# Patient Record
Sex: Female | Born: 1950
Health system: Southern US, Community
[De-identification: ages and names within clinical notes are randomized; demographics above are authoritative.]

## PROBLEM LIST (undated history)

## (undated) DIAGNOSIS — F419 Anxiety disorder, unspecified: Secondary | ICD-10-CM

## (undated) DIAGNOSIS — N649 Disorder of breast, unspecified: Secondary | ICD-10-CM

## (undated) DIAGNOSIS — C439 Malignant melanoma of skin, unspecified: Secondary | ICD-10-CM

## (undated) DIAGNOSIS — I447 Left bundle-branch block, unspecified: Secondary | ICD-10-CM

## (undated) DIAGNOSIS — S50311A Abrasion of right elbow, initial encounter: Secondary | ICD-10-CM

## (undated) DIAGNOSIS — Z9889 Other specified postprocedural states: Secondary | ICD-10-CM

## (undated) DIAGNOSIS — R112 Nausea with vomiting, unspecified: Secondary | ICD-10-CM

## (undated) DIAGNOSIS — J45909 Unspecified asthma, uncomplicated: Secondary | ICD-10-CM

## (undated) DIAGNOSIS — I421 Obstructive hypertrophic cardiomyopathy: Secondary | ICD-10-CM

## (undated) DIAGNOSIS — I10 Essential (primary) hypertension: Secondary | ICD-10-CM

## (undated) DIAGNOSIS — N6452 Nipple discharge: Secondary | ICD-10-CM

## (undated) DIAGNOSIS — E7800 Pure hypercholesterolemia, unspecified: Secondary | ICD-10-CM

## (undated) DIAGNOSIS — G545 Neuralgic amyotrophy: Principal | ICD-10-CM

## (undated) DIAGNOSIS — L989 Disorder of the skin and subcutaneous tissue, unspecified: Secondary | ICD-10-CM

## (undated) HISTORY — DX: Malignant melanoma of skin, unspecified: C43.9

## (undated) HISTORY — DX: Anxiety disorder, unspecified: F41.9

## (undated) HISTORY — PX: BREAST EXCISIONAL BIOPSY: SUR124

## (undated) HISTORY — DX: Obstructive hypertrophic cardiomyopathy: I42.1

## (undated) HISTORY — PX: APPENDECTOMY: SHX54

## (undated) HISTORY — DX: Neuralgic amyotrophy: G54.5

---

## 1998-06-16 ENCOUNTER — Other Ambulatory Visit: Admission: RE | Admit: 1998-06-16 | Discharge: 1998-06-16 | Payer: Self-pay | Admitting: Obstetrics & Gynecology

## 1999-05-10 ENCOUNTER — Encounter: Admission: RE | Admit: 1999-05-10 | Discharge: 1999-05-10 | Payer: Self-pay | Admitting: Obstetrics & Gynecology

## 1999-05-10 ENCOUNTER — Encounter: Payer: Self-pay | Admitting: Obstetrics & Gynecology

## 1999-06-21 ENCOUNTER — Other Ambulatory Visit: Admission: RE | Admit: 1999-06-21 | Discharge: 1999-06-21 | Payer: Self-pay | Admitting: Obstetrics & Gynecology

## 2000-06-14 ENCOUNTER — Encounter: Admission: RE | Admit: 2000-06-14 | Discharge: 2000-06-14 | Payer: Self-pay | Admitting: Internal Medicine

## 2000-06-14 ENCOUNTER — Encounter: Payer: Self-pay | Admitting: Internal Medicine

## 2000-08-15 ENCOUNTER — Other Ambulatory Visit: Admission: RE | Admit: 2000-08-15 | Discharge: 2000-08-15 | Payer: Self-pay | Admitting: Obstetrics & Gynecology

## 2001-06-18 ENCOUNTER — Encounter: Payer: Self-pay | Admitting: Obstetrics & Gynecology

## 2001-06-18 ENCOUNTER — Encounter: Admission: RE | Admit: 2001-06-18 | Discharge: 2001-06-18 | Payer: Self-pay | Admitting: Obstetrics & Gynecology

## 2001-12-18 ENCOUNTER — Other Ambulatory Visit: Admission: RE | Admit: 2001-12-18 | Discharge: 2001-12-18 | Payer: Self-pay | Admitting: Obstetrics & Gynecology

## 2002-12-14 ENCOUNTER — Encounter: Payer: Self-pay | Admitting: Obstetrics & Gynecology

## 2002-12-14 ENCOUNTER — Encounter: Admission: RE | Admit: 2002-12-14 | Discharge: 2002-12-14 | Payer: Self-pay | Admitting: Obstetrics & Gynecology

## 2003-02-25 ENCOUNTER — Other Ambulatory Visit: Admission: RE | Admit: 2003-02-25 | Discharge: 2003-02-25 | Payer: Self-pay | Admitting: Obstetrics & Gynecology

## 2003-07-26 ENCOUNTER — Ambulatory Visit (HOSPITAL_COMMUNITY): Admission: RE | Admit: 2003-07-26 | Discharge: 2003-07-26 | Payer: Self-pay | Admitting: Gastroenterology

## 2004-03-16 ENCOUNTER — Other Ambulatory Visit: Admission: RE | Admit: 2004-03-16 | Discharge: 2004-03-16 | Payer: Self-pay | Admitting: Obstetrics & Gynecology

## 2004-07-21 ENCOUNTER — Encounter (INDEPENDENT_AMBULATORY_CARE_PROVIDER_SITE_OTHER): Payer: Self-pay | Admitting: *Deleted

## 2004-07-21 ENCOUNTER — Ambulatory Visit (HOSPITAL_COMMUNITY): Admission: RE | Admit: 2004-07-21 | Discharge: 2004-07-21 | Payer: Self-pay | Admitting: Obstetrics & Gynecology

## 2004-07-21 HISTORY — PX: HYSTEROSCOPY WITH D & C: SHX1775

## 2005-05-16 ENCOUNTER — Other Ambulatory Visit: Admission: RE | Admit: 2005-05-16 | Discharge: 2005-05-16 | Payer: Self-pay | Admitting: Obstetrics & Gynecology

## 2006-12-31 ENCOUNTER — Encounter: Admission: RE | Admit: 2006-12-31 | Discharge: 2006-12-31 | Payer: Self-pay | Admitting: Obstetrics & Gynecology

## 2007-06-30 ENCOUNTER — Encounter: Admission: RE | Admit: 2007-06-30 | Discharge: 2007-06-30 | Payer: Self-pay | Admitting: Obstetrics & Gynecology

## 2007-07-11 ENCOUNTER — Encounter: Admission: RE | Admit: 2007-07-11 | Discharge: 2007-07-11 | Payer: Self-pay | Admitting: Obstetrics & Gynecology

## 2008-06-08 ENCOUNTER — Encounter: Admission: RE | Admit: 2008-06-08 | Discharge: 2008-06-08 | Payer: Self-pay | Admitting: Obstetrics & Gynecology

## 2008-07-12 ENCOUNTER — Encounter: Admission: RE | Admit: 2008-07-12 | Discharge: 2008-07-12 | Payer: Self-pay | Admitting: Obstetrics & Gynecology

## 2009-07-15 ENCOUNTER — Encounter: Admission: RE | Admit: 2009-07-15 | Discharge: 2009-07-15 | Payer: Self-pay | Admitting: Obstetrics & Gynecology

## 2009-07-21 ENCOUNTER — Encounter: Admission: RE | Admit: 2009-07-21 | Discharge: 2009-07-21 | Payer: Self-pay | Admitting: Obstetrics & Gynecology

## 2010-07-17 ENCOUNTER — Encounter
Admission: RE | Admit: 2010-07-17 | Discharge: 2010-07-17 | Payer: Self-pay | Source: Home / Self Care | Attending: Obstetrics & Gynecology | Admitting: Obstetrics & Gynecology

## 2010-11-24 NOTE — Op Note (Signed)
[name], [name]                         ACCOUNT NO.:  [id number]   MEDICAL RECORD NO.:  [id number]                   PATIENT TYPE:  AMB   LOCATION:  ENDO                                 FACILITY:  [facility]   PHYSICIAN:  Danise Edge, M.D.                DATE OF BIRTH:  07-Jul-1951   DATE OF PROCEDURE:  07/26/2003  DATE OF DISCHARGE:                                 OPERATIVE REPORT   PROCEDURE:  Screening colonoscopy.   PROCEDURE INDICATION:  Janet [name] is a 60 year old female born [birth year].  Janet [name] is scheduled to undergo her first screening colonoscopy  with polypectomy to prevent colon cancer.   ENDOSCOPIST:  Danise Edge, M.D.   PREMEDICATION:  Versed 7 mg, Demerol 50 mg.   DESCRIPTION OF PROCEDURE:  After obtaining informed consent, Janet [name] was  placed in the left lateral decubitus position.  I administered intravenous  Demerol and intravenous Versed to achieve conscious sedation for the  procedure.  The patient's blood pressure, oxygen saturation, and cardiac  rhythm were monitored throughout the procedure and documented in the medical  record.   Anal inspection was normal.  Digital rectal exam was normal.  The Olympus  adjustable pediatric colonoscope was introduced into the rectum and advanced  to the cecum.  A normal-appearing ileocecal valve was intubated and the  distal ileum inspected.  Colonic preparation for the exam today was  excellent.   Rectum normal.   Sigmoid colon and descending colon normal.   Splenic flexure normal.   Transverse colon normal.   Hepatic flexure normal.   Ascending colon normal.   Cecum and ileocecal valve normal.   Distal ileum normal.   ASSESSMENT:  Normal screening proctocolonoscopy to the cecum.  No endoscopic  evidence for the presence of colorectal neoplasia.                                               Danise Edge, M.D.    MJ/MEDQ  D:  07/26/2003  T:  07/26/2003  Job:  [id number]   cc:    Dellis Anes. Idell Pickles, M.D.  [address]  [place]  [place] [address]  Fax: [phone]   W. Varney Baas, M.D.  [address] [place]  [place] [address]  Fax: [phone]   Candyce Churn, M.D.  [address] [place]  [place] [address]  Fax: [phone]

## 2010-11-24 NOTE — Op Note (Signed)
NAMEALLSION, NOGALES               ACCOUNT NO.:  [id number]   MEDICAL RECORD NO.:  [id number]          PATIENT TYPE:  AMB   LOCATION:  SDC                           FACILITY:  [facility]   PHYSICIAN:  Freddy Finner, M.D.   DATE OF BIRTH:  03/12/1951   DATE OF PROCEDURE:  07/21/2004  DATE OF DISCHARGE:                                 OPERATIVE REPORT   PREOPERATIVE DIAGNOSES:  Submucous myoma, endometrial polyp.   POSTOPERATIVE DIAGNOSES:  Submucous myoma, endometrial polyp.   OPERATION:  Hysteroscopy D&C, submucous myomectomy using the single loop  resectoscope.   SURGEON:  Freddy Finner, M.D.   ANESTHESIA:  General.   ESTIMATED BLOOD LOSS:  Less than or equal to 100 mL.   COMPLICATIONS:  None.   SORBITOL DEFICIT:  150 mL.   The patient is the 60 year old wife of Dr. Foye Deer who had dysfunctional  uterine bleeding on hormone replacement therapy and post coital bleeding.  She had a sonohysterogram in the office showing an endometrial polyp and  submucous myoma.  Myoma measured 2 cm x 1.5 cm and polyp measured 9 mm. She  was admitted now for hysteroscopy D&C.   She was admitted on the morning of surgery, she was given a gram of Mefoxin  preoperatively, she was brought to the operating room and there placed under  adequate general anesthesia, placed in the dorsal lithotomy position using  the [place] stirrup system. A Betadine prep of mons, perineum and vagina was  carried out in the standard fashion.  Sterile drapes were applied. A  posterior weighted vaginal retractor was placed, Deaver retractor was used  anteriorly, cervix was visualized, grasped with a single tooth tenaculum.  The cervix was initially dilated to 23 with [facility] dilators. The uterus  sounded to 8.3 cm.  The [facility] 12.5 degree hysteroscope was introduced with a  diagnostic scope only using 3% sorbitol as a distending medium.  The polyp  and fibroid were immediately identified, photographs were made and retained  in the office records as well as a copy sent with the patient's husband.  A  third curettage and exploration with polyp forceps was carried out  initially. This did not allow retrieval of either the polyp or the myoma.  The resectoscope was assembled using a single loop.  With progressive passes  of the loop, the fibroid and polyp were excised from the endometrium,  samples collected and submitted together as one sample.  Photographs  postoperatively of the cavity were carried out. The patient tolerated the  operative procedure well, statistics are noted above.  She was awakened and  taken to the recovery room in good condition and will be discharged in the  immediate postop period for followup in the office in approximately 7-10  days. She was given  routine outpatient surgical instructions. She has hydrocodone at home which  she can use as needed for postoperative pain. She was given Toradol 30 mg IV  and 30 IM immediately on completion of the procedure.  She was taken to the  recovery room in good condition.  W. R   WRN/MEDQ  D:  07/21/2004  T:  07/21/2004  Job:  [id number]

## 2011-06-08 ENCOUNTER — Ambulatory Visit
Admission: RE | Admit: 2011-06-08 | Discharge: 2011-06-08 | Disposition: A | Discharge status: Home / Self Care | Payer: BC Managed Care – PPO | Source: Ambulatory Visit | Attending: Family Medicine | Admitting: Family Medicine

## 2011-06-08 ENCOUNTER — Other Ambulatory Visit: Payer: Self-pay | Admitting: Family Medicine

## 2011-06-08 DIAGNOSIS — M79672 Pain in left foot: Secondary | ICD-10-CM

## 2011-08-03 ENCOUNTER — Other Ambulatory Visit: Payer: Self-pay | Admitting: Obstetrics & Gynecology

## 2011-08-03 DIAGNOSIS — Z1231 Encounter for screening mammogram for malignant neoplasm of breast: Secondary | ICD-10-CM

## 2011-08-06 ENCOUNTER — Ambulatory Visit
Admission: RE | Admit: 2011-08-06 | Discharge: 2011-08-06 | Disposition: A | Discharge status: Home / Self Care | Payer: BC Managed Care – PPO | Source: Ambulatory Visit | Attending: Obstetrics & Gynecology | Admitting: Obstetrics & Gynecology

## 2011-08-06 DIAGNOSIS — Z1231 Encounter for screening mammogram for malignant neoplasm of breast: Secondary | ICD-10-CM

## 2012-10-08 ENCOUNTER — Encounter (HOSPITAL_BASED_OUTPATIENT_CLINIC_OR_DEPARTMENT_OTHER): Payer: Self-pay | Admitting: *Deleted

## 2012-10-13 ENCOUNTER — Encounter (HOSPITAL_BASED_OUTPATIENT_CLINIC_OR_DEPARTMENT_OTHER)
Admission: RE | Admit: 2012-10-13 | Discharge: 2012-10-13 | Disposition: A | Discharge status: Home / Self Care | Payer: BC Managed Care – PPO | Source: Ambulatory Visit | Attending: Orthopedic Surgery | Admitting: Orthopedic Surgery

## 2012-10-13 LAB — BASIC METABOLIC PANEL
BUN: 14 mg/dL (ref 6–23)
CO2: 29 mEq/L (ref 19–32)
Chloride: 101 mEq/L (ref 96–112)
Creatinine, Ser: 0.6 mg/dL (ref 0.50–1.10)
Glucose, Bld: 112 mg/dL — ABNORMAL HIGH (ref 70–99)

## 2012-10-14 ENCOUNTER — Ambulatory Visit (HOSPITAL_BASED_OUTPATIENT_CLINIC_OR_DEPARTMENT_OTHER): Payer: BC Managed Care – PPO | Admitting: Certified Registered Nurse Anesthetist

## 2012-10-14 ENCOUNTER — Encounter (HOSPITAL_BASED_OUTPATIENT_CLINIC_OR_DEPARTMENT_OTHER): Payer: Self-pay | Admitting: Certified Registered Nurse Anesthetist

## 2012-10-14 ENCOUNTER — Encounter (HOSPITAL_BASED_OUTPATIENT_CLINIC_OR_DEPARTMENT_OTHER)
Admission: RE | Disposition: A | Discharge status: Home / Self Care | Payer: Self-pay | Source: Ambulatory Visit | Attending: Orthopedic Surgery

## 2012-10-14 ENCOUNTER — Ambulatory Visit (HOSPITAL_BASED_OUTPATIENT_CLINIC_OR_DEPARTMENT_OTHER)
Admission: RE | Admit: 2012-10-14 | Discharge: 2012-10-14 | Disposition: A | Discharge status: Home / Self Care | Payer: BC Managed Care – PPO | Source: Ambulatory Visit | Attending: Orthopedic Surgery | Admitting: Orthopedic Surgery

## 2012-10-14 DIAGNOSIS — Z887 Allergy status to serum and vaccine status: Secondary | ICD-10-CM | POA: Insufficient documentation

## 2012-10-14 DIAGNOSIS — J45909 Unspecified asthma, uncomplicated: Secondary | ICD-10-CM | POA: Insufficient documentation

## 2012-10-14 DIAGNOSIS — I10 Essential (primary) hypertension: Secondary | ICD-10-CM | POA: Insufficient documentation

## 2012-10-14 DIAGNOSIS — Z6834 Body mass index (BMI) 34.0-34.9, adult: Secondary | ICD-10-CM | POA: Insufficient documentation

## 2012-10-14 DIAGNOSIS — I059 Rheumatic mitral valve disease, unspecified: Secondary | ICD-10-CM | POA: Insufficient documentation

## 2012-10-14 DIAGNOSIS — E7800 Pure hypercholesterolemia, unspecified: Secondary | ICD-10-CM | POA: Insufficient documentation

## 2012-10-14 DIAGNOSIS — Z7982 Long term (current) use of aspirin: Secondary | ICD-10-CM | POA: Insufficient documentation

## 2012-10-14 DIAGNOSIS — D492 Neoplasm of unspecified behavior of bone, soft tissue, and skin: Secondary | ICD-10-CM | POA: Insufficient documentation

## 2012-10-14 DIAGNOSIS — Z79899 Other long term (current) drug therapy: Secondary | ICD-10-CM | POA: Insufficient documentation

## 2012-10-14 DIAGNOSIS — Z85820 Personal history of malignant melanoma of skin: Secondary | ICD-10-CM | POA: Insufficient documentation

## 2012-10-14 HISTORY — PX: NAILBED REPAIR: SHX5028

## 2012-10-14 HISTORY — DX: Unspecified asthma, uncomplicated: J45.909

## 2012-10-14 HISTORY — DX: Essential (primary) hypertension: I10

## 2012-10-14 HISTORY — DX: Pure hypercholesterolemia, unspecified: E78.00

## 2012-10-14 SURGERY — REPAIR, NAIL BED
Anesthesia: General | Site: Thumb | Laterality: Right | Wound class: Clean

## 2012-10-14 MED ORDER — EPHEDRINE SULFATE 50 MG/ML IJ SOLN
INTRAMUSCULAR | Status: DC | PRN
  Administered 2012-10-14: 10 mg via INTRAVENOUS

## 2012-10-14 MED ORDER — HYDROMORPHONE HCL PF 1 MG/ML IJ SOLN
0.2500 mg | INTRAMUSCULAR | Status: DC | PRN
  Administered 2012-10-14 (×2): 0.5 mg via INTRAVENOUS

## 2012-10-14 MED ORDER — OXYCODONE HCL 5 MG/5ML PO SOLN: 5.0000 mg | Freq: Once | ORAL | Status: DC | PRN

## 2012-10-14 MED ORDER — METOCLOPRAMIDE HCL 5 MG/ML IJ SOLN: 10.0000 mg | Freq: Once | INTRAMUSCULAR | Status: DC | PRN

## 2012-10-14 MED ORDER — OXYCODONE HCL 5 MG PO TABS: 5.0000 mg | ORAL_TABLET | Freq: Once | ORAL | Status: DC | PRN

## 2012-10-14 MED ORDER — MIDAZOLAM HCL 5 MG/5ML IJ SOLN
INTRAMUSCULAR | Status: DC | PRN
  Administered 2012-10-14: 1 mg via INTRAVENOUS

## 2012-10-14 MED ORDER — BUPIVACAINE HCL (PF) 0.25 % IJ SOLN
INTRAMUSCULAR | Status: DC | PRN
  Administered 2012-10-14: 7 mL

## 2012-10-14 MED ORDER — CHLORHEXIDINE GLUCONATE 4 % EX LIQD: 60.0000 mL | Freq: Once | CUTANEOUS | Status: DC

## 2012-10-14 MED ORDER — FENTANYL CITRATE 0.05 MG/ML IJ SOLN
INTRAMUSCULAR | Status: DC | PRN
  Administered 2012-10-14: 50 ug via INTRAVENOUS

## 2012-10-14 MED ORDER — PROPOFOL 10 MG/ML IV BOLUS
INTRAVENOUS | Status: DC | PRN
  Administered 2012-10-14: 150 mg via INTRAVENOUS

## 2012-10-14 MED ORDER — DEXAMETHASONE SODIUM PHOSPHATE 10 MG/ML IJ SOLN
INTRAMUSCULAR | Status: DC | PRN
  Administered 2012-10-14: 10 mg via INTRAVENOUS

## 2012-10-14 MED ORDER — LIDOCAINE HCL (CARDIAC) 20 MG/ML IV SOLN
INTRAVENOUS | Status: DC | PRN
  Administered 2012-10-14: 60 mg via INTRAVENOUS

## 2012-10-14 MED ORDER — ONDANSETRON HCL 4 MG/2ML IJ SOLN
INTRAMUSCULAR | Status: DC | PRN
  Administered 2012-10-14: 4 mg via INTRAVENOUS

## 2012-10-14 MED ORDER — CEFAZOLIN SODIUM-DEXTROSE 2-3 GM-% IV SOLR
2.0000 g | INTRAVENOUS | Status: AC
  Administered 2012-10-14: 2 g via INTRAVENOUS

## 2012-10-14 MED ORDER — FENTANYL CITRATE 0.05 MG/ML IJ SOLN: 50.0000 ug | INTRAMUSCULAR | Status: DC | PRN

## 2012-10-14 MED ORDER — LACTATED RINGERS IV SOLN
INTRAVENOUS | Status: DC
  Administered 2012-10-14 (×2): via INTRAVENOUS

## 2012-10-14 MED ORDER — MIDAZOLAM HCL 2 MG/2ML IJ SOLN: 1.0000 mg | INTRAMUSCULAR | Status: DC | PRN

## 2012-10-14 MED ORDER — HYDROCODONE-ACETAMINOPHEN 5-325 MG PO TABS: 1.0000 | ORAL_TABLET | Freq: Four times a day (QID) | ORAL | Status: DC | PRN

## 2012-10-14 SURGICAL SUPPLY — 39 items
BLADE MINI RND TIP GREEN BEAV (BLADE) ×2 IMPLANT
BLADE SURG 15 STRL LF DISP TIS (BLADE) ×1 IMPLANT
BLADE SURG 15 STRL SS (BLADE) ×1
BNDG COHESIVE 1X5 TAN STRL LF (GAUZE/BANDAGES/DRESSINGS) IMPLANT
BNDG ESMARK 4X9 LF (GAUZE/BANDAGES/DRESSINGS) ×2 IMPLANT
CHLORAPREP W/TINT 26ML (MISCELLANEOUS) ×2 IMPLANT
CLOTH BEACON ORANGE TIMEOUT ST (SAFETY) ×2 IMPLANT
CORDS BIPOLAR (ELECTRODE) IMPLANT
COVER MAYO STAND STRL (DRAPES) ×2 IMPLANT
COVER TABLE BACK 60X90 (DRAPES) ×2 IMPLANT
CUFF TOURNIQUET SINGLE 18IN (TOURNIQUET CUFF) ×2 IMPLANT
DRAPE EXTREMITY T 121X128X90 (DRAPE) ×2 IMPLANT
DRAPE SURG 17X23 STRL (DRAPES) ×2 IMPLANT
GAUZE XEROFORM 1X8 LF (GAUZE/BANDAGES/DRESSINGS) ×2 IMPLANT
GLOVE BIO SURGEON STRL SZ 6.5 (GLOVE) ×4 IMPLANT
GLOVE BIOGEL PI IND STRL 8.5 (GLOVE) ×1 IMPLANT
GLOVE BIOGEL PI INDICATOR 8.5 (GLOVE) ×1
GLOVE INDICATOR 6.5 STRL GRN (GLOVE) ×2 IMPLANT
GLOVE INDICATOR 7.0 STRL GRN (GLOVE) ×2 IMPLANT
GLOVE SURG ORTHO 8.0 STRL STRW (GLOVE) ×2 IMPLANT
GOWN BRE IMP PREV XXLGXLNG (GOWN DISPOSABLE) ×2 IMPLANT
GOWN PREVENTION PLUS XLARGE (GOWN DISPOSABLE) ×4 IMPLANT
NEEDLE 27GAX1X1/2 (NEEDLE) ×2 IMPLANT
NS IRRIG 1000ML POUR BTL (IV SOLUTION) ×2 IMPLANT
PACK BASIN DAY SURGERY FS (CUSTOM PROCEDURE TRAY) ×2 IMPLANT
PADDING CAST ABS 4INX4YD NS (CAST SUPPLIES) ×1
PADDING CAST ABS COTTON 4X4 ST (CAST SUPPLIES) ×1 IMPLANT
SPLINT FINGER 5/8X3.25 (SOFTGOODS) ×1 IMPLANT
SPLINT FINGER FOAM 3 9119 05 (SOFTGOODS) ×2
SPONGE GAUZE 4X4 12PLY (GAUZE/BANDAGES/DRESSINGS) ×2 IMPLANT
STOCKINETTE 4X48 STRL (DRAPES) ×2 IMPLANT
SUT CHROMIC 6 0 G 1 (SUTURE) ×2 IMPLANT
SUT VIC AB 4-0 P2 18 (SUTURE) IMPLANT
SUT VICRYL RAPID 5 0 P 3 (SUTURE) IMPLANT
SUT VICRYL RAPIDE 4/0 PS 2 (SUTURE) IMPLANT
SYR BULB 3OZ (MISCELLANEOUS) ×2 IMPLANT
SYR CONTROL 10ML LL (SYRINGE) ×2 IMPLANT
TOWEL OR 17X24 6PK STRL BLUE (TOWEL DISPOSABLE) ×2 IMPLANT
UNDERPAD 30X30 INCONTINENT (UNDERPADS AND DIAPERS) ×2 IMPLANT

## 2012-10-14 NOTE — Anesthesia Procedure Notes (Signed)
Procedure Name: LMA Insertion Date/Time: 10/14/2012 8:38 AM Performed by: Emilian Stawicki D Pre-anesthesia Checklist: Patient identified, Emergency Drugs available, Suction available and Patient being monitored Patient Re-evaluated:Patient Re-evaluated prior to inductionOxygen Delivery Method: Circle System Utilized Preoxygenation: Pre-oxygenation with 100% oxygen Intubation Type: IV induction Ventilation: Mask ventilation without difficulty LMA: LMA inserted LMA Size: 4.0 Number of attempts: 1 Airway Equipment and Method: bite block Placement Confirmation: positive ETCO2 Tube secured with: Tape Dental Injury: Teeth and Oropharynx as per pre-operative assessment

## 2012-10-14 NOTE — H&P (Signed)
Janet [name] is a 62 year old right hand dominant female with a melanotic streaks in her right thumb. There is on radial and one ulnar. She is Janet [name]'s wife. She thinks these have been present since Christmas. She recalls no history of injury. She has a history of melanoma of her upper arm. No history of diabetes, thyroid problems, arthritis or gout.   PAST MEDICAL HISTORY: She has no known drug allergies. She is on Benicar, Lipitor, Lopressor, vitamin D, baby aspirin. She relates no other surgery.  FAMILY H ISTORY: Positive for heart disease, high BP and arthritis.  SOCIAL HISTORY: She does not smoke. She is married and retired.  REVIEW OF SYSTEMS: Negative for 14 points.  Janet [name] is an 62 y.o. female.   Chief Complaint: pigmented lesion rt thumb HPI: see above  Past Medical History  Diagnosis Date  . Hypertension   . Asthma   . MVP (mitral valve prolapse)   . Dysrhythmia     palpitations from MVP  . High cholesterol     takes lipitor    Past Surgical History  Procedure Laterality Date  . Appendectomy      History reviewed. No pertinent family history. Social History:  has no tobacco, alcohol, and drug history on file.  Allergies:  Allergies  Allergen Reactions  . Pneumococcal Vaccines Swelling    Medications Prior to Admission  Medication Sig Dispense Refill  . albuterol (PROVENTIL HFA;VENTOLIN HFA) 108 (90 BASE) MCG/ACT inhaler Inhale 2 puffs into the lungs every 6 (six) hours as needed for wheezing.      Marland Kitchen aspirin EC 81 MG tablet Take 81 mg by mouth daily.      Marland Kitchen atorvastatin (LIPITOR) 40 MG tablet Take 40 mg by mouth daily.      . budesonide (PULMICORT) 180 MCG/ACT inhaler Inhale 1 puff into the lungs 2 (two) times daily.      . calcium-vitamin D (OSCAL WITH D) 500-200 MG-UNIT per tablet Take 1 tablet by mouth daily.      . fish oil-omega-3 fatty acids 1000 MG capsule Take 2 g by mouth daily.      . Flaxseed, Linseed, (FLAX SEED OIL) 1000 MG CAPS Take by  mouth.      . metoprolol succinate (TOPROL-XL) 25 MG 24 hr tablet Take 25 mg by mouth daily.      Marland Kitchen olmesartan-hydrochlorothiazide (BENICAR HCT) 40-12.5 MG per tablet Take 1 tablet by mouth daily.        Results for orders placed during the hospital encounter of 10/14/12 (from the past 48 hour(s))  BASIC METABOLIC PANEL     Status: Abnormal   Collection Time    10/13/12 10:30 AM      Result Value Range   Sodium 139  135 - 145 mEq/L   Potassium 4.1  3.5 - 5.1 mEq/L   Chloride 101  96 - 112 mEq/L   CO2 29  19 - 32 mEq/L   Glucose, Bld 112 (*) 70 - 99 mg/dL   BUN 14  6 - 23 mg/dL   Creatinine, Ser 1.61  0.50 - 1.10 mg/dL   Calcium 9.2  8.4 - 09.6 mg/dL   GFR calc non Af Amer >90  >90 mL/min   GFR calc Af Amer >90  >90 mL/min   Comment:            The eGFR has been calculated     using the CKD EPI equation.     This calculation has not  been     validated in all clinical     situations.     eGFR's persistently     <90 mL/min signify     possible Chronic Kidney Disease.    No results found.   Pertinent items are noted in HPI.  Blood pressure 125/81, pulse 69, temperature 98.4 F (36.9 C), temperature source Oral, resp. rate 20, height 5\' 1"  (1.549 m), weight 82.101 kg (181 lb), SpO2 97.00%.  General appearance: alert, cooperative and appears stated age Head: Normocephalic, without obvious abnormality Neck: no JVD Resp: clear to auscultation bilaterally Cardio: regular rate and rhythm, S1, S2 normal, no murmur, click, rub or gallop GI: soft, non-tender; bowel sounds normal; no masses,  no organomegaly Extremities: extremities normal, atraumatic, no cyanosis or edema Pulses: 2+ and symmetric Skin: Skin color, texture, turgor normal. No rashes or lesions Neurologic: Grossly normal Incision/Wound: na  Assessment/Plan Diagnosis: Melanotic streaks.  They are advised these are generally going to be benign but especially with her history of melanoma these deserve to be  biopsied. The pre, peri and post op course are discussed along with risks and complications.  They would like to proceed. She is seen with her husband Janet Dowse.She is scheduled  for biopsy melanotic strips on her right thumb.   Baldemar Dady R 10/14/2012, 7:40 AM

## 2012-10-14 NOTE — Brief Op Note (Signed)
10/14/2012  9:08 AM  PATIENT:  [name]  62 y.o. female  PRE-OPERATIVE DIAGNOSIS:  PIGMENTED STRIPS RIGHT THUMB  POST-OPERATIVE DIAGNOSIS:  PIGMENTED STRIPS RIGHT THUMB  PROCEDURE:  Procedure(s): BIOPSY NAIL MATRIX RIGHT THUMB (Right)  SURGEON:  Surgeon(s) and Role:    * Nicki Reaper, MD - Primary  PHYSICIAN ASSISTANT:   ASSISTANTS: none   ANESTHESIA:   local and general  EBL:  Total I/O In: 1200 [I.V.:1200] Out: -   BLOOD ADMINISTERED:none  DRAINS: none   LOCAL MEDICATIONS USED:  MARCAINE     SPECIMEN:  Excision  DISPOSITION OF SPECIMEN:  PATHOLOGY  COUNTS:  YES  TOURNIQUET:  * Missing tourniquet times found for documented tourniquets in log:  [id number] *  DICTATION: .Other Dictation: Dictation Number [id number]  PLAN OF CARE: Discharge to home after PACU  PATIENT DISPOSITION:  PACU - hemodynamically stable.

## 2012-10-14 NOTE — Transfer of Care (Signed)
Immediate Anesthesia Transfer of Care Note  Patient: Janet [name]  Procedure(s) Performed: Procedure(s): BIOPSY NAIL MATRIX RIGHT THUMB (Right)  Patient Location: PACU  Anesthesia Type:General  Level of Consciousness: awake, alert , oriented and patient cooperative  Airway & Oxygen Therapy: Patient Spontanous Breathing and Patient connected to face mask oxygen  Post-op Assessment: Report given to PACU RN and Post -op Vital signs reviewed and stable  Post vital signs: Reviewed and stable  Complications: No apparent anesthesia complications

## 2012-10-14 NOTE — Op Note (Signed)
Dictation Number [id number]

## 2012-10-14 NOTE — Anesthesia Preprocedure Evaluation (Addendum)
Anesthesia Evaluation  Patient identified by MRN, date of birth, ID band Patient awake    Reviewed: Allergy & Precautions, H&P , NPO status , Patient's Chart, lab work & pertinent test results, reviewed documented beta blocker date and time   Airway Mallampati: II TM Distance: >3 FB Neck ROM: full    Dental   Pulmonary asthma ,  breath sounds clear to auscultation        Cardiovascular hypertension, Pt. on home beta blockers and On Medications + dysrhythmias Rhythm:regular     Neuro/Psych negative neurological ROS  negative psych ROS   GI/Hepatic negative GI ROS, Neg liver ROS,   Endo/Other  Morbid obesity  Renal/GU negative Renal ROS  negative genitourinary   Musculoskeletal   Abdominal   Peds  Hematology negative hematology ROS (+)   Anesthesia Other Findings See surgeon's H&P   Reproductive/Obstetrics negative OB ROS                           Anesthesia Physical Anesthesia Plan  ASA: III  Anesthesia Plan: General   Post-op Pain Management:    Induction: Intravenous  Airway Management Planned: LMA  Additional Equipment:   Intra-op Plan:   Post-operative Plan:   Informed Consent: I have reviewed the patients History and Physical, chart, labs and discussed the procedure including the risks, benefits and alternatives for the proposed anesthesia with the patient or authorized representative who has indicated his/her understanding and acceptance.   Dental Advisory Given  Plan Discussed with: CRNA and Surgeon  Anesthesia Plan Comments:         Anesthesia Quick Evaluation

## 2012-10-14 NOTE — Anesthesia Postprocedure Evaluation (Signed)
Anesthesia Post Note  Patient: Janet [name]  Procedure(s) Performed: Procedure(s) (LRB): BIOPSY NAIL MATRIX RIGHT THUMB (Right)  Anesthesia type: General  Patient location: PACU  Post pain: Pain level controlled  Post assessment: Patient's Cardiovascular Status Stable  Last Vitals:  Filed Vitals:   10/14/12 1000  BP: 117/60  Pulse: 77  Temp:   Resp: 10    Post vital signs: Reviewed and stable  Level of consciousness: alert  Complications: No apparent anesthesia complications

## 2012-10-15 NOTE — Op Note (Signed)
[name], Janet               ACCOUNT NO.:  [id number]  MEDICAL RECORD NO.:  [id number]  LOCATION:                               FACILITY:  [facility]  PHYSICIAN:  Cindee Salt, M.D.       DATE OF BIRTH:  1950-07-24  DATE OF PROCEDURE:  10/14/2012 DATE OF DISCHARGE:  10/14/2012                              OPERATIVE REPORT   PREOPERATIVE DIAGNOSIS:  Pigmented lesion, right thumb nail.  POSTOPERATIVE DIAGNOSIS:  Pigmented lesion, right thumb nail.  OPERATION:  Excisional biopsy of pigmented lesion, thumb nail matrix, right thumb.  SURGEON:  Cindee Salt, M.D.  ANESTHESIA:  General with metacarpal block.  ANESTHESIOLOGIST:  Janetta Hora. Gelene Mink, M.D.  HISTORY:  The patient is a 62 year old female with a history of melanoma.  She has developed a melanotic stripe going down her right thumb nail matrix, it has been present for at least 6 months.  She is desirous of having this biopsy.  Pre, peri, and postoperative course have been discussed along with risks and complications.  She is aware of the possibility of deformity to the nail; infection; recurrence of injury to arteries, nerves, tendons; and possibility that this could be a melanoma.  In the preoperative area, the patient is seen, the extremity marked by both patient and surgeon, and antibiotic given.  PROCEDURE:  The patient was brought to the operating room where a general anesthetic was carried out without difficulty.  She was prepped using ChloraPrep, supine position, right arm free.  A 3-minute dry time was allowed.  Time-out taken, confirming the patient and procedure.  The stripe in the nail plate was marked proximally for localization, the nail plate was removed.  A small pigmented area was present, this was excised with elliptical incision, removing the entire specimen full thickness of the nail matrix.  The wound was irrigated.  The specimen was sent to Pathology and the wound closed with interrupted 6-0  chromic sutures.  A metacarpal block was then given with 0.25% Marcaine without epinephrine, 8 mL was used.  A sterile compressive dressing was applied after application of a nonadherent gauze in the nail fold proximally. The patient tolerated the procedure well on deflation of the tourniquet, which was inflated to 250 mmHg, placed on the upper arm with the limb exsanguinated with an Esmarch bandage from the wrist proximal.  All fingers were pinked and she was taken to the recovery room for observation in satisfactory condition.  She will be discharged to home to return to the [facility] [place] in 1 week, on [place].          ______________________________ Cindee Salt, M.D.     GK/MEDQ  D:  10/14/2012  T:  10/14/2012  Job:  [id number]

## 2013-07-09 HISTORY — PX: INFERIOR OBLIQUE MYECTOMY: SHX1814

## 2013-10-30 ENCOUNTER — Ambulatory Visit (HOSPITAL_COMMUNITY): Payer: BC Managed Care – PPO | Attending: Cardiology | Admitting: Cardiology

## 2013-10-30 ENCOUNTER — Other Ambulatory Visit (HOSPITAL_COMMUNITY): Payer: BC Managed Care – PPO

## 2013-10-30 DIAGNOSIS — I059 Rheumatic mitral valve disease, unspecified: Secondary | ICD-10-CM | POA: Insufficient documentation

## 2013-10-30 DIAGNOSIS — R0609 Other forms of dyspnea: Secondary | ICD-10-CM

## 2013-10-30 DIAGNOSIS — R0602 Shortness of breath: Secondary | ICD-10-CM | POA: Insufficient documentation

## 2013-10-30 DIAGNOSIS — R0989 Other specified symptoms and signs involving the circulatory and respiratory systems: Secondary | ICD-10-CM

## 2013-10-30 NOTE — Progress Notes (Signed)
Echo completed

## 2013-11-04 ENCOUNTER — Encounter: Payer: Self-pay | Admitting: Cardiology

## 2013-11-05 ENCOUNTER — Other Ambulatory Visit: Payer: Self-pay | Admitting: Obstetrics & Gynecology

## 2013-11-05 DIAGNOSIS — R928 Other abnormal and inconclusive findings on diagnostic imaging of breast: Secondary | ICD-10-CM

## 2013-11-06 ENCOUNTER — Encounter: Payer: Self-pay | Admitting: Cardiology

## 2013-11-06 ENCOUNTER — Ambulatory Visit (INDEPENDENT_AMBULATORY_CARE_PROVIDER_SITE_OTHER): Payer: BC Managed Care – PPO | Admitting: Cardiology

## 2013-11-06 ENCOUNTER — Other Ambulatory Visit: Payer: Self-pay | Admitting: *Deleted

## 2013-11-06 VITALS — BP 126/60 | HR 88 | Ht 61.0 in | Wt 181.2 lb

## 2013-11-06 DIAGNOSIS — I4949 Other premature depolarization: Secondary | ICD-10-CM

## 2013-11-06 DIAGNOSIS — I499 Cardiac arrhythmia, unspecified: Secondary | ICD-10-CM | POA: Insufficient documentation

## 2013-11-06 DIAGNOSIS — I493 Ventricular premature depolarization: Secondary | ICD-10-CM

## 2013-11-06 DIAGNOSIS — I340 Nonrheumatic mitral (valve) insufficiency: Secondary | ICD-10-CM | POA: Insufficient documentation

## 2013-11-06 DIAGNOSIS — I059 Rheumatic mitral valve disease, unspecified: Secondary | ICD-10-CM

## 2013-11-06 DIAGNOSIS — I10 Essential (primary) hypertension: Secondary | ICD-10-CM

## 2013-11-06 DIAGNOSIS — I341 Nonrheumatic mitral (valve) prolapse: Secondary | ICD-10-CM | POA: Insufficient documentation

## 2013-11-06 NOTE — Patient Instructions (Signed)
Stop metoprolol  We will schedule you for a transesophageal echocardiogram

## 2013-11-07 ENCOUNTER — Other Ambulatory Visit: Payer: Self-pay | Admitting: Cardiology

## 2013-11-07 ENCOUNTER — Encounter: Payer: Self-pay | Admitting: Cardiology

## 2013-11-07 DIAGNOSIS — I493 Ventricular premature depolarization: Secondary | ICD-10-CM | POA: Insufficient documentation

## 2013-11-07 DIAGNOSIS — I341 Nonrheumatic mitral (valve) prolapse: Secondary | ICD-10-CM

## 2013-11-07 NOTE — Progress Notes (Signed)
 Janet [name] Date of Birth: 09/03/1950 Medical Record #[id number]  History of Present Illness: Janet [name] is seen at the request of Dr. Gates for evaluation of MV prolapse. She is a pleasant 63 yo WF. Her husband Joel is a retired family practice physician here. She has a long history of MV prolapse for the past 20 years diagnosed with a murmur. She has done well until about a year ago when she noticed a significant increase in palpitations with predominant ectopic beats. This was bothersome for her and caused her to feel quite anxious. She was started on metoprolol with improvement in her symptoms. Since then, however, she has noted progressive symptoms of dyspnea on exertion. This occurs when she walks on the track or up steps. She denies any chest pain. Palpitations have also returned and now she is not sure the metoprolol is helping. She does have a history of asthma but denies any recent wheezing or cough. Her dyspnea resolves readily with rest. Echo in 2013 showed normal LV function with basal septal hypertrophy. There was moderate posterior valve prolapse with mild to moderate MR. More recent Echo noted below.    Current Outpatient Prescriptions on File Prior to Visit  Medication Sig Dispense Refill  . albuterol (PROVENTIL HFA;VENTOLIN HFA) 108 (90 BASE) MCG/ACT inhaler Inhale 2 puffs into the lungs every 6 (six) hours as needed for wheezing.      . aspirin EC 81 MG tablet Take 81 mg by mouth daily.      . atorvastatin (LIPITOR) 40 MG tablet Take 40 mg by mouth daily.      . budesonide (PULMICORT) 180 MCG/ACT inhaler Inhale 1 puff into the lungs 2 (two) times daily.      . fish oil-omega-3 fatty acids 1000 MG capsule Take 2 g by mouth daily.      . Flaxseed, Linseed, (FLAX SEED OIL) 1000 MG CAPS Take by mouth.      . metoprolol succinate (TOPROL-XL) 25 MG 24 hr tablet Take 25 mg by mouth daily.       No current facility-administered medications on file prior to visit.    Allergies    Allergen Reactions  . Pneumococcal Vaccines Swelling    Past Medical History  Diagnosis Date  . Hypertension   . Asthma   . MVP (mitral valve prolapse)   . Dysrhythmia     palpitations from MVP  . High cholesterol     takes lipitor  . Mitral insufficiency   . PVC's (premature ventricular contractions)     Past Surgical History  Procedure Laterality Date  . Appendectomy    . Nailbed repair Right 10/14/2012    Procedure: BIOPSY NAIL MATRIX RIGHT THUMB;  Surgeon: Gary R Kuzma, MD;  Location: [facility];  Service: Orthopedics;  Laterality: Right;    History  Smoking status  . Former Smoker  Smokeless tobacco  . Not on file    History  Alcohol Use: Not on file    Family History  Problem Relation Age of Onset  . Arrhythmia Father   . Arrhythmia Brother   . Heart disease Brother     SBE, MV repair    Review of Systems: As noted in HPI.  All other systems were reviewed and are negative.  Physical Exam: BP 126/60  Pulse 88  Ht 5' 1" (1.549 m)  Wt 181 lb 3.2 oz (82.192 kg)  BMI 34.26 kg/m2 She is a pleasant overweight WF in NAD.  HEENT: [place]/AT, PERRL,   EOMI, sclera are clear. Oropharynx is clear. Neck: No JVD, adenopathy, thyromegaly, or bruits.  Lungs: clear CV: RRR, normal S1-S2. There is a harsh 3/6 holosystolic murmur with late peaking noted in the apex and radiating to the left axilla and across the precordium Abd: soft, NT. BS +. No masses or HSM Ext: no cyanosis or edema. Pulses 2+ Skin: warm and dry. No rashes. Neuro: alert and oriented x 3. CN II-XII intact. Nonfocal.  LABORATORY DATA: Ecg: NSR with first degree AV block and PVCs, PACs. Rate 88 bpm. Nonspecific ST abnormality.   Echo: 10/30/13:Transthoracic Echocardiography  Patient: Janet [name], Janet [name] MR #: [id number] Study Date: 10/30/2013 Gender: F Age: 39 Height: 154.9cm Weight: 81.6kg BSA: 1.4m^2 Pt. Status: Room:  ATTENDING Fransico Him, MD Livia Snellen Gates, [place], Angela Cox REFERRING Gates SONOGRAPHER Kathleene Hazel, RDCS PERFORMING [facility], Outpatient cc:  ------------------------------------------------------------ LV EF: 60% - 65%  ------------------------------------------------------------ History: PMH: MVP with MR, DOE for the past 3-4 months Dyspnea. Mitral valve disease. Mitral valve prolapse.  ------------------------------------------------------------ Study Conclusions  - Left ventricle: The cavity size was normal. There was moderate focal basal and mild concentric hypertrophy. Systolic function was normal. The estimated ejection fraction was in the range of 60% to 65%. Wall motion was normal; there were no regional wall motion abnormalities. The outflow tract showed a velocity flow profile with a late systolic peak, typical of dynamic obstruction. - Mitral valve: Moderate, holosystolicprolapse, involving the posterior leaflet. Moderate regurgitation directed eccentrically. - Left atrium: The atrium was mildly dilated. - Atrial septum: There was increased thickness of the septum, consistent with lipomatous hypertrophy. No defect or patent foramen ovale was identified. Transthoracic echocardiography. M-mode, complete 2D, spectral Doppler, and color Doppler. Height: Height: 154.9cm. Height: 61in. Weight: Weight: 81.6kg. Weight: 179.6lb. Body mass index: BMI: 34kg/m^2. Body surface area: BSA: 1.79m^2. Blood pressure: 145/84. Patient status: Outpatient.  ------------------------------------------------------------  ------------------------------------------------------------ Left ventricle: There is turbulence in the LVOT with probable chordal SAM The cavity size was normal. There was moderate focal basal and mild concentric hypertrophy. Systolic function was normal. The estimated ejection fraction was in the range of 60% to 65%. Wall motion was normal; there were no regional wall motion  abnormalities. The outflow tract showed a velocity flow profile with a late systolic peak, typical of dynamic obstruction.  ------------------------------------------------------------ Aortic valve: Structurally normal valve. Cusp separation was normal. Doppler: Transvalvular velocity was within the normal range. There was no stenosis. No regurgitation.  ------------------------------------------------------------ Aorta: The aorta was normal, not dilated, and non-diseased.  ------------------------------------------------------------ Mitral valve: Moderate, holosystolicprolapse, involving the posterior leaflet. Doppler: Moderate regurgitation directed eccentrically. Peak gradient: 81mm Hg (D).  ------------------------------------------------------------ Left atrium: The atrium was mildly dilated.  ------------------------------------------------------------ Atrial septum: There was increased thickness of the septum, consistent with lipomatous hypertrophy. No defect or patent foramen ovale was identified.  ------------------------------------------------------------ Right ventricle: The cavity size was normal. Wall thickness was normal. Systolic function was normal.  ------------------------------------------------------------ Pulmonic valve: Structurally normal valve. Cusp separation was normal. Doppler: Transvalvular velocity was within the normal range. Mild regurgitation.  ------------------------------------------------------------ Tricuspid valve: Structurally normal valve. Leaflet separation was normal. Doppler: Transvalvular velocity was within the normal range. Mild regurgitation.  ------------------------------------------------------------ Right atrium: The atrium was normal in size.  ------------------------------------------------------------ Pericardium: The pericardium was normal in  appearance.  ------------------------------------------------------------ Systemic veins: Inferior vena cava: The vessel was normal in size; the respirophasic diameter changes were in the normal range (= 50%); findings are consistent with normal central venous pressure.  ------------------------------------------------------------ Post procedure conclusions  Ascending Aorta:  - The aorta was normal, not dilated, and non-diseased.  ------------------------------------------------------------  2D measurements Normal Doppler measurements Normal Left ventricle Mitral valve LVID ED, 45.6 mm 43-52 Peak E vel 107 cm/s ------ chord, Peak A vel 72. cm/s ------ PLAX 1 LVID ES, 29.4 mm 23-38 Deceleration 250 ms 150-23 chord, time 0 PLAX Peak 5 mm ------ FS, chord, 36 % >29 gradient, D Hg PLAX Peak E/A 1.5 ------ LVPW, ED 14.6 mm ------ ratio IVS/LVPW 0.95 <1.3 Tricuspid valve ratio, ED Regurg peak 215 cm/s ------ Ventricular septum vel IVS, ED 13.8 mm ------ Peak RV-RA 18 mm ------ LVOT gradient, S Hg Diam, S 19 mm ------ Pulmonic valve Area 2.84 cm^2 ------ Regurg vel, 119 cm/s ------ Aorta ED Root diam, 24 mm ------ ED Left atrium AP dim 44 mm ------ AP dim 2.43 cm/m^2 <2.2 index Right ventricle RVID ED, 17.9 mm 19-38 PLAX  ------------------------------------------------------------ Prepared and Electronically Authenticated by  Traci Turner, MD 2015-04-24T17:[id number]   Assessment / Plan: 1. MV prolapse with mitral insufficiency. I reviewed Echo images. She has predominant posterior prolapse with eccentric jet of MR anteriorly. There is moderate MR. I am concerned that with her symptoms we may be underestimating the degree of MR. Her murmur certainly sounds worse but may be confounded by SAM with dynamic outflow obstruction. I have recommended a TEE to better evaluate her valve morphology and degree of insufficiency. If her MR is more severe we may need to consider repair  given her symptoms of dyspnea.  2. Dyspnea on exertion. Multiple potential etiologies. I am most concerned about the severity of the MR. Asthma may be playing a role and may be exacerbated by metoprolol. She would like to stop metoprolol at this point to see if her symptoms improve. If so a more selective beta blocker like bisoprolol may be better. Also need to consider the possibility of ischemia with her risk factors. If her TEE shows that the MR is not so severe then would consider a stress Echo to rule out ischemic heart disease and to assess MR and right heart pressures with exertion.  3. Palpitations with PVCs and PACs noted on monitor. Will hold metoprolol for now as noted above. Consider more selective beta blocker. If unable to manage symptoms may need to consider antiarrhythmic drug therapy but I would try and avoid with risk of proarrhythmia.   4. HTN continue Avapro.  

## 2013-11-10 ENCOUNTER — Ambulatory Visit (HOSPITAL_COMMUNITY)
Admission: RE | Admit: 2013-11-10 | Discharge: 2013-11-10 | Disposition: A | Discharge status: Home / Self Care | Payer: BC Managed Care – PPO | Source: Ambulatory Visit | Attending: Cardiology | Admitting: Cardiology

## 2013-11-10 ENCOUNTER — Encounter (HOSPITAL_COMMUNITY)
Admission: RE | Disposition: A | Discharge status: Home / Self Care | Payer: Self-pay | Source: Ambulatory Visit | Attending: Cardiology

## 2013-11-10 ENCOUNTER — Encounter (HOSPITAL_COMMUNITY): Payer: Self-pay

## 2013-11-10 DIAGNOSIS — I10 Essential (primary) hypertension: Secondary | ICD-10-CM | POA: Insufficient documentation

## 2013-11-10 DIAGNOSIS — I2789 Other specified pulmonary heart diseases: Secondary | ICD-10-CM | POA: Insufficient documentation

## 2013-11-10 DIAGNOSIS — I059 Rheumatic mitral valve disease, unspecified: Secondary | ICD-10-CM

## 2013-11-10 DIAGNOSIS — Z7982 Long term (current) use of aspirin: Secondary | ICD-10-CM | POA: Insufficient documentation

## 2013-11-10 DIAGNOSIS — I341 Nonrheumatic mitral (valve) prolapse: Secondary | ICD-10-CM

## 2013-11-10 DIAGNOSIS — I079 Rheumatic tricuspid valve disease, unspecified: Secondary | ICD-10-CM | POA: Insufficient documentation

## 2013-11-10 DIAGNOSIS — J45909 Unspecified asthma, uncomplicated: Secondary | ICD-10-CM | POA: Insufficient documentation

## 2013-11-10 DIAGNOSIS — I4949 Other premature depolarization: Secondary | ICD-10-CM | POA: Insufficient documentation

## 2013-11-10 DIAGNOSIS — I517 Cardiomegaly: Secondary | ICD-10-CM | POA: Insufficient documentation

## 2013-11-10 DIAGNOSIS — E7800 Pure hypercholesterolemia, unspecified: Secondary | ICD-10-CM | POA: Insufficient documentation

## 2013-11-10 HISTORY — PX: TEE WITHOUT CARDIOVERSION: SHX5443

## 2013-11-10 SURGERY — ECHOCARDIOGRAM, TRANSESOPHAGEAL
Anesthesia: Moderate Sedation

## 2013-11-10 MED ORDER — FENTANYL CITRATE 0.05 MG/ML IJ SOLN
INTRAMUSCULAR | Status: AC
  Filled 2013-11-10: qty 2

## 2013-11-10 MED ORDER — MIDAZOLAM HCL 5 MG/ML IJ SOLN
INTRAMUSCULAR | Status: AC
  Filled 2013-11-10: qty 1

## 2013-11-10 MED ORDER — MIDAZOLAM HCL 10 MG/2ML IJ SOLN
INTRAMUSCULAR | Status: DC | PRN
  Administered 2013-11-10 (×2): 2 mg via INTRAVENOUS
  Administered 2013-11-10: 1 mg via INTRAVENOUS

## 2013-11-10 MED ORDER — SODIUM CHLORIDE 0.9 % IV SOLN
INTRAVENOUS | Status: DC
  Administered 2013-11-10: 500 mL via INTRAVENOUS

## 2013-11-10 MED ORDER — FENTANYL CITRATE 0.05 MG/ML IJ SOLN
INTRAMUSCULAR | Status: DC | PRN
  Administered 2013-11-10 (×3): 25 ug via INTRAVENOUS

## 2013-11-10 MED ORDER — BUTAMBEN-TETRACAINE-BENZOCAINE 2-2-14 % EX AERO
INHALATION_SPRAY | CUTANEOUS | Status: DC | PRN
  Administered 2013-11-10 (×2): 1 via TOPICAL

## 2013-11-10 NOTE — Interval H&P Note (Signed)
History and Physical Interval Note:  11/10/2013 7:34 AM  Janet [name]  has presented today for surgery, with the diagnosis of MITRAL VALVE PROLAPSE  The various methods of treatment have been discussed with the patient and family. After consideration of risks, benefits and other options for treatment, the patient has consented to  Procedure(s): TRANSESOPHAGEAL ECHOCARDIOGRAM (TEE) (N/A) as a surgical intervention .  The patient's history has been reviewed, patient examined, no change in status, stable for surgery.  I have reviewed the patient's chart and labs.  Questions were answered to the patient's satisfaction.     Dorothy Spark

## 2013-11-10 NOTE — CV Procedure (Deleted)
Entered in error

## 2013-11-10 NOTE — CV Procedure (Signed)
Procedure: TEE  Indication: Mitral regurgitation  Sedation: Versed 5 mg IV, Fentanyl 75 mcg IV  Findings: Please see echo section for full report.  The left ventricle was normal in size with moderate focal basal septal hypertrophy.  EF 60-65%.  The LV outflow tract was narrow.  There was turbulent flow in the LV outflow tract.  There was systolic anterior motion of the mitral valve.  Peak LVOT gradient was 88 mmHg.  There was bileaflet mitral valve prolapse in addition to anterior leaflet SAM.  There did appear to be severe mitral regurgitation (though there was not systolic flow reversal in the pulmonary vein doppler pattern).  The aortic valve was trileaflet without significant stenosis or regurgitation.  The RV was normal in size and systolic function.   Impression: Severe MR in the setting of both mitral valve prolapse and systolic anterior motion of the mitral valve. There was moderate focal basal septal hypertrophy with narrow LVOT and peak LVOT gradient 88 mmHg.   This is likely causing her dyspnea.  Would give trial of medical therapy (CCB, BB) to decreased LVOT gradient and SAM initially.   Larey Dresser 11/10/2013 9:49 AM

## 2013-11-10 NOTE — H&P (View-Only) (Signed)
[name] Date of Birth: 15-Feb-1951 Medical Record #[id number]  History of Present Illness: Janet [name] is seen at the request of Dr. Inda Merlin for evaluation of MV prolapse. She is a pleasant 63 yo WF. Her husband Fara Olden is a retired family Engineer, petroleum here. She has a long history of MV prolapse for the past 20 years diagnosed with a murmur. She has done well until about a year ago when she noticed a significant increase in palpitations with predominant ectopic beats. This was bothersome for her and caused her to feel quite anxious. She was started on metoprolol with improvement in her symptoms. Since then, however, she has noted progressive symptoms of dyspnea on exertion. This occurs when she walks on the track or up steps. She denies any chest pain. Palpitations have also returned and now she is not sure the metoprolol is helping. She does have a history of asthma but denies any recent wheezing or cough. Her dyspnea resolves readily with rest. Echo in 2013 showed normal LV function with basal septal hypertrophy. There was moderate posterior valve prolapse with mild to moderate MR. More recent Echo noted below.    Current Outpatient Prescriptions on File Prior to Visit  Medication Sig Dispense Refill  . albuterol (PROVENTIL HFA;VENTOLIN HFA) 108 (90 BASE) MCG/ACT inhaler Inhale 2 puffs into the lungs every 6 (six) hours as needed for wheezing.      Marland Kitchen aspirin EC 81 MG tablet Take 81 mg by mouth daily.      Marland Kitchen atorvastatin (LIPITOR) 40 MG tablet Take 40 mg by mouth daily.      . budesonide (PULMICORT) 180 MCG/ACT inhaler Inhale 1 puff into the lungs 2 (two) times daily.      . fish oil-omega-3 fatty acids 1000 MG capsule Take 2 g by mouth daily.      . Flaxseed, Linseed, (FLAX SEED OIL) 1000 MG CAPS Take by mouth.      . metoprolol succinate (TOPROL-XL) 25 MG 24 hr tablet Take 25 mg by mouth daily.       No current facility-administered medications on file prior to visit.    Allergies    Allergen Reactions  . Pneumococcal Vaccines Swelling    Past Medical History  Diagnosis Date  . Hypertension   . Asthma   . MVP (mitral valve prolapse)   . Dysrhythmia     palpitations from MVP  . High cholesterol     takes lipitor  . Mitral insufficiency   . PVC's (premature ventricular contractions)     Past Surgical History  Procedure Laterality Date  . Appendectomy    . Nailbed repair Right 10/14/2012    Procedure: BIOPSY NAIL MATRIX RIGHT THUMB;  Surgeon: Wynonia Sours, MD;  Location: [facility];  Service: Orthopedics;  Laterality: Right;    History  Smoking status  . Former Smoker  Smokeless tobacco  . Not on file    History  Alcohol Use: Not on file    Family History  Problem Relation Age of Onset  . Arrhythmia Father   . Arrhythmia Brother   . Heart disease Brother     SBE, MV repair    Review of Systems: As noted in HPI.  All other systems were reviewed and are negative.  Physical Exam: BP 126/60  Pulse 88  Ht 5\' 1"  (1.549 m)  Wt 181 lb 3.2 oz (82.192 kg)  BMI 34.26 kg/m2 She is a pleasant overweight WF in NAD.  HEENT: [place]/AT, PERRL,  EOMI, sclera are clear. Oropharynx is clear. Neck: No JVD, adenopathy, thyromegaly, or bruits.  Lungs: clear CV: RRR, normal S1-S2. There is a harsh 3/6 holosystolic murmur with late peaking noted in the apex and radiating to the left axilla and across the precordium Abd: soft, NT. BS +. No masses or HSM Ext: no cyanosis or edema. Pulses 2+ Skin: warm and dry. No rashes. Neuro: alert and oriented x 3. CN II-XII intact. Nonfocal.  LABORATORY DATA: Ecg: NSR with first degree AV block and PVCs, PACs. Rate 88 bpm. Nonspecific ST abnormality.   Echo: 10/30/13:Transthoracic Echocardiography  Patient: Kelsa, Jaworowski MR #: [id number] Study Date: 10/30/2013 Gender: F Age: 39 Height: 154.9cm Weight: 81.6kg BSA: 1.4m^2 Pt. Status: Room:  ATTENDING Fransico Him, MD Livia Snellen Gates, [place], Angela Cox REFERRING Gates SONOGRAPHER Kathleene Hazel, RDCS PERFORMING [facility], Outpatient cc:  ------------------------------------------------------------ LV EF: 60% - 65%  ------------------------------------------------------------ History: PMH: MVP with MR, DOE for the past 3-4 months Dyspnea. Mitral valve disease. Mitral valve prolapse.  ------------------------------------------------------------ Study Conclusions  - Left ventricle: The cavity size was normal. There was moderate focal basal and mild concentric hypertrophy. Systolic function was normal. The estimated ejection fraction was in the range of 60% to 65%. Wall motion was normal; there were no regional wall motion abnormalities. The outflow tract showed a velocity flow profile with a late systolic peak, typical of dynamic obstruction. - Mitral valve: Moderate, holosystolicprolapse, involving the posterior leaflet. Moderate regurgitation directed eccentrically. - Left atrium: The atrium was mildly dilated. - Atrial septum: There was increased thickness of the septum, consistent with lipomatous hypertrophy. No defect or patent foramen ovale was identified. Transthoracic echocardiography. M-mode, complete 2D, spectral Doppler, and color Doppler. Height: Height: 154.9cm. Height: 61in. Weight: Weight: 81.6kg. Weight: 179.6lb. Body mass index: BMI: 34kg/m^2. Body surface area: BSA: 1.79m^2. Blood pressure: 145/84. Patient status: Outpatient.  ------------------------------------------------------------  ------------------------------------------------------------ Left ventricle: There is turbulence in the LVOT with probable chordal SAM The cavity size was normal. There was moderate focal basal and mild concentric hypertrophy. Systolic function was normal. The estimated ejection fraction was in the range of 60% to 65%. Wall motion was normal; there were no regional wall motion  abnormalities. The outflow tract showed a velocity flow profile with a late systolic peak, typical of dynamic obstruction.  ------------------------------------------------------------ Aortic valve: Structurally normal valve. Cusp separation was normal. Doppler: Transvalvular velocity was within the normal range. There was no stenosis. No regurgitation.  ------------------------------------------------------------ Aorta: The aorta was normal, not dilated, and non-diseased.  ------------------------------------------------------------ Mitral valve: Moderate, holosystolicprolapse, involving the posterior leaflet. Doppler: Moderate regurgitation directed eccentrically. Peak gradient: 81mm Hg (D).  ------------------------------------------------------------ Left atrium: The atrium was mildly dilated.  ------------------------------------------------------------ Atrial septum: There was increased thickness of the septum, consistent with lipomatous hypertrophy. No defect or patent foramen ovale was identified.  ------------------------------------------------------------ Right ventricle: The cavity size was normal. Wall thickness was normal. Systolic function was normal.  ------------------------------------------------------------ Pulmonic valve: Structurally normal valve. Cusp separation was normal. Doppler: Transvalvular velocity was within the normal range. Mild regurgitation.  ------------------------------------------------------------ Tricuspid valve: Structurally normal valve. Leaflet separation was normal. Doppler: Transvalvular velocity was within the normal range. Mild regurgitation.  ------------------------------------------------------------ Right atrium: The atrium was normal in size.  ------------------------------------------------------------ Pericardium: The pericardium was normal in  appearance.  ------------------------------------------------------------ Systemic veins: Inferior vena cava: The vessel was normal in size; the respirophasic diameter changes were in the normal range (= 50%); findings are consistent with normal central venous pressure.  ------------------------------------------------------------ Post procedure conclusions  Ascending Aorta:  - The aorta was normal, not dilated, and non-diseased.  ------------------------------------------------------------  2D measurements Normal Doppler measurements Normal Left ventricle Mitral valve LVID ED, 45.6 mm 43-52 Peak E vel 107 cm/s ------ chord, Peak A vel 72. cm/s ------ PLAX 1 LVID ES, 29.4 mm 23-38 Deceleration 250 ms 150-23 chord, time 0 PLAX Peak 5 mm ------ FS, chord, 36 % >29 gradient, D Hg PLAX Peak E/A 1.5 ------ LVPW, ED 14.6 mm ------ ratio IVS/LVPW 0.95 <1.3 Tricuspid valve ratio, ED Regurg peak 215 cm/s ------ Ventricular septum vel IVS, ED 13.8 mm ------ Peak RV-RA 18 mm ------ LVOT gradient, S Hg Diam, S 19 mm ------ Pulmonic valve Area 2.84 cm^2 ------ Regurg vel, 119 cm/s ------ Aorta ED Root diam, 24 mm ------ ED Left atrium AP dim 44 mm ------ AP dim 2.43 cm/m^2 <2.2 index Right ventricle RVID ED, 17.9 mm 19-38 PLAX  ------------------------------------------------------------ Prepared and Electronically Authenticated by  Fransico Him, MD 2015-04-24T17:24:[id number]   Assessment / Plan: 1. MV prolapse with mitral insufficiency. I reviewed Echo images. She has predominant posterior prolapse with eccentric jet of MR anteriorly. There is moderate MR. I am concerned that with her symptoms we may be underestimating the degree of MR. Her murmur certainly sounds worse but may be confounded by [facility] with dynamic outflow obstruction. I have recommended a TEE to better evaluate her valve morphology and degree of insufficiency. If her MR is more severe we may need to consider repair  given her symptoms of dyspnea.  2. Dyspnea on exertion. Multiple potential etiologies. I am most concerned about the severity of the MR. Asthma may be playing a role and may be exacerbated by metoprolol. She would like to stop metoprolol at this point to see if her symptoms improve. If so a more selective beta blocker like bisoprolol may be better. Also need to consider the possibility of ischemia with her risk factors. If her TEE shows that the MR is not so severe then would consider a stress Echo to rule out ischemic heart disease and to assess MR and right heart pressures with exertion.  3. Palpitations with PVCs and PACs noted on monitor. Will hold metoprolol for now as noted above. Consider more selective beta blocker. If unable to manage symptoms may need to consider antiarrhythmic drug therapy but I would try and avoid with risk of proarrhythmia.   4. HTN continue Avapro.

## 2013-11-10 NOTE — Interval H&P Note (Signed)
History and Physical Interval Note:  11/10/2013 7:35 AM  Janet [name]  has presented today for surgery, with the diagnosis of MITRAL VALVE PROLAPSE  The various methods of treatment have been discussed with the patient and family. After consideration of risks, benefits and other options for treatment, the patient has consented to  Procedure(s): TRANSESOPHAGEAL ECHOCARDIOGRAM (TEE) (N/A) as a surgical intervention .  The patient's history has been reviewed, patient examined, no change in status, stable for surgery.  I have reviewed the patient's chart and labs.  Questions were answered to the patient's satisfaction.     Dorothy Spark

## 2013-11-10 NOTE — Progress Notes (Signed)
  Echocardiogram Echocardiogram Transesophageal has been performed.  Carney Corners 11/10/2013, 10:03 AM

## 2013-11-10 NOTE — H&P (View-Only) (Signed)
Echo completed

## 2013-11-11 ENCOUNTER — Telehealth: Payer: Self-pay | Admitting: Cardiology

## 2013-11-11 ENCOUNTER — Encounter (HOSPITAL_COMMUNITY): Payer: Self-pay | Admitting: Cardiology

## 2013-11-11 NOTE — Telephone Encounter (Signed)
Returned call to patient she stated she would like to speak to [place]calfe about TEE.Stated she did not get good news.Stated Dr.McLean wanted to make some changes in her medication and she did not want to until she discussed with Dr.Jordan.Dr.Jordan out of office this week.Appointment moved up with Dr.Jordan to 11/19/13 at 12:15 pm.Message sent to [place].

## 2013-11-11 NOTE — Telephone Encounter (Signed)
New Prob    Requesting to nurse speak to nurse regarding TEE results. Please call.

## 2013-11-13 ENCOUNTER — Ambulatory Visit: Payer: BC Managed Care – PPO | Admitting: Cardiology

## 2013-11-17 ENCOUNTER — Ambulatory Visit
Admission: RE | Admit: 2013-11-17 | Discharge: 2013-11-17 | Disposition: A | Discharge status: Home / Self Care | Payer: BC Managed Care – PPO | Source: Ambulatory Visit | Attending: Obstetrics & Gynecology | Admitting: Obstetrics & Gynecology

## 2013-11-17 DIAGNOSIS — R928 Other abnormal and inconclusive findings on diagnostic imaging of breast: Secondary | ICD-10-CM

## 2013-11-19 ENCOUNTER — Encounter: Payer: Self-pay | Admitting: Cardiology

## 2013-11-19 ENCOUNTER — Ambulatory Visit (INDEPENDENT_AMBULATORY_CARE_PROVIDER_SITE_OTHER): Payer: BC Managed Care – PPO | Admitting: Cardiology

## 2013-11-19 VITALS — BP 140/79 | HR 97 | Ht 61.0 in | Wt 179.8 lb

## 2013-11-19 DIAGNOSIS — I493 Ventricular premature depolarization: Secondary | ICD-10-CM

## 2013-11-19 DIAGNOSIS — I5189 Other ill-defined heart diseases: Secondary | ICD-10-CM

## 2013-11-19 DIAGNOSIS — I4949 Other premature depolarization: Secondary | ICD-10-CM

## 2013-11-19 DIAGNOSIS — I059 Rheumatic mitral valve disease, unspecified: Secondary | ICD-10-CM

## 2013-11-19 DIAGNOSIS — I340 Nonrheumatic mitral (valve) insufficiency: Secondary | ICD-10-CM

## 2013-11-19 DIAGNOSIS — I341 Nonrheumatic mitral (valve) prolapse: Secondary | ICD-10-CM

## 2013-11-19 DIAGNOSIS — I10 Essential (primary) hypertension: Secondary | ICD-10-CM

## 2013-11-19 MED ORDER — IRBESARTAN 150 MG PO TABS: 150.0000 mg | ORAL_TABLET | Freq: Every day | ORAL | Status: DC

## 2013-11-19 MED ORDER — VERAPAMIL HCL ER 120 MG PO TBCR: 120.0000 mg | EXTENDED_RELEASE_TABLET | Freq: Every day | ORAL | Status: DC

## 2013-11-19 NOTE — Patient Instructions (Signed)
Start Verapamil SR 120 mg daily. Increase dose every 2 weeks by one tablet as tolerated.  Change Availide to Avapro 150 mg daily.  I will see you in 6 weeks.

## 2013-11-19 NOTE — Progress Notes (Signed)
Janet [name] Date of Birth: 1951-02-24 Medical Record #[id number]  History of Present Illness: Janet [name] is seen for follow up of her mitral valve prolapse. She has undergone a TEE since her initial evaluation. She stopped taking her metoprolol. Within 2 days she reports her dyspnea was significantly improved and she is now able to go up stairs without dyspnea. TEE results noted below.Palpitations are about the same.   Current Outpatient Prescriptions on File Prior to Visit  Medication Sig Dispense Refill  . albuterol (PROVENTIL HFA;VENTOLIN HFA) 108 (90 BASE) MCG/ACT inhaler Inhale 2 puffs into the lungs every 6 (six) hours as needed for wheezing.      Marland Kitchen atorvastatin (LIPITOR) 40 MG tablet Take 40 mg by mouth daily.      . budesonide (PULMICORT) 180 MCG/ACT inhaler Inhale 1 puff into the lungs 2 (two) times daily.      . fish oil-omega-3 fatty acids 1000 MG capsule Take 2 g by mouth daily.       No current facility-administered medications on file prior to visit.    Allergies  Allergen Reactions  . Pneumococcal Vaccines Swelling    Past Medical History  Diagnosis Date  . Hypertension   . Asthma   . MVP (mitral valve prolapse)   . Dysrhythmia     palpitations from MVP  . High cholesterol     takes lipitor  . Mitral insufficiency   . PVC's (premature ventricular contractions)   . Dynamic left ventricular outflow obstruction 11/19/2013    Past Surgical History  Procedure Laterality Date  . Appendectomy    . Nailbed repair Right 10/14/2012    Procedure: BIOPSY NAIL MATRIX RIGHT THUMB;  Surgeon: Wynonia Sours, MD;  Location: [place];  Service: Orthopedics;  Laterality: Right;  . Tee without cardioversion N/A 11/10/2013    Procedure: TRANSESOPHAGEAL ECHOCARDIOGRAM (TEE);  Surgeon: Dorothy Spark, MD;  Location: [facility] ENDOSCOPY;  Service: Cardiovascular;  Laterality: N/A;    History  Smoking status  . Former Smoker  Smokeless tobacco  . Not on file     History  Alcohol Use: Not on file    Family History  Problem Relation Age of Onset  . Arrhythmia Father   . Arrhythmia Brother   . Heart disease Brother     SBE, MV repair    Review of Systems: As noted in HPI.  All other systems were reviewed and are negative.  Physical Exam: BP 140/79  Pulse 97  Ht 5\' 1"  (1.549 m)  Wt 179 lb 12.8 oz (81.557 kg)  BMI 33.99 kg/m2 She is a pleasant overweight WF in NAD.  HEENT: [place]/AT, PERRL, EOMI, sclera are clear. Oropharynx is clear. Neck: No JVD, adenopathy, thyromegaly, or bruits.  Lungs: clear CV: RRR, normal S1-S2. There is a grade 2- 3/6 holosystolic murmur with late peaking noted in the apex and radiating to the left axilla and across the precordium Abd: soft, NT. BS +. No masses or HSM Ext: no cyanosis or edema. Pulses 2+ Skin: warm and dry. No rashes. Neuro: alert and oriented x 3. CN II-XII intact. Nonfocal.  LABORATORY DATA: Ecg: NSR with first degree AV block and PVCs, PACs. Rate 88 bpm. Nonspecific ST abnormality.   Echo: 10/30/13:Transthoracic Echocardiography  Patient: Janet [name] MR #: [id number] Study Date: 10/30/2013 Gender: F Age: 63 Height: 154.9cm Weight: 81.6kg BSA: 1.75m^2 Pt. Status: Room:  ATTENDING Fransico Him, MD Langston Masker, [place], [place] REFERRING [place] SONOGRAPHER Janet [name],  RDCS PERFORMING [facility], Outpatient cc:  ------------------------------------------------------------ LV EF: 60% - 65%  ------------------------------------------------------------ History: PMH: MVP with MR, DOE for the past 3-4 months Dyspnea. Mitral valve disease. Mitral valve prolapse.  ------------------------------------------------------------ Study Conclusions  - Left ventricle: The cavity size was normal. There was moderate focal basal and mild concentric hypertrophy. Systolic function was normal. The estimated ejection fraction was in the range of 60% to 65%.  Wall motion was normal; there were no regional wall motion abnormalities. The outflow tract showed a velocity flow profile with a late systolic peak, typical of dynamic obstruction. - Mitral valve: Moderate, holosystolicprolapse, involving the posterior leaflet. Moderate regurgitation directed eccentrically. - Left atrium: The atrium was mildly dilated. - Atrial septum: There was increased thickness of the septum, consistent with lipomatous hypertrophy. No defect or patent foramen ovale was identified. Transthoracic echocardiography. M-mode, complete 2D, spectral Doppler, and color Doppler. Height: Height: 154.9cm. Height: 61in. Weight: Weight: 81.6kg. Weight: 179.6lb. Body mass index: BMI: 34kg/m^2. Body surface area: BSA: 1.49m^2. Blood pressure: 145/84. Patient status: Outpatient.  ------------------------------------------------------------  ------------------------------------------------------------ Left ventricle: There is turbulence in the LVOT with probable chordal SAM The cavity size was normal. There was moderate focal basal and mild concentric hypertrophy. Systolic function was normal. The estimated ejection fraction was in the range of 60% to 65%. Wall motion was normal; there were no regional wall motion abnormalities. The outflow tract showed a velocity flow profile with a late systolic peak, typical of dynamic obstruction.  ------------------------------------------------------------ Aortic valve: Structurally normal valve. Cusp separation was normal. Doppler: Transvalvular velocity was within the normal range. There was no stenosis. No regurgitation.  ------------------------------------------------------------ Aorta: The aorta was normal, not dilated, and non-diseased.  ------------------------------------------------------------ Mitral valve: Moderate, holosystolicprolapse, involving the posterior leaflet. Doppler: Moderate regurgitation directed  eccentrically. Peak gradient: 81mm Hg (D).  ------------------------------------------------------------ Left atrium: The atrium was mildly dilated.  ------------------------------------------------------------ Atrial septum: There was increased thickness of the septum, consistent with lipomatous hypertrophy. No defect or patent foramen ovale was identified.  ------------------------------------------------------------ Right ventricle: The cavity size was normal. Wall thickness was normal. Systolic function was normal.  ------------------------------------------------------------ Pulmonic valve: Structurally normal valve. Cusp separation was normal. Doppler: Transvalvular velocity was within the normal range. Mild regurgitation.  ------------------------------------------------------------ Tricuspid valve: Structurally normal valve. Leaflet separation was normal. Doppler: Transvalvular velocity was within the normal range. Mild regurgitation.  ------------------------------------------------------------ Right atrium: The atrium was normal in size.  ------------------------------------------------------------ Pericardium: The pericardium was normal in appearance.  ------------------------------------------------------------ Systemic veins: Inferior vena cava: The vessel was normal in size; the respirophasic diameter changes were in the normal range (= 50%); findings are consistent with normal central venous pressure.  ------------------------------------------------------------ Post procedure conclusions Ascending Aorta:  - The aorta was normal, not dilated, and non-diseased.  ------------------------------------------------------------  2D measurements Normal Doppler measurements Normal Left ventricle Mitral valve LVID ED, 45.6 mm 43-52 Peak E vel 107 cm/s ------ chord, Peak A vel 72. cm/s ------ PLAX 1 LVID ES, 29.4 mm 23-38 Deceleration 250 ms 150-23 chord, time  0 PLAX Peak 5 mm ------ FS, chord, 36 % >29 gradient, D Hg PLAX Peak E/A 1.5 ------ LVPW, ED 14.6 mm ------ ratio IVS/LVPW 0.95 <1.3 Tricuspid valve ratio, ED Regurg peak 215 cm/s ------ Ventricular septum vel IVS, ED 13.8 mm ------ Peak RV-RA 18 mm ------ LVOT gradient, S Hg Diam, S 19 mm ------ Pulmonic valve Area 2.84 cm^2 ------ Regurg vel, 119 cm/s ------ Aorta ED Root diam, 24 mm ------ ED Left atrium AP dim 44 mm ------ AP dim  2.43 cm/m^2 <2.2 index Right ventricle RVID ED, 17.9 mm 19-38 PLAX  ------------------------------------------------------------ Prepared and Electronically Authenticated by  Fransico Him, MD 2015-04-24T17:24:[id number]  Procedure: TEE  Indication: Mitral regurgitation  Sedation: Versed 5 mg IV, Fentanyl 75 mcg IV  Findings: Please see echo section for full report. The left ventricle was normal in size with moderate focal basal septal hypertrophy. EF 60-65%. The LV outflow tract was narrow. There was turbulent flow in the LV outflow tract. There was systolic anterior motion of the mitral valve. Peak LVOT gradient was 88 mmHg. There was bileaflet mitral valve prolapse in addition to anterior leaflet SAM. There did appear to be severe mitral regurgitation (though there was not systolic flow reversal in the pulmonary vein doppler pattern). The aortic valve was trileaflet without significant stenosis or regurgitation. The RV was normal in size and systolic function.  Impression: Severe MR in the setting of both mitral valve prolapse and systolic anterior motion of the mitral valve. There was moderate focal basal septal hypertrophy with narrow LVOT and peak LVOT gradient 88 mmHg.  This is likely causing her dyspnea. Would give trial of medical therapy (CCB, BB) to decreased LVOT gradient and SAM initially.  Larey Dresser  11/10/2013  9:49 AM  Assessment / Plan: 1. MV prolapse with mitral insufficiency.TEE demonstrates bileaflet prolapse without ruptured  chord. There is severe MR although no flow reversal in the pulmonary veins. She also has basal septal hypertrophy with SAM and resting dysnamic gradient of 88 mm Hg.   2. LVOT outflow tract obstruction with SAM and basal septal hypertrophy.  3. Palpitations with PVCs and PACs noted on monitor.   4. Asthma  5. HTN   Her symptoms of dyspnea have improved significantly since we stopped her metoprolol suggesting that there is a bronchospastic component to her symptoms. One would expect that a beta blocker would reduce her outflow tract obstruction and improve her symptoms but that does not appear to be the case. We discussed the findings of her TEE with the patient and her husband. We discussed treatment options including medical therapy and surgical options. I think the initial option should be medical and have recommended starting with Verapamil CR 120 mg daily. Will titrate up by 120 mg every 2 weeks until we get to 360 mg daily or she develops side effect. Her husband is a physician and will monitor her BP and HR response. We may also consider a more selective beta blocker such as bisoprolol but given her response to metoprolol we will try a calcium channel blocker first. We will reduce her Avapro to 150 mg daily and stop her HCTZ since this will reduce her preload and may worsen her outflow gradient. I will see her in 6 weeks for follow up.   We also discussed potential surgery if she remains symptomatic or is unable to tolerate medications. Ordinarily, I would recommend fairly early repair of MV prolapse but the presence of LVOT obstruction makes the surgical option more complex. There is the possibility that reducing her outflow gradient with medication may lead to improvement in her MR as well. Surgical therapy may include MV repair or replacement with septal myectomy. Her brother had MV repair at the [facility] clinic and her husband would like to get the opinion of her surgeon there. We will burn a disc  of her Echo studies for him to send there. Unfortunately her transthoracic study was not as revealing as her TEE and we may want to consider repeating  either TTE or TEE once medial therapy is optimized.

## 2013-11-20 ENCOUNTER — Telehealth: Payer: Self-pay | Admitting: Cardiology

## 2013-11-20 NOTE — Telephone Encounter (Signed)
Fax Cover Sheet Sent to Cindy/Echo Dept @ [place] [phone] requesting TEE on 5.5.15 To be put on Cd and Sent interoffice to my attention Once received call pt For Pick up she Will need to Sign ROI  And she is also Requesting last 2 OV Notes along With Echo Solmon Ice is Making is making For Me.

## 2013-11-24 ENCOUNTER — Telehealth: Payer: Self-pay | Admitting: Cardiology

## 2013-11-24 NOTE — Telephone Encounter (Signed)
Left Message With Husband made him aware His Wife's Records are Ready  For pick up  And she Will need to Sign To ROI Upon picking These up 5.19.15/kdm

## 2013-11-26 ENCOUNTER — Ambulatory Visit: Payer: BC Managed Care – PPO | Admitting: Cardiology

## 2013-12-03 ENCOUNTER — Telehealth: Payer: Self-pay | Admitting: *Deleted

## 2013-12-03 ENCOUNTER — Ambulatory Visit: Payer: BC Managed Care – PPO | Admitting: Interventional Cardiology

## 2013-12-03 MED ORDER — VERAPAMIL HCL ER 120 MG PO TBCR: EXTENDED_RELEASE_TABLET | ORAL | Status: DC

## 2013-12-03 NOTE — Telephone Encounter (Signed)
Returned call to patient she stated she has been taking verapamil er 120 mg twice a day and may increase next week to three times a day.Refill sent to pharmacy.

## 2013-12-03 NOTE — Telephone Encounter (Signed)
Patient called stating that she needs another verapamil refill, but the pharmacy wont refill it because it is too soon. It isn't too soon, it is simply that she has been titrating her dose as directed by Dr Martinique. Can you clarify how long she is to be titrating so she can get the correct quantity? Thanks, MI

## 2013-12-21 ENCOUNTER — Telehealth: Payer: Self-pay | Admitting: Cardiology

## 2013-12-21 NOTE — Telephone Encounter (Signed)
New message     Dr Lynford Humphrey to get an update message to Dr Martinique.  Since 6-3 wife has been on verapamil.  They have been in contact with the [facility] clinic and want to talk to Dr Martinique about this.  Please call sometimes this week.

## 2013-12-21 NOTE — Telephone Encounter (Signed)
Discussed with Fara Olden. Tolerating verapamil well. Plan is to have surgery at [facility]. Will reschedule follow up after she has surgery. We can cancel appointment with me on 01/01/14.  Peter Martinique MD, [facility]

## 2014-01-01 ENCOUNTER — Ambulatory Visit: Payer: BC Managed Care – PPO | Admitting: Cardiology

## 2014-01-14 ENCOUNTER — Encounter: Payer: Self-pay | Admitting: Interventional Cardiology

## 2014-02-23 ENCOUNTER — Telehealth: Payer: Self-pay | Admitting: Cardiology

## 2014-02-23 NOTE — Telephone Encounter (Signed)
Returned a call to patient. He states that he would like to have Dr. Martinique to call him regarding his wife's appointment to the [facility] clinid. Call will be routed to Dr. Martinique.

## 2014-02-23 NOTE — Telephone Encounter (Signed)
New message           Pt husband is calling concerning pt visit to [facility] clinic

## 2014-03-09 HISTORY — PX: MITRAL VALVE REPAIR: SHX2039

## 2014-03-09 HISTORY — PX: CARDIAC SURGERY: SHX584

## 2014-03-16 ENCOUNTER — Telehealth: Payer: Self-pay | Admitting: Cardiology

## 2014-03-16 NOTE — Telephone Encounter (Signed)
New message           Pt husband would like for dr Martinique to give him a call / pt husband is asking for a 2 week f/u appt with Martinique but there is no open space / offered pt husband PA or NP and he declined for 9/11

## 2014-03-16 NOTE — Telephone Encounter (Signed)
Returned call to patient's husband he stated wife is being discharged from [facility] today.Stated she recently had heart surgery.Mitral valve repair.Stated she needed to be seen in about 2 weeks.Only wants to see Dr.Jordan.Appointment scheduled with Dr.Jordan 04/06/14 at 12:15 pm.Records to be faxed.Husband will be bringing records too.

## 2014-04-06 ENCOUNTER — Ambulatory Visit (INDEPENDENT_AMBULATORY_CARE_PROVIDER_SITE_OTHER): Payer: BC Managed Care – PPO | Admitting: Cardiology

## 2014-04-06 ENCOUNTER — Ambulatory Visit: Payer: BC Managed Care – PPO | Admitting: Cardiology

## 2014-04-06 ENCOUNTER — Encounter: Payer: Self-pay | Admitting: Cardiology

## 2014-04-06 VITALS — BP 146/80 | HR 79 | Ht 61.0 in | Wt 167.0 lb

## 2014-04-06 DIAGNOSIS — I059 Rheumatic mitral valve disease, unspecified: Secondary | ICD-10-CM

## 2014-04-06 DIAGNOSIS — I341 Nonrheumatic mitral (valve) prolapse: Secondary | ICD-10-CM

## 2014-04-06 DIAGNOSIS — I340 Nonrheumatic mitral (valve) insufficiency: Secondary | ICD-10-CM

## 2014-04-06 DIAGNOSIS — I447 Left bundle-branch block, unspecified: Secondary | ICD-10-CM

## 2014-04-06 DIAGNOSIS — Z9889 Other specified postprocedural states: Secondary | ICD-10-CM

## 2014-04-06 DIAGNOSIS — I421 Obstructive hypertrophic cardiomyopathy: Secondary | ICD-10-CM

## 2014-04-06 NOTE — Patient Instructions (Signed)
Increase Avapro to Avapro HCT 300/12.5 mg one half tablet daily and follow BP  Come off coumadin on October 14.  SBE prophylaxis.  I will see you in 2 months.

## 2014-04-06 NOTE — Progress Notes (Signed)
Janet [name] Date of Birth: 10/04/1950 Medical Record #[id number]  History of Present Illness: Janet [name] is seen for follow up s/p septal myectomy and MV repair at [facility] clinic on March 09, 2014. This was performed for HOCM with severe LV outflow gradient as well as MV prolapse with severe posterior MV prolapse and severe MR. She had extended septal myectomy with triangular resection of the posterior MV leaflet with a C shaped annuloplasty ring. She did very well in the post op period. No Afib. She did develop a post op LBBB. She was anticoagulated with coumadin. Follow up Echo showed no LVOT gradient and only trivial MR. She still has some left parasternal chest pain. She is taking oxycodone once a day. She has rare skipped beats and this is better than pre op. No dyspnea. She is walking 1.5 mi/day taking breaks. She is scheduled to start Cardiac Rehab this week. She was DC on no antihypertensive therapy. Her BP has been increasing and she was started on Avapro now 150 mg.   Current Outpatient Prescriptions on File Prior to Visit  Medication Sig Dispense Refill  . albuterol (PROVENTIL HFA;VENTOLIN HFA) 108 (90 BASE) MCG/ACT inhaler Inhale 2 puffs into the lungs every 6 (six) hours as needed for wheezing.      Marland Kitchen atorvastatin (LIPITOR) 40 MG tablet Take 20 mg by mouth daily.       . budesonide (PULMICORT) 180 MCG/ACT inhaler Inhale 1 puff into the lungs 2 (two) times daily.      . fish oil-omega-3 fatty acids 1000 MG capsule Take 2 g by mouth daily.      . IBUPROFEN PO Take by mouth as directed.      . irbesartan (AVAPRO) 150 MG tablet Take 1 tablet (150 mg total) by mouth daily.  30 tablet  11   No current facility-administered medications on file prior to visit.    Allergies  Allergen Reactions  . Pneumococcal Vaccines Swelling    Past Medical History  Diagnosis Date  . Hypertension   . Asthma   . MVP (mitral valve prolapse)   . Dysrhythmia     palpitations from MVP  . High  cholesterol     takes lipitor  . Mitral insufficiency     severe  . PVC's (premature ventricular contractions)   . HOCM (hypertrophic obstructive cardiomyopathy)   . LBBB (left bundle branch block)   . Obesity   . Melanoma in situ of left upper extremity   . S/P MVR (mitral valve repair) 04/06/2014    Past Surgical History  Procedure Laterality Date  . Appendectomy    . Nailbed repair Right 10/14/2012    Procedure: BIOPSY NAIL MATRIX RIGHT THUMB;  Surgeon: Wynonia Sours, MD;  Location: [place];  Service: Orthopedics;  Laterality: Right;  . Tee without cardioversion N/A 11/10/2013    Procedure: TRANSESOPHAGEAL ECHOCARDIOGRAM (TEE);  Surgeon: Dorothy Spark, MD;  Location: [facility] ENDOSCOPY;  Service: Cardiovascular;  Laterality: N/A;  . Septal myectomy, mv repair  03/09/14    [facility]    History  Smoking status  . Former Smoker  Smokeless tobacco  . Not on file    History  Alcohol Use: Not on file    Family History  Problem Relation Age of Onset  . Arrhythmia Father   . Arrhythmia Brother   . Heart disease Brother     SBE, MV repair    Review of Systems: As noted in HPI.  All other  systems were reviewed and are negative.  Physical Exam: BP 146/80  Pulse 79  Ht 5\' 1"  (1.549 m)  Wt 167 lb (75.751 kg)  BMI 31.57 kg/m2 She is a pleasant overweight WF in NAD.  HEENT: [place]/AT, PERRL, EOMI, sclera are clear. Oropharynx is clear. Neck: No JVD, adenopathy, thyromegaly, or bruits.  Lungs: clear CV: RRR, normal S1-S2. There is no significant murmur. The sternal incision is healing well. Abd: soft, NT. BS +. No masses or HSM Ext: no cyanosis or edema. Pulses 2+ Skin: warm and dry. No rashes. Neuro: alert and oriented x 3. CN II-XII intact. Nonfocal.  LABORATORY DATA: Ecg: NSR with first degree AV block and LBBB   Assessment / Plan: 1. MV prolapse with severe mitral insufficiency.Now s/p posterior MV leaflet repair. Trivial residual insufficiency. Murmur  resolved.   2. HOCM s/p septal myectomy. No residual gradient.  3. PVCs- symptoms improved post op.  4. Asthma  5. HTN - will increase Avapro to Avapro HCT 150/6.25 mg daily. If BP remains high will increase further.   Will proceed with Cardiac Rehab. Follow up in 2 months. Lifelong SBE prophylaxis recommended. Discussed screening of immediate family members for HOCM. Will stop coumadin October 14. Continue ASA 81 mg daily. Records from [facility] clinic to be scanned into record.

## 2014-04-08 ENCOUNTER — Encounter (HOSPITAL_COMMUNITY)
Admission: RE | Admit: 2014-04-08 | Discharge: 2014-04-08 | Disposition: A | Discharge status: Home / Self Care | Payer: BC Managed Care – PPO | Source: Ambulatory Visit | Attending: Cardiology | Admitting: Cardiology

## 2014-04-08 DIAGNOSIS — Z79899 Other long term (current) drug therapy: Secondary | ICD-10-CM | POA: Insufficient documentation

## 2014-04-08 DIAGNOSIS — I341 Nonrheumatic mitral (valve) prolapse: Secondary | ICD-10-CM | POA: Insufficient documentation

## 2014-04-08 DIAGNOSIS — Z887 Allergy status to serum and vaccine status: Secondary | ICD-10-CM | POA: Insufficient documentation

## 2014-04-08 DIAGNOSIS — Z87891 Personal history of nicotine dependence: Secondary | ICD-10-CM | POA: Insufficient documentation

## 2014-04-08 DIAGNOSIS — Z8249 Family history of ischemic heart disease and other diseases of the circulatory system: Secondary | ICD-10-CM | POA: Insufficient documentation

## 2014-04-08 DIAGNOSIS — I493 Ventricular premature depolarization: Secondary | ICD-10-CM | POA: Insufficient documentation

## 2014-04-08 DIAGNOSIS — I447 Left bundle-branch block, unspecified: Secondary | ICD-10-CM | POA: Insufficient documentation

## 2014-04-08 DIAGNOSIS — Z9889 Other specified postprocedural states: Secondary | ICD-10-CM | POA: Insufficient documentation

## 2014-04-08 DIAGNOSIS — J45909 Unspecified asthma, uncomplicated: Secondary | ICD-10-CM | POA: Insufficient documentation

## 2014-04-08 DIAGNOSIS — I340 Nonrheumatic mitral (valve) insufficiency: Secondary | ICD-10-CM | POA: Insufficient documentation

## 2014-04-08 DIAGNOSIS — Z791 Long term (current) use of non-steroidal anti-inflammatories (NSAID): Secondary | ICD-10-CM | POA: Insufficient documentation

## 2014-04-08 DIAGNOSIS — Z5189 Encounter for other specified aftercare: Secondary | ICD-10-CM | POA: Insufficient documentation

## 2014-04-08 DIAGNOSIS — E669 Obesity, unspecified: Secondary | ICD-10-CM | POA: Insufficient documentation

## 2014-04-08 DIAGNOSIS — I10 Essential (primary) hypertension: Secondary | ICD-10-CM | POA: Insufficient documentation

## 2014-04-08 DIAGNOSIS — I421 Obstructive hypertrophic cardiomyopathy: Secondary | ICD-10-CM | POA: Insufficient documentation

## 2014-04-08 NOTE — Progress Notes (Signed)
Cardiac Rehab Medication Review by a Pharmacist  Does the patient  feel that his/her medications are working for him/her?  yes  Has the patient been experiencing any side effects to the medications prescribed?  no  Does the patient measure his/her own blood pressure?  yes  Usually runs in 130s/70s  Does the patient have any problems obtaining medications due to transportation or finances?   No  Understanding of regimen: good Understanding of indications: good Potential of compliance: good   Pharmacist comments: Pt reports being compliant with all of his medications and taking them as prescribed. Pt has a good understanding of his medications. No questions or complaints from the pt.   Janet [name] 04/08/2014 8:24 AM

## 2014-04-09 ENCOUNTER — Ambulatory Visit (HOSPITAL_COMMUNITY): Payer: BC Managed Care – PPO

## 2014-04-12 ENCOUNTER — Encounter (HOSPITAL_COMMUNITY)
Admission: RE | Admit: 2014-04-12 | Discharge: 2014-04-12 | Disposition: A | Discharge status: Home / Self Care | Payer: BC Managed Care – PPO | Source: Ambulatory Visit | Attending: Cardiology | Admitting: Cardiology

## 2014-04-12 ENCOUNTER — Encounter (HOSPITAL_COMMUNITY): Payer: BC Managed Care – PPO

## 2014-04-12 DIAGNOSIS — Z5189 Encounter for other specified aftercare: Secondary | ICD-10-CM | POA: Diagnosis present

## 2014-04-12 DIAGNOSIS — Z9889 Other specified postprocedural states: Secondary | ICD-10-CM | POA: Diagnosis not present

## 2014-04-12 DIAGNOSIS — Z887 Allergy status to serum and vaccine status: Secondary | ICD-10-CM | POA: Diagnosis not present

## 2014-04-12 DIAGNOSIS — Z87891 Personal history of nicotine dependence: Secondary | ICD-10-CM | POA: Diagnosis not present

## 2014-04-12 DIAGNOSIS — I421 Obstructive hypertrophic cardiomyopathy: Secondary | ICD-10-CM | POA: Diagnosis not present

## 2014-04-12 DIAGNOSIS — Z79899 Other long term (current) drug therapy: Secondary | ICD-10-CM | POA: Diagnosis not present

## 2014-04-12 DIAGNOSIS — J45909 Unspecified asthma, uncomplicated: Secondary | ICD-10-CM | POA: Diagnosis not present

## 2014-04-12 DIAGNOSIS — E669 Obesity, unspecified: Secondary | ICD-10-CM | POA: Diagnosis not present

## 2014-04-12 DIAGNOSIS — Z791 Long term (current) use of non-steroidal anti-inflammatories (NSAID): Secondary | ICD-10-CM | POA: Diagnosis not present

## 2014-04-12 DIAGNOSIS — I493 Ventricular premature depolarization: Secondary | ICD-10-CM | POA: Diagnosis not present

## 2014-04-12 DIAGNOSIS — I341 Nonrheumatic mitral (valve) prolapse: Secondary | ICD-10-CM | POA: Diagnosis not present

## 2014-04-12 DIAGNOSIS — I10 Essential (primary) hypertension: Secondary | ICD-10-CM | POA: Diagnosis not present

## 2014-04-12 DIAGNOSIS — I447 Left bundle-branch block, unspecified: Secondary | ICD-10-CM | POA: Diagnosis not present

## 2014-04-12 DIAGNOSIS — I340 Nonrheumatic mitral (valve) insufficiency: Secondary | ICD-10-CM | POA: Diagnosis not present

## 2014-04-12 DIAGNOSIS — Z8249 Family history of ischemic heart disease and other diseases of the circulatory system: Secondary | ICD-10-CM | POA: Diagnosis not present

## 2014-04-12 NOTE — Progress Notes (Signed)
Pt started cardiac rehab today.  Pt tolerated light exercise without difficulty. Telemetry rhythm Sinus with a first degree heart block and a bundle branch block this has been previously documented. Resting systolic blood pressure in the 120's. Max blood exercise blood pressure noted at 160/70 on the airdyne.  Subsequent blood pressures improved. Janet [name]'s PHQ score was a zero. Janet [name]'s short and long term goals are to increase her strength and loose weight. Will continue to monitor the patient throughout  the program and monitor blood pressure.

## 2014-04-14 ENCOUNTER — Encounter (HOSPITAL_COMMUNITY)
Admission: RE | Admit: 2014-04-14 | Discharge: 2014-04-14 | Disposition: A | Discharge status: Home / Self Care | Payer: BC Managed Care – PPO | Source: Ambulatory Visit | Attending: Cardiology | Admitting: Cardiology

## 2014-04-14 ENCOUNTER — Encounter (HOSPITAL_COMMUNITY): Payer: BC Managed Care – PPO

## 2014-04-14 DIAGNOSIS — Z5189 Encounter for other specified aftercare: Secondary | ICD-10-CM | POA: Diagnosis not present

## 2014-04-16 ENCOUNTER — Encounter (HOSPITAL_COMMUNITY): Payer: BC Managed Care – PPO

## 2014-04-16 ENCOUNTER — Encounter (HOSPITAL_COMMUNITY)
Admission: RE | Admit: 2014-04-16 | Discharge: 2014-04-16 | Disposition: A | Discharge status: Home / Self Care | Payer: BC Managed Care – PPO | Source: Ambulatory Visit | Attending: Cardiology | Admitting: Cardiology

## 2014-04-16 DIAGNOSIS — Z5189 Encounter for other specified aftercare: Secondary | ICD-10-CM | POA: Diagnosis not present

## 2014-04-19 ENCOUNTER — Encounter (HOSPITAL_COMMUNITY)
Admission: RE | Admit: 2014-04-19 | Discharge: 2014-04-19 | Disposition: A | Discharge status: Home / Self Care | Payer: BC Managed Care – PPO | Source: Ambulatory Visit | Attending: Cardiology | Admitting: Cardiology

## 2014-04-19 ENCOUNTER — Encounter (HOSPITAL_COMMUNITY): Payer: BC Managed Care – PPO

## 2014-04-19 DIAGNOSIS — Z5189 Encounter for other specified aftercare: Secondary | ICD-10-CM | POA: Diagnosis not present

## 2014-04-21 ENCOUNTER — Encounter (HOSPITAL_COMMUNITY)
Admission: RE | Admit: 2014-04-21 | Discharge: 2014-04-21 | Disposition: A | Discharge status: Home / Self Care | Payer: BC Managed Care – PPO | Source: Ambulatory Visit | Attending: Cardiology | Admitting: Cardiology

## 2014-04-21 ENCOUNTER — Other Ambulatory Visit: Payer: Self-pay | Admitting: Obstetrics & Gynecology

## 2014-04-21 ENCOUNTER — Encounter (HOSPITAL_COMMUNITY): Payer: BC Managed Care – PPO

## 2014-04-21 DIAGNOSIS — R921 Mammographic calcification found on diagnostic imaging of breast: Secondary | ICD-10-CM

## 2014-04-21 DIAGNOSIS — Z5189 Encounter for other specified aftercare: Secondary | ICD-10-CM | POA: Diagnosis not present

## 2014-04-23 ENCOUNTER — Encounter (HOSPITAL_COMMUNITY)
Admission: RE | Admit: 2014-04-23 | Discharge: 2014-04-23 | Disposition: A | Discharge status: Home / Self Care | Payer: BC Managed Care – PPO | Source: Ambulatory Visit | Attending: Cardiology | Admitting: Cardiology

## 2014-04-23 ENCOUNTER — Encounter (HOSPITAL_COMMUNITY): Payer: BC Managed Care – PPO

## 2014-04-23 DIAGNOSIS — Z5189 Encounter for other specified aftercare: Secondary | ICD-10-CM | POA: Diagnosis not present

## 2014-04-26 ENCOUNTER — Encounter (HOSPITAL_COMMUNITY): Payer: BC Managed Care – PPO

## 2014-04-26 ENCOUNTER — Encounter (HOSPITAL_COMMUNITY)
Admission: RE | Admit: 2014-04-26 | Discharge: 2014-04-26 | Disposition: A | Discharge status: Home / Self Care | Payer: BC Managed Care – PPO | Source: Ambulatory Visit | Attending: Cardiology | Admitting: Cardiology

## 2014-04-26 DIAGNOSIS — Z5189 Encounter for other specified aftercare: Secondary | ICD-10-CM | POA: Diagnosis not present

## 2014-04-28 ENCOUNTER — Encounter (HOSPITAL_COMMUNITY)
Admission: RE | Admit: 2014-04-28 | Discharge: 2014-04-28 | Disposition: A | Discharge status: Home / Self Care | Payer: BC Managed Care – PPO | Source: Ambulatory Visit | Attending: Cardiology | Admitting: Cardiology

## 2014-04-28 ENCOUNTER — Encounter (HOSPITAL_COMMUNITY): Payer: BC Managed Care – PPO

## 2014-04-28 DIAGNOSIS — Z5189 Encounter for other specified aftercare: Secondary | ICD-10-CM | POA: Diagnosis not present

## 2014-04-30 ENCOUNTER — Encounter (HOSPITAL_COMMUNITY): Admission: RE | Admit: 2014-04-30 | Payer: BC Managed Care – PPO | Source: Ambulatory Visit

## 2014-04-30 ENCOUNTER — Encounter (HOSPITAL_COMMUNITY)
Admission: RE | Admit: 2014-04-30 | Discharge: 2014-04-30 | Disposition: A | Discharge status: Home / Self Care | Payer: BC Managed Care – PPO | Source: Ambulatory Visit | Attending: Cardiology | Admitting: Cardiology

## 2014-04-30 DIAGNOSIS — Z5189 Encounter for other specified aftercare: Secondary | ICD-10-CM | POA: Diagnosis not present

## 2014-05-03 ENCOUNTER — Encounter (HOSPITAL_COMMUNITY): Payer: BC Managed Care – PPO

## 2014-05-03 ENCOUNTER — Encounter (HOSPITAL_COMMUNITY)
Admission: RE | Admit: 2014-05-03 | Discharge: 2014-05-03 | Disposition: A | Discharge status: Home / Self Care | Payer: BC Managed Care – PPO | Source: Ambulatory Visit | Attending: Cardiology | Admitting: Cardiology

## 2014-05-03 DIAGNOSIS — Z5189 Encounter for other specified aftercare: Secondary | ICD-10-CM | POA: Diagnosis not present

## 2014-05-05 ENCOUNTER — Encounter (HOSPITAL_COMMUNITY)
Admission: RE | Admit: 2014-05-05 | Discharge: 2014-05-05 | Disposition: A | Discharge status: Home / Self Care | Payer: BC Managed Care – PPO | Source: Ambulatory Visit | Attending: Cardiology | Admitting: Cardiology

## 2014-05-05 ENCOUNTER — Encounter (HOSPITAL_COMMUNITY): Payer: BC Managed Care – PPO

## 2014-05-05 DIAGNOSIS — Z5189 Encounter for other specified aftercare: Secondary | ICD-10-CM | POA: Diagnosis not present

## 2014-05-05 NOTE — Progress Notes (Signed)
Janet [name] 63 y.o. female Nutrition Note Spoke with pt.  Nutrition Survey reviewed with pt. Pt is following Step 1 of the Therapeutic Lifestyle Changes diet. Pt wants to lose wt. Pt has been trying to lose wt by exercising more and eating healthier. Pt states she lost 10 lbs before her surgery and has lost 2 lb since her surgery 8 weeks ago. Wt loss tips reviewed. Pt expressed understanding of the information reviewed. Pt aware of nutrition education classes offered.  Nutrition Diagnosis   Food-and nutrition-related knowledge deficit related to lack of exposure to information as related to diagnosis of: ? CVD    Obesity related to excessive energy intake as evidenced by a BMI of 31.4  Nutrition Intervention   Benefits of adopting Therapeutic Lifestyle Changes discussed when Medficts reviewed.   Pt to attend the Portion Distortion class   Pt to attend the  ? Nutrition I class - met; 04/13/14                    ? Nutrition II class   Pt given handouts for: ? Nutrition II class   Continue client-centered nutrition education by RD, as part of interdisciplinary care.  Goal(s)   Pt to identify and limit food sources of saturated fat, trans fat, and cholesterol   Pt to identify food quantities necessary to achieve: ? wt loss to a goal wt of 146-164 lb (66.5-74.7 kg) at graduation from cardiac rehab.   Monitor and Evaluate progress toward nutrition goal with team.   Derek Mound, M.Ed, RD, LDN, CDE 05/05/2014 2:20 PM

## 2014-05-07 ENCOUNTER — Encounter (HOSPITAL_COMMUNITY)
Admission: RE | Admit: 2014-05-07 | Discharge: 2014-05-07 | Disposition: A | Discharge status: Home / Self Care | Payer: BC Managed Care – PPO | Source: Ambulatory Visit | Attending: Cardiology | Admitting: Cardiology

## 2014-05-07 ENCOUNTER — Encounter (HOSPITAL_COMMUNITY): Payer: BC Managed Care – PPO

## 2014-05-07 DIAGNOSIS — Z5189 Encounter for other specified aftercare: Secondary | ICD-10-CM | POA: Diagnosis not present

## 2014-05-10 ENCOUNTER — Encounter (HOSPITAL_COMMUNITY): Payer: BC Managed Care – PPO

## 2014-05-12 ENCOUNTER — Encounter (HOSPITAL_COMMUNITY): Payer: BC Managed Care – PPO

## 2014-05-14 ENCOUNTER — Encounter (HOSPITAL_COMMUNITY): Payer: BC Managed Care – PPO

## 2014-05-17 ENCOUNTER — Encounter (HOSPITAL_COMMUNITY): Payer: BC Managed Care – PPO

## 2014-05-19 ENCOUNTER — Encounter (HOSPITAL_COMMUNITY): Payer: BC Managed Care – PPO

## 2014-05-21 ENCOUNTER — Encounter (HOSPITAL_COMMUNITY): Payer: BC Managed Care – PPO

## 2014-05-24 ENCOUNTER — Encounter (HOSPITAL_COMMUNITY): Payer: BC Managed Care – PPO

## 2014-05-26 ENCOUNTER — Encounter (HOSPITAL_COMMUNITY): Payer: BC Managed Care – PPO

## 2014-05-28 ENCOUNTER — Encounter (HOSPITAL_COMMUNITY): Payer: BC Managed Care – PPO

## 2014-05-31 ENCOUNTER — Encounter (HOSPITAL_COMMUNITY)
Admission: RE | Admit: 2014-05-31 | Discharge: 2014-05-31 | Disposition: A | Discharge status: Home / Self Care | Payer: BC Managed Care – PPO | Source: Ambulatory Visit | Attending: Cardiology | Admitting: Cardiology

## 2014-05-31 DIAGNOSIS — Z9889 Other specified postprocedural states: Secondary | ICD-10-CM | POA: Insufficient documentation

## 2014-05-31 DIAGNOSIS — I493 Ventricular premature depolarization: Secondary | ICD-10-CM | POA: Diagnosis not present

## 2014-05-31 DIAGNOSIS — Z8249 Family history of ischemic heart disease and other diseases of the circulatory system: Secondary | ICD-10-CM | POA: Diagnosis not present

## 2014-05-31 DIAGNOSIS — Z79899 Other long term (current) drug therapy: Secondary | ICD-10-CM | POA: Diagnosis not present

## 2014-05-31 DIAGNOSIS — I447 Left bundle-branch block, unspecified: Secondary | ICD-10-CM | POA: Insufficient documentation

## 2014-05-31 DIAGNOSIS — Z87891 Personal history of nicotine dependence: Secondary | ICD-10-CM | POA: Diagnosis not present

## 2014-05-31 DIAGNOSIS — I341 Nonrheumatic mitral (valve) prolapse: Secondary | ICD-10-CM | POA: Diagnosis not present

## 2014-05-31 DIAGNOSIS — Z887 Allergy status to serum and vaccine status: Secondary | ICD-10-CM | POA: Insufficient documentation

## 2014-05-31 DIAGNOSIS — J45909 Unspecified asthma, uncomplicated: Secondary | ICD-10-CM | POA: Insufficient documentation

## 2014-05-31 DIAGNOSIS — I10 Essential (primary) hypertension: Secondary | ICD-10-CM | POA: Diagnosis not present

## 2014-05-31 DIAGNOSIS — E669 Obesity, unspecified: Secondary | ICD-10-CM | POA: Diagnosis not present

## 2014-05-31 DIAGNOSIS — Z791 Long term (current) use of non-steroidal anti-inflammatories (NSAID): Secondary | ICD-10-CM | POA: Insufficient documentation

## 2014-05-31 DIAGNOSIS — I421 Obstructive hypertrophic cardiomyopathy: Secondary | ICD-10-CM | POA: Diagnosis not present

## 2014-05-31 DIAGNOSIS — Z5189 Encounter for other specified aftercare: Secondary | ICD-10-CM | POA: Insufficient documentation

## 2014-05-31 DIAGNOSIS — I340 Nonrheumatic mitral (valve) insufficiency: Secondary | ICD-10-CM | POA: Insufficient documentation

## 2014-06-01 ENCOUNTER — Ambulatory Visit (INDEPENDENT_AMBULATORY_CARE_PROVIDER_SITE_OTHER): Payer: BC Managed Care – PPO | Admitting: Cardiology

## 2014-06-01 ENCOUNTER — Ambulatory Visit: Payer: BC Managed Care – PPO | Admitting: Cardiology

## 2014-06-01 ENCOUNTER — Encounter: Payer: Self-pay | Admitting: Cardiology

## 2014-06-01 VITALS — BP 130/74 | HR 92 | Ht 61.0 in | Wt 170.1 lb

## 2014-06-01 DIAGNOSIS — Z9889 Other specified postprocedural states: Secondary | ICD-10-CM

## 2014-06-01 DIAGNOSIS — I341 Nonrheumatic mitral (valve) prolapse: Secondary | ICD-10-CM

## 2014-06-01 DIAGNOSIS — I10 Essential (primary) hypertension: Secondary | ICD-10-CM

## 2014-06-01 DIAGNOSIS — I447 Left bundle-branch block, unspecified: Secondary | ICD-10-CM

## 2014-06-01 DIAGNOSIS — I421 Obstructive hypertrophic cardiomyopathy: Secondary | ICD-10-CM

## 2014-06-01 NOTE — Patient Instructions (Signed)
We will schedule you for an Echocardiogram  We will increase your heart rate limit. Your may do unrestricted activity.  I will see you in 6 months.

## 2014-06-01 NOTE — Progress Notes (Signed)
Janet [name] Date of Birth: 1951-03-03 Medical Record #[id number]  History of Present Illness: Janet [name] is seen for follow up s/p septal myectomy and MV repair at [facility] clinic on March 09, 2014. This was performed for HOCM with severe LV outflow gradient as well as MV prolapse with severe posterior MV prolapse and severe MR. She had extended septal myectomy with triangular resection of the posterior MV leaflet with a C shaped annuloplasty ring. She did very well in the post op period. No Afib. She did develop a post op LBBB. She was anticoagulated with coumadin. Follow up Echo post op in hospital showed no LVOT gradient and only trivial MR. She is doing very well and participating in Cardiac Rehab. She wants to do more. No palpitations. No dyspnea or chest pain. She is back on Avalide for BP control. BP at cardiac Rehab has been normal.   Current Outpatient Prescriptions on File Prior to Visit  Medication Sig Dispense Refill  . albuterol (PROVENTIL HFA;VENTOLIN HFA) 108 (90 BASE) MCG/ACT inhaler Inhale 2 puffs into the lungs every 6 (six) hours as needed for wheezing.    Marland Kitchen aspirin 81 MG tablet Take 81 mg by mouth daily.    Marland Kitchen atorvastatin (LIPITOR) 40 MG tablet Take 20 mg by mouth daily.     . budesonide (PULMICORT) 180 MCG/ACT inhaler Inhale 2 puffs into the lungs 2 (two) times daily.     . Cholecalciferol (VITAMIN D3) 2000 UNITS TABS Take 1 tablet by mouth daily.    . citalopram (CELEXA) 40 MG tablet Take 40 mg by mouth daily.    Marland Kitchen docusate sodium (COLACE) 100 MG capsule Take 200 mg by mouth 2 (two) times daily.    . fish oil-omega-3 fatty acids 1000 MG capsule Take 2 g by mouth daily.    . irbesartan-hydrochlorothiazide (AVALIDE) 300-12.5 MG per tablet Take 0.5 tablets by mouth daily.    Marland Kitchen JANTOVEN 2.5 MG tablet 5 mg MWF and 2.5 other days... WARFARIN ends OCT 14th     No current facility-administered medications on file prior to visit.    Allergies  Allergen Reactions  . Oxycodone  Hives  . Pneumococcal Vaccines Swelling    Past Medical History  Diagnosis Date  . Hypertension   . Asthma   . MVP (mitral valve prolapse)   . Dysrhythmia     palpitations from MVP  . High cholesterol     takes lipitor  . Mitral insufficiency     severe  . PVC's (premature ventricular contractions)   . HOCM (hypertrophic obstructive cardiomyopathy)   . LBBB (left bundle branch block)   . Obesity   . Melanoma in situ of left upper extremity   . S/P MVR (mitral valve repair) 04/06/2014    Past Surgical History  Procedure Laterality Date  . Appendectomy    . Nailbed repair Right 10/14/2012    Procedure: BIOPSY NAIL MATRIX RIGHT THUMB;  Surgeon: Wynonia Sours, MD;  Location: [place];  Service: Orthopedics;  Laterality: Right;  . Tee without cardioversion N/A 11/10/2013    Procedure: TRANSESOPHAGEAL ECHOCARDIOGRAM (TEE);  Surgeon: Dorothy Spark, MD;  Location[facility]COPY;  Service: Cardiovascular;  Laterality: N/A;  . Septal myectomy, mv repair  [facility]/09/14    [facility]    History  Smoking status  . Former Smoker  Smokeless tobacco  . Not on file    History  Alcohol Use: Not on file    Family History  Problem Relation Age  of Onset  . Arrhythmia Father   . Arrhythmia Brother   . Heart disease Brother     SBE, MV repair    Review of Systems: As noted in HPI.  All other systems were reviewed and are negative.  Physical Exam: BP 130/74 mmHg  Pulse 92  Ht 5\' 1"  (1.549 m)  Wt 170 lb 1.6 oz (77.157 kg)  BMI 32.16 kg/m2 She is a pleasant overweight WF in NAD.  HEENT: [place]/AT, PERRL, EOMI, sclera are clear. Oropharynx is clear. Neck: No JVD, adenopathy, thyromegaly, or bruits.  Lungs: clear CV: RRR, normal S1-S2. There is no significant murmur. The sternal incision is healing well. Abd: soft, NT. BS +. No masses or HSM Ext: no cyanosis or edema. Pulses 2+ Skin: warm and dry. No rashes. Neuro: alert and oriented x 3. CN II-XII intact.  Nonfocal.  LABORATORY DATA:    Assessment / Plan: 1. MV prolapse with severe mitral insufficiency.Now s/p posterior MV leaflet repair. Trivial residual insufficiency. Murmur resolved. Exam is good.  2. HOCM s/p septal myectomy. No residual gradient.  3. PVCs- symptoms improved post op.  4. Asthma  5. HTN - controlled on Avalide.  She is doing very well. Recommend Echo to establish a new baseline. Follow up in 6 months. She can do more with exercise now but should progress gradually.

## 2014-06-02 ENCOUNTER — Encounter (HOSPITAL_COMMUNITY)
Admission: RE | Admit: 2014-06-02 | Discharge: 2014-06-02 | Disposition: A | Discharge status: Home / Self Care | Payer: BC Managed Care – PPO | Source: Ambulatory Visit | Attending: Cardiology | Admitting: Cardiology

## 2014-06-02 DIAGNOSIS — Z5189 Encounter for other specified aftercare: Secondary | ICD-10-CM | POA: Diagnosis not present

## 2014-06-07 ENCOUNTER — Encounter (HOSPITAL_COMMUNITY)
Admission: RE | Admit: 2014-06-07 | Discharge: 2014-06-07 | Disposition: A | Discharge status: Home / Self Care | Payer: BC Managed Care – PPO | Source: Ambulatory Visit | Attending: Cardiology | Admitting: Cardiology

## 2014-06-07 DIAGNOSIS — Z5189 Encounter for other specified aftercare: Secondary | ICD-10-CM | POA: Diagnosis not present

## 2014-06-08 ENCOUNTER — Telehealth (HOSPITAL_COMMUNITY): Payer: Self-pay | Admitting: *Deleted

## 2014-06-09 ENCOUNTER — Encounter (HOSPITAL_COMMUNITY)
Admission: RE | Admit: 2014-06-09 | Discharge: 2014-06-09 | Disposition: A | Discharge status: Home / Self Care | Payer: BC Managed Care – PPO | Source: Ambulatory Visit | Attending: Cardiology | Admitting: Cardiology

## 2014-06-09 DIAGNOSIS — E669 Obesity, unspecified: Secondary | ICD-10-CM | POA: Insufficient documentation

## 2014-06-09 DIAGNOSIS — J45909 Unspecified asthma, uncomplicated: Secondary | ICD-10-CM | POA: Diagnosis not present

## 2014-06-09 DIAGNOSIS — Z8249 Family history of ischemic heart disease and other diseases of the circulatory system: Secondary | ICD-10-CM | POA: Insufficient documentation

## 2014-06-09 DIAGNOSIS — Z887 Allergy status to serum and vaccine status: Secondary | ICD-10-CM | POA: Insufficient documentation

## 2014-06-09 DIAGNOSIS — Z87891 Personal history of nicotine dependence: Secondary | ICD-10-CM | POA: Diagnosis not present

## 2014-06-09 DIAGNOSIS — I447 Left bundle-branch block, unspecified: Secondary | ICD-10-CM | POA: Diagnosis not present

## 2014-06-09 DIAGNOSIS — I341 Nonrheumatic mitral (valve) prolapse: Secondary | ICD-10-CM | POA: Insufficient documentation

## 2014-06-09 DIAGNOSIS — I421 Obstructive hypertrophic cardiomyopathy: Secondary | ICD-10-CM | POA: Diagnosis not present

## 2014-06-09 DIAGNOSIS — I340 Nonrheumatic mitral (valve) insufficiency: Secondary | ICD-10-CM | POA: Insufficient documentation

## 2014-06-09 DIAGNOSIS — I10 Essential (primary) hypertension: Secondary | ICD-10-CM | POA: Diagnosis not present

## 2014-06-09 DIAGNOSIS — I493 Ventricular premature depolarization: Secondary | ICD-10-CM | POA: Insufficient documentation

## 2014-06-09 DIAGNOSIS — Z79899 Other long term (current) drug therapy: Secondary | ICD-10-CM | POA: Diagnosis not present

## 2014-06-09 DIAGNOSIS — Z5189 Encounter for other specified aftercare: Secondary | ICD-10-CM | POA: Diagnosis present

## 2014-06-09 DIAGNOSIS — Z791 Long term (current) use of non-steroidal anti-inflammatories (NSAID): Secondary | ICD-10-CM | POA: Insufficient documentation

## 2014-06-09 DIAGNOSIS — Z9889 Other specified postprocedural states: Secondary | ICD-10-CM | POA: Insufficient documentation

## 2014-06-10 ENCOUNTER — Ambulatory Visit (HOSPITAL_COMMUNITY)
Admission: RE | Admit: 2014-06-10 | Discharge: 2014-06-10 | Disposition: A | Discharge status: Home / Self Care | Payer: BC Managed Care – PPO | Source: Ambulatory Visit | Attending: Cardiovascular Disease | Admitting: Cardiovascular Disease

## 2014-06-10 DIAGNOSIS — Z87891 Personal history of nicotine dependence: Secondary | ICD-10-CM | POA: Insufficient documentation

## 2014-06-10 DIAGNOSIS — I428 Other cardiomyopathies: Secondary | ICD-10-CM | POA: Diagnosis present

## 2014-06-10 DIAGNOSIS — I10 Essential (primary) hypertension: Secondary | ICD-10-CM | POA: Diagnosis not present

## 2014-06-10 DIAGNOSIS — I341 Nonrheumatic mitral (valve) prolapse: Secondary | ICD-10-CM

## 2014-06-10 DIAGNOSIS — E785 Hyperlipidemia, unspecified: Secondary | ICD-10-CM | POA: Insufficient documentation

## 2014-06-10 DIAGNOSIS — Z9889 Other specified postprocedural states: Secondary | ICD-10-CM | POA: Insufficient documentation

## 2014-06-10 DIAGNOSIS — I447 Left bundle-branch block, unspecified: Secondary | ICD-10-CM

## 2014-06-10 DIAGNOSIS — I517 Cardiomegaly: Secondary | ICD-10-CM

## 2014-06-10 DIAGNOSIS — I421 Obstructive hypertrophic cardiomyopathy: Secondary | ICD-10-CM

## 2014-06-10 NOTE — Progress Notes (Signed)
2D Echocardiogram Complete.  06/10/2014   Desirey Keahey, RDCS  

## 2014-06-11 ENCOUNTER — Encounter (HOSPITAL_COMMUNITY)
Admission: RE | Admit: 2014-06-11 | Discharge: 2014-06-11 | Disposition: A | Discharge status: Home / Self Care | Payer: BC Managed Care – PPO | Source: Ambulatory Visit | Attending: Cardiology | Admitting: Cardiology

## 2014-06-11 DIAGNOSIS — Z5189 Encounter for other specified aftercare: Secondary | ICD-10-CM | POA: Diagnosis not present

## 2014-06-14 ENCOUNTER — Encounter (HOSPITAL_COMMUNITY)
Admission: RE | Admit: 2014-06-14 | Discharge: 2014-06-14 | Disposition: A | Discharge status: Home / Self Care | Payer: BC Managed Care – PPO | Source: Ambulatory Visit | Attending: Cardiology | Admitting: Cardiology

## 2014-06-14 DIAGNOSIS — Z5189 Encounter for other specified aftercare: Secondary | ICD-10-CM | POA: Diagnosis not present

## 2014-06-15 ENCOUNTER — Ambulatory Visit
Admission: RE | Admit: 2014-06-15 | Discharge: 2014-06-15 | Disposition: A | Discharge status: Home / Self Care | Payer: BC Managed Care – PPO | Source: Ambulatory Visit | Attending: Obstetrics & Gynecology | Admitting: Obstetrics & Gynecology

## 2014-06-15 DIAGNOSIS — R921 Mammographic calcification found on diagnostic imaging of breast: Secondary | ICD-10-CM

## 2014-06-16 ENCOUNTER — Encounter (HOSPITAL_COMMUNITY)
Admission: RE | Admit: 2014-06-16 | Discharge: 2014-06-16 | Disposition: A | Discharge status: Home / Self Care | Payer: BC Managed Care – PPO | Source: Ambulatory Visit | Attending: Cardiology | Admitting: Cardiology

## 2014-06-16 DIAGNOSIS — Z5189 Encounter for other specified aftercare: Secondary | ICD-10-CM | POA: Diagnosis not present

## 2014-06-18 ENCOUNTER — Encounter (HOSPITAL_COMMUNITY)
Admission: RE | Admit: 2014-06-18 | Discharge: 2014-06-18 | Disposition: A | Discharge status: Home / Self Care | Payer: BC Managed Care – PPO | Source: Ambulatory Visit | Attending: Cardiology | Admitting: Cardiology

## 2014-06-18 DIAGNOSIS — Z5189 Encounter for other specified aftercare: Secondary | ICD-10-CM | POA: Diagnosis not present

## 2014-06-21 ENCOUNTER — Encounter (HOSPITAL_COMMUNITY)
Admission: RE | Admit: 2014-06-21 | Discharge: 2014-06-21 | Disposition: A | Discharge status: Home / Self Care | Payer: BC Managed Care – PPO | Source: Ambulatory Visit | Attending: Cardiology | Admitting: Cardiology

## 2014-06-21 DIAGNOSIS — Z5189 Encounter for other specified aftercare: Secondary | ICD-10-CM | POA: Diagnosis not present

## 2014-06-23 ENCOUNTER — Encounter (HOSPITAL_COMMUNITY)
Admission: RE | Admit: 2014-06-23 | Discharge: 2014-06-23 | Disposition: A | Discharge status: Home / Self Care | Payer: BC Managed Care – PPO | Source: Ambulatory Visit | Attending: Cardiology | Admitting: Cardiology

## 2014-06-23 DIAGNOSIS — Z5189 Encounter for other specified aftercare: Secondary | ICD-10-CM | POA: Diagnosis not present

## 2014-06-25 ENCOUNTER — Encounter (HOSPITAL_COMMUNITY)
Admission: RE | Admit: 2014-06-25 | Discharge: 2014-06-25 | Disposition: A | Discharge status: Home / Self Care | Payer: BC Managed Care – PPO | Source: Ambulatory Visit | Attending: Cardiology | Admitting: Cardiology

## 2014-06-25 DIAGNOSIS — Z5189 Encounter for other specified aftercare: Secondary | ICD-10-CM | POA: Diagnosis not present

## 2014-06-28 ENCOUNTER — Encounter (HOSPITAL_COMMUNITY)
Admission: RE | Admit: 2014-06-28 | Discharge: 2014-06-28 | Disposition: A | Discharge status: Home / Self Care | Payer: BC Managed Care – PPO | Source: Ambulatory Visit | Attending: Cardiology | Admitting: Cardiology

## 2014-06-28 DIAGNOSIS — Z5189 Encounter for other specified aftercare: Secondary | ICD-10-CM | POA: Diagnosis not present

## 2014-06-30 ENCOUNTER — Encounter (HOSPITAL_COMMUNITY)
Admission: RE | Admit: 2014-06-30 | Discharge: 2014-06-30 | Disposition: A | Discharge status: Home / Self Care | Payer: BC Managed Care – PPO | Source: Ambulatory Visit | Attending: Cardiology | Admitting: Cardiology

## 2014-06-30 ENCOUNTER — Encounter (HOSPITAL_COMMUNITY): Payer: Self-pay

## 2014-06-30 DIAGNOSIS — Z5189 Encounter for other specified aftercare: Secondary | ICD-10-CM | POA: Diagnosis not present

## 2014-06-30 NOTE — Progress Notes (Signed)
Pt graduated from cardiac rehab program  with completion of  27 exercise sessions in Phase II. Pt maintained good attendance and progressed nicely during her participation in rehab as evidenced by increased MET level.   Medication list reconciled. Repeat  PHQ score-0  .  Pt has made significant lifestyle changes and should be commended for her success. Pt feels she has not achieved her goals during cardiac rehab, she would like to be able to do more.  Pt was accustomed to very active lifestyle prior to her surgery which included mountain hiking.  Pt encouraged to continue exercise to achieve her goals.   Pt plans to continue exercise in cardiac maintenance program for 1 month and then transition to exercising on her own.

## 2014-07-05 ENCOUNTER — Encounter (HOSPITAL_COMMUNITY)
Admission: RE | Admit: 2014-07-05 | Discharge: 2014-07-05 | Disposition: A | Discharge status: Home / Self Care | Payer: BC Managed Care – PPO | Source: Ambulatory Visit | Attending: Cardiology | Admitting: Cardiology

## 2014-07-05 DIAGNOSIS — Z5189 Encounter for other specified aftercare: Secondary | ICD-10-CM | POA: Diagnosis not present

## 2014-07-07 ENCOUNTER — Encounter (HOSPITAL_COMMUNITY)
Admission: RE | Admit: 2014-07-07 | Discharge: 2014-07-07 | Disposition: A | Discharge status: Home / Self Care | Payer: BC Managed Care – PPO | Source: Ambulatory Visit | Attending: Cardiology | Admitting: Cardiology

## 2014-07-07 DIAGNOSIS — Z5189 Encounter for other specified aftercare: Secondary | ICD-10-CM | POA: Diagnosis not present

## 2014-07-12 ENCOUNTER — Encounter (HOSPITAL_COMMUNITY): Payer: BC Managed Care – PPO

## 2014-07-12 ENCOUNTER — Encounter (HOSPITAL_COMMUNITY)
Admission: RE | Admit: 2014-07-12 | Discharge: 2014-07-12 | Disposition: A | Discharge status: Home / Self Care | Payer: Self-pay | Source: Ambulatory Visit | Attending: Cardiology | Admitting: Cardiology

## 2014-07-12 DIAGNOSIS — Z791 Long term (current) use of non-steroidal anti-inflammatories (NSAID): Secondary | ICD-10-CM | POA: Insufficient documentation

## 2014-07-12 DIAGNOSIS — J45909 Unspecified asthma, uncomplicated: Secondary | ICD-10-CM | POA: Insufficient documentation

## 2014-07-12 DIAGNOSIS — I10 Essential (primary) hypertension: Secondary | ICD-10-CM | POA: Insufficient documentation

## 2014-07-12 DIAGNOSIS — E669 Obesity, unspecified: Secondary | ICD-10-CM | POA: Insufficient documentation

## 2014-07-12 DIAGNOSIS — I493 Ventricular premature depolarization: Secondary | ICD-10-CM | POA: Insufficient documentation

## 2014-07-12 DIAGNOSIS — I340 Nonrheumatic mitral (valve) insufficiency: Secondary | ICD-10-CM | POA: Insufficient documentation

## 2014-07-12 DIAGNOSIS — I447 Left bundle-branch block, unspecified: Secondary | ICD-10-CM | POA: Insufficient documentation

## 2014-07-12 DIAGNOSIS — Z87891 Personal history of nicotine dependence: Secondary | ICD-10-CM | POA: Insufficient documentation

## 2014-07-12 DIAGNOSIS — I341 Nonrheumatic mitral (valve) prolapse: Secondary | ICD-10-CM | POA: Insufficient documentation

## 2014-07-12 DIAGNOSIS — Z887 Allergy status to serum and vaccine status: Secondary | ICD-10-CM | POA: Insufficient documentation

## 2014-07-12 DIAGNOSIS — Z5189 Encounter for other specified aftercare: Secondary | ICD-10-CM | POA: Insufficient documentation

## 2014-07-12 DIAGNOSIS — I421 Obstructive hypertrophic cardiomyopathy: Secondary | ICD-10-CM | POA: Insufficient documentation

## 2014-07-12 DIAGNOSIS — Z9889 Other specified postprocedural states: Secondary | ICD-10-CM | POA: Insufficient documentation

## 2014-07-12 DIAGNOSIS — Z8249 Family history of ischemic heart disease and other diseases of the circulatory system: Secondary | ICD-10-CM | POA: Insufficient documentation

## 2014-07-12 DIAGNOSIS — Z79899 Other long term (current) drug therapy: Secondary | ICD-10-CM | POA: Insufficient documentation

## 2014-07-14 ENCOUNTER — Encounter (HOSPITAL_COMMUNITY): Payer: BC Managed Care – PPO

## 2014-07-14 ENCOUNTER — Encounter (HOSPITAL_COMMUNITY)
Admission: RE | Admit: 2014-07-14 | Discharge: 2014-07-14 | Disposition: A | Discharge status: Home / Self Care | Payer: Self-pay | Source: Ambulatory Visit | Attending: Cardiology | Admitting: Cardiology

## 2014-07-16 ENCOUNTER — Encounter (HOSPITAL_COMMUNITY)
Admission: RE | Admit: 2014-07-16 | Discharge: 2014-07-16 | Disposition: A | Discharge status: Home / Self Care | Payer: Self-pay | Source: Ambulatory Visit | Attending: Cardiology | Admitting: Cardiology

## 2014-07-16 ENCOUNTER — Encounter (HOSPITAL_COMMUNITY): Payer: BC Managed Care – PPO

## 2014-07-19 ENCOUNTER — Encounter (HOSPITAL_COMMUNITY): Payer: BC Managed Care – PPO

## 2014-07-19 ENCOUNTER — Encounter (HOSPITAL_COMMUNITY)
Admission: RE | Admit: 2014-07-19 | Discharge: 2014-07-19 | Disposition: A | Discharge status: Home / Self Care | Payer: Self-pay | Source: Ambulatory Visit | Attending: Cardiology | Admitting: Cardiology

## 2014-07-21 ENCOUNTER — Encounter (HOSPITAL_COMMUNITY): Payer: BC Managed Care – PPO

## 2014-07-21 ENCOUNTER — Encounter (HOSPITAL_COMMUNITY)
Admission: RE | Admit: 2014-07-21 | Discharge: 2014-07-21 | Disposition: A | Discharge status: Home / Self Care | Payer: Self-pay | Source: Ambulatory Visit | Attending: Cardiology | Admitting: Cardiology

## 2014-07-23 ENCOUNTER — Encounter (HOSPITAL_COMMUNITY)
Admission: RE | Admit: 2014-07-23 | Discharge: 2014-07-23 | Disposition: A | Discharge status: Home / Self Care | Payer: Self-pay | Source: Ambulatory Visit | Attending: Cardiology | Admitting: Cardiology

## 2014-07-23 ENCOUNTER — Encounter (HOSPITAL_COMMUNITY): Payer: BC Managed Care – PPO

## 2014-07-26 ENCOUNTER — Encounter (HOSPITAL_COMMUNITY): Payer: BC Managed Care – PPO

## 2014-07-26 ENCOUNTER — Encounter (HOSPITAL_COMMUNITY): Payer: Self-pay

## 2014-07-28 ENCOUNTER — Encounter (HOSPITAL_COMMUNITY)
Admission: RE | Admit: 2014-07-28 | Discharge: 2014-07-28 | Disposition: A | Discharge status: Home / Self Care | Payer: Self-pay | Source: Ambulatory Visit | Attending: Cardiology | Admitting: Cardiology

## 2014-07-28 ENCOUNTER — Encounter (HOSPITAL_COMMUNITY): Payer: BC Managed Care – PPO

## 2014-07-30 ENCOUNTER — Encounter (HOSPITAL_COMMUNITY): Payer: BC Managed Care – PPO

## 2014-07-30 ENCOUNTER — Encounter (HOSPITAL_COMMUNITY): Payer: Self-pay

## 2014-08-02 ENCOUNTER — Encounter (HOSPITAL_COMMUNITY)
Admission: RE | Admit: 2014-08-02 | Discharge: 2014-08-02 | Disposition: A | Discharge status: Home / Self Care | Payer: Self-pay | Source: Ambulatory Visit | Attending: Cardiology | Admitting: Cardiology

## 2014-08-02 ENCOUNTER — Encounter (HOSPITAL_COMMUNITY): Payer: BC Managed Care – PPO

## 2014-08-04 ENCOUNTER — Encounter (HOSPITAL_COMMUNITY)
Admission: RE | Admit: 2014-08-04 | Discharge: 2014-08-04 | Disposition: A | Discharge status: Home / Self Care | Payer: Self-pay | Source: Ambulatory Visit | Attending: Cardiology | Admitting: Cardiology

## 2014-08-04 ENCOUNTER — Encounter (HOSPITAL_COMMUNITY): Payer: BC Managed Care – PPO

## 2014-08-06 ENCOUNTER — Encounter (HOSPITAL_COMMUNITY)
Admission: RE | Admit: 2014-08-06 | Discharge: 2014-08-06 | Disposition: A | Discharge status: Home / Self Care | Payer: Self-pay | Source: Ambulatory Visit | Attending: Cardiology | Admitting: Cardiology

## 2014-08-06 ENCOUNTER — Encounter (HOSPITAL_COMMUNITY): Payer: BC Managed Care – PPO

## 2014-08-09 ENCOUNTER — Encounter (HOSPITAL_COMMUNITY): Payer: BC Managed Care – PPO

## 2014-08-09 ENCOUNTER — Encounter (HOSPITAL_COMMUNITY)
Admission: RE | Admit: 2014-08-09 | Discharge: 2014-08-09 | Disposition: A | Discharge status: Home / Self Care | Payer: Self-pay | Source: Ambulatory Visit | Attending: Cardiology | Admitting: Cardiology

## 2014-08-09 DIAGNOSIS — Z887 Allergy status to serum and vaccine status: Secondary | ICD-10-CM | POA: Insufficient documentation

## 2014-08-09 DIAGNOSIS — Z79899 Other long term (current) drug therapy: Secondary | ICD-10-CM | POA: Insufficient documentation

## 2014-08-09 DIAGNOSIS — Z5189 Encounter for other specified aftercare: Secondary | ICD-10-CM | POA: Insufficient documentation

## 2014-08-09 DIAGNOSIS — E669 Obesity, unspecified: Secondary | ICD-10-CM | POA: Insufficient documentation

## 2014-08-09 DIAGNOSIS — I421 Obstructive hypertrophic cardiomyopathy: Secondary | ICD-10-CM | POA: Insufficient documentation

## 2014-08-09 DIAGNOSIS — I447 Left bundle-branch block, unspecified: Secondary | ICD-10-CM | POA: Insufficient documentation

## 2014-08-09 DIAGNOSIS — Z87891 Personal history of nicotine dependence: Secondary | ICD-10-CM | POA: Insufficient documentation

## 2014-08-09 DIAGNOSIS — I10 Essential (primary) hypertension: Secondary | ICD-10-CM | POA: Insufficient documentation

## 2014-08-09 DIAGNOSIS — Z791 Long term (current) use of non-steroidal anti-inflammatories (NSAID): Secondary | ICD-10-CM | POA: Insufficient documentation

## 2014-08-09 DIAGNOSIS — I493 Ventricular premature depolarization: Secondary | ICD-10-CM | POA: Insufficient documentation

## 2014-08-09 DIAGNOSIS — J45909 Unspecified asthma, uncomplicated: Secondary | ICD-10-CM | POA: Insufficient documentation

## 2014-08-09 DIAGNOSIS — Z9889 Other specified postprocedural states: Secondary | ICD-10-CM | POA: Insufficient documentation

## 2014-08-09 DIAGNOSIS — I341 Nonrheumatic mitral (valve) prolapse: Secondary | ICD-10-CM | POA: Insufficient documentation

## 2014-08-09 DIAGNOSIS — I340 Nonrheumatic mitral (valve) insufficiency: Secondary | ICD-10-CM | POA: Insufficient documentation

## 2014-08-09 DIAGNOSIS — Z8249 Family history of ischemic heart disease and other diseases of the circulatory system: Secondary | ICD-10-CM | POA: Insufficient documentation

## 2014-08-11 ENCOUNTER — Encounter (HOSPITAL_COMMUNITY)
Admission: RE | Admit: 2014-08-11 | Discharge: 2014-08-11 | Disposition: A | Discharge status: Home / Self Care | Payer: Self-pay | Source: Ambulatory Visit | Attending: Cardiology | Admitting: Cardiology

## 2014-08-11 ENCOUNTER — Encounter (HOSPITAL_COMMUNITY): Payer: BC Managed Care – PPO

## 2014-08-13 ENCOUNTER — Encounter (HOSPITAL_COMMUNITY)
Admission: RE | Admit: 2014-08-13 | Discharge: 2014-08-13 | Disposition: A | Discharge status: Home / Self Care | Payer: Self-pay | Source: Ambulatory Visit | Attending: Cardiology | Admitting: Cardiology

## 2014-08-13 ENCOUNTER — Encounter (HOSPITAL_COMMUNITY): Payer: BC Managed Care – PPO

## 2014-08-16 ENCOUNTER — Encounter (HOSPITAL_COMMUNITY)
Admission: RE | Admit: 2014-08-16 | Discharge: 2014-08-16 | Disposition: A | Discharge status: Home / Self Care | Payer: Self-pay | Source: Ambulatory Visit | Attending: Cardiology | Admitting: Cardiology

## 2014-08-18 ENCOUNTER — Encounter (HOSPITAL_COMMUNITY)
Admission: RE | Admit: 2014-08-18 | Discharge: 2014-08-18 | Disposition: A | Discharge status: Home / Self Care | Payer: Self-pay | Source: Ambulatory Visit | Attending: Cardiology | Admitting: Cardiology

## 2014-08-20 ENCOUNTER — Encounter (HOSPITAL_COMMUNITY)
Admission: RE | Admit: 2014-08-20 | Discharge: 2014-08-20 | Disposition: A | Discharge status: Home / Self Care | Payer: Self-pay | Source: Ambulatory Visit | Attending: Cardiology | Admitting: Cardiology

## 2014-08-23 ENCOUNTER — Encounter (HOSPITAL_COMMUNITY): Payer: Self-pay

## 2014-08-25 ENCOUNTER — Encounter (HOSPITAL_COMMUNITY)
Admission: RE | Admit: 2014-08-25 | Discharge: 2014-08-25 | Disposition: A | Discharge status: Home / Self Care | Payer: Self-pay | Source: Ambulatory Visit | Attending: Cardiology | Admitting: Cardiology

## 2014-08-27 ENCOUNTER — Encounter (HOSPITAL_COMMUNITY)
Admission: RE | Admit: 2014-08-27 | Discharge: 2014-08-27 | Disposition: A | Discharge status: Home / Self Care | Payer: Self-pay | Source: Ambulatory Visit | Attending: Cardiology | Admitting: Cardiology

## 2014-08-30 ENCOUNTER — Encounter (HOSPITAL_COMMUNITY)
Admission: RE | Admit: 2014-08-30 | Discharge: 2014-08-30 | Disposition: A | Discharge status: Home / Self Care | Payer: Self-pay | Source: Ambulatory Visit | Attending: Cardiology | Admitting: Cardiology

## 2014-09-01 ENCOUNTER — Encounter (HOSPITAL_COMMUNITY): Payer: Self-pay

## 2014-09-03 ENCOUNTER — Encounter (HOSPITAL_COMMUNITY): Payer: Self-pay

## 2014-09-06 ENCOUNTER — Encounter (HOSPITAL_COMMUNITY): Payer: Self-pay

## 2014-09-08 ENCOUNTER — Encounter (HOSPITAL_COMMUNITY): Payer: Self-pay

## 2014-09-10 ENCOUNTER — Encounter (HOSPITAL_COMMUNITY): Payer: Self-pay

## 2014-09-13 ENCOUNTER — Encounter (HOSPITAL_COMMUNITY): Payer: Self-pay

## 2014-09-15 ENCOUNTER — Encounter (HOSPITAL_COMMUNITY): Payer: Self-pay

## 2014-09-17 ENCOUNTER — Encounter (HOSPITAL_COMMUNITY): Payer: Self-pay

## 2014-09-17 ENCOUNTER — Other Ambulatory Visit: Payer: Self-pay | Admitting: Obstetrics & Gynecology

## 2014-09-17 DIAGNOSIS — R921 Mammographic calcification found on diagnostic imaging of breast: Secondary | ICD-10-CM

## 2014-09-20 ENCOUNTER — Encounter (HOSPITAL_COMMUNITY): Payer: Self-pay

## 2014-09-22 ENCOUNTER — Encounter (HOSPITAL_COMMUNITY): Payer: Self-pay

## 2014-09-24 ENCOUNTER — Encounter (HOSPITAL_COMMUNITY): Payer: Self-pay

## 2014-09-27 ENCOUNTER — Encounter (HOSPITAL_COMMUNITY): Payer: Self-pay

## 2014-09-29 ENCOUNTER — Encounter (HOSPITAL_COMMUNITY): Payer: Self-pay

## 2014-10-01 ENCOUNTER — Encounter (HOSPITAL_COMMUNITY): Payer: Self-pay

## 2014-10-04 ENCOUNTER — Encounter (HOSPITAL_COMMUNITY): Payer: Self-pay

## 2014-10-06 ENCOUNTER — Encounter (HOSPITAL_COMMUNITY): Payer: Self-pay

## 2014-11-04 ENCOUNTER — Ambulatory Visit
Admission: RE | Admit: 2014-11-04 | Discharge: 2014-11-04 | Disposition: A | Discharge status: Home / Self Care | Payer: Medicare Other | Source: Ambulatory Visit | Attending: Obstetrics & Gynecology | Admitting: Obstetrics & Gynecology

## 2014-11-04 ENCOUNTER — Encounter: Payer: Medicare Other | Attending: Internal Medicine | Admitting: Dietician

## 2014-11-04 ENCOUNTER — Encounter: Payer: Self-pay | Admitting: Dietician

## 2014-11-04 VITALS — Ht 61.0 in | Wt 176.8 lb

## 2014-11-04 DIAGNOSIS — Z713 Dietary counseling and surveillance: Secondary | ICD-10-CM | POA: Insufficient documentation

## 2014-11-04 DIAGNOSIS — E669 Obesity, unspecified: Secondary | ICD-10-CM

## 2014-11-04 DIAGNOSIS — R921 Mammographic calcification found on diagnostic imaging of breast: Secondary | ICD-10-CM

## 2014-11-04 DIAGNOSIS — Z6833 Body mass index (BMI) 33.0-33.9, adult: Secondary | ICD-10-CM | POA: Insufficient documentation

## 2014-11-04 NOTE — Patient Instructions (Signed)
-  Consider adding an egg sandwich or PB&J or a wrap to lunch -Snack if your hungry (always have a carbohydrate and a protein) -Consider adding 10 minute walks when able -Consider taking water aerobics -Avoid working out hungry

## 2014-11-04 NOTE — Progress Notes (Signed)
  Medical Nutrition Therapy:  Appt start time: 1500 end time:  1600.   Assessment:  Primary concerns today: Patient reports she wants to lose some weight and be healthier. Janet [name] is here with her husband today.  She lives at home with her husband, they don't go out to eat very much but they like to travel a lot through out the year.  Janet [name] had open heart surgery in September and has been trying to make changes to her diet and make healthier choices.  Janet [name] has tried weight watchers in the past and reports feeling burnt out with counting points.    Preferred Learning Style:   No preference indicated   Learning Readiness:   Ready  MEDICATIONS: see list   DIETARY INTAKE:  Usual eating pattern includes 3 meals and 0-1 snacks per day.  Avoided foods include fried foods, aspartime products, candy and sweets.  2-3 meals a week outside of home when meeting friends.      24-hr recall:  B ( AM): cereal with milk  Snk ( AM): none  L ( PM): cottage cheese with fruit Snk ( PM): none D ( PM): meat, veggies and starch, salad Snk ( PM): popcorn Beverages: water, unsweet tea  Usual physical activity: Gym 3-4 x week, stationary bike 30 min and some weight lifting   Estimated energy needs: 1800-2000 calories 200-225 g carbohydrates 135-150 g protein 50-56 g fat  Progress Towards Goal(s):  In progress.   Nutritional Diagnosis:  [place]-3.3 Overweight/obesity As related to inadequate energy intake through out the day result in possibly over eating at night.  As evidenced by report of small meals and avoiding snacks.    Intervention:  Nutrition counseling provided.  Discussed adding more food into her lunch, snacking when she is hungry and before workouts and ways to increase physical activity through out the week.  Discussed watching portions sizes, especially at dinner.  Discussed effects of not eating enough food for your body.    Goals: -Consider adding an egg sandwich or PB&J or a wrap to  lunch -Snack if your hungry (always have a carbohydrate and a protein) -Consider adding 10 minute walks when able -Consider taking water aerobics -Avoid working out hungry   Teaching Method Utilized:  Ship broker  Handouts given during visit include:  The diabetes plate method  15 g carbohydrate snacks  Meal planning card  Barriers to learning/adherence to lifestyle change: achilles tendon injury  Demonstrated degree of understanding via:  Teach Back   Monitoring/Evaluation:  Dietary intake, exercise, and body weight in 2 month(s).

## 2014-11-09 ENCOUNTER — Other Ambulatory Visit: Payer: Self-pay | Admitting: Obstetrics & Gynecology

## 2014-11-10 LAB — CYTOLOGY - PAP

## 2014-11-29 ENCOUNTER — Other Ambulatory Visit: Payer: Self-pay

## 2014-11-29 ENCOUNTER — Encounter: Payer: Self-pay | Admitting: Cardiology

## 2014-11-29 ENCOUNTER — Ambulatory Visit (INDEPENDENT_AMBULATORY_CARE_PROVIDER_SITE_OTHER): Payer: Medicare Other | Admitting: Cardiology

## 2014-11-29 VITALS — BP 132/76 | HR 88 | Ht 61.0 in | Wt 178.2 lb

## 2014-11-29 DIAGNOSIS — R0602 Shortness of breath: Secondary | ICD-10-CM | POA: Insufficient documentation

## 2014-11-29 DIAGNOSIS — Z9889 Other specified postprocedural states: Secondary | ICD-10-CM | POA: Diagnosis not present

## 2014-11-29 DIAGNOSIS — I447 Left bundle-branch block, unspecified: Secondary | ICD-10-CM | POA: Diagnosis not present

## 2014-11-29 DIAGNOSIS — R0600 Dyspnea, unspecified: Secondary | ICD-10-CM

## 2014-11-29 DIAGNOSIS — I421 Obstructive hypertrophic cardiomyopathy: Secondary | ICD-10-CM | POA: Diagnosis not present

## 2014-11-29 DIAGNOSIS — I341 Nonrheumatic mitral (valve) prolapse: Secondary | ICD-10-CM

## 2014-11-29 NOTE — Patient Instructions (Signed)
We will schedule you for PFTs  Continue your current therapy  I will see you in 6 months.

## 2014-11-29 NOTE — Progress Notes (Signed)
Janet [name] Date of Birth: 1951-04-29 Medical Record #[id number]  History of Present Illness: Janet [name] is seen for follow HOCM. She is  s/p septal myectomy and MV repair at [facility] clinic on March 09, 2014. This was performed for HOCM with severe LV outflow gradient as well as MV prolapse with severe posterior MV prolapse and severe MR. She had extended septal myectomy with triangular resection of the posterior MV leaflet with a C shaped annuloplasty ring. She did very well in the post op period. No Afib. She did develop a post op LBBB. She was anticoagulated with coumadin. Follow up Echo in December 2015 showed no LVOT gradient and only trivial MR. She is doing very well. No palpitations. No  chest pain. She does note dyspnea when she goes uphill but otherwise able to function well. She exercises at least 5 days/week walking 2 miles on a track at 3.5 mph and riding a stationary bike. She also does some lifting on machines at light weight. Having trouble losing weight.   Current Outpatient Prescriptions on File Prior to Visit  Medication Sig Dispense Refill  . albuterol (PROVENTIL HFA;VENTOLIN HFA) 108 (90 BASE) MCG/ACT inhaler Inhale 2 puffs into the lungs every 6 (six) hours as needed for wheezing.    Marland Kitchen aspirin 81 MG tablet Take 81 mg by mouth daily.    Marland Kitchen atorvastatin (LIPITOR) 40 MG tablet Take 20 mg by mouth daily.     . budesonide (PULMICORT) 180 MCG/ACT inhaler Inhale 2 puffs into the lungs 2 (two) times daily.     . citalopram (CELEXA) 40 MG tablet Take 40 mg by mouth daily.    . irbesartan-hydrochlorothiazide (AVALIDE) 300-12.5 MG per tablet Take 0.5 tablets by mouth daily.     No current facility-administered medications on file prior to visit.    Allergies  Allergen Reactions  . Oxycodone Hives  . Pneumococcal Vaccines Swelling    Past Medical History  Diagnosis Date  . Hypertension   . Asthma   . MVP (mitral valve prolapse)   . Dysrhythmia     palpitations from MVP  .  High cholesterol     takes lipitor  . Mitral insufficiency     severe  . PVC's (premature ventricular contractions)   . HOCM (hypertrophic obstructive cardiomyopathy)   . LBBB (left bundle branch block)   . Obesity   . Melanoma in situ of left upper extremity   . S/P MVR (mitral valve repair) 04/06/2014    Past Surgical History  Procedure Laterality Date  . Appendectomy    . Nailbed repair Right 10/14/2012    Procedure: BIOPSY NAIL MATRIX RIGHT THUMB;  Surgeon: Wynonia Sours, MD;  Location: [place];  Service: Orthopedics;  Laterality: Right;  . Tee without cardioversion N/A 11/10/2013    Procedure: TRANSESOPHAGEAL ECHOCARDIOGRAM (TEE);  Surgeon: Dorothy Spark, MD;  Location: [facility] ENDOSCOPY;  Service: Cardiovascular;  Laterality: N/A;  . Septal myectomy, mv repair  [facility]/09/14    [facility]    History  Smoking status  . Former Smoker  Smokeless tobacco  . Not on file    History  Alcohol Use: Not on file    Family History  Problem Relation Age of Onset  . Arrhythmia Father   . Arrhythmia Brother   . Heart disease Brother     SBE, MV repair    Review of Systems: As noted in HPI.  All other systems were reviewed and are negative.  Physical Exam: BP  132/76 mmHg  Pulse 88  Ht 5\' 1"  (1.549 m)  Wt 80.831 kg (178 lb 3.2 oz)  BMI 33.69 kg/m2 She is a pleasant overweight WF in NAD.  HEENT: [place]/AT, PERRL, EOMI, sclera are clear. Oropharynx is clear. Neck: No JVD, adenopathy, thyromegaly, or bruits.  Lungs: clear CV: RRR, normal S1-S2. There is no significant murmur. The sternal incision is healing well. Abd: soft, NT. BS +. No masses or HSM Ext: no cyanosis or edema. Pulses 2+ Skin: warm and dry. No rashes. Neuro: alert and oriented x 3. CN II-XII intact. Nonfocal.  LABORATORY DATA: Echo 06/11/15:Study Conclusions  - Left ventricle: The cavity size was normal. There was mild focal basal and moderate concentric hypertrophy of the septum.  Systolic function was normal. The estimated ejection fraction was in the range of 60% to 65%. Wall motion was normal; there were no regional wall motion abnormalities. Doppler parameters are consistent with abnormal left ventricular relaxation (grade 1 diastolic dysfunction). The E/e&' ratio is >15, suggesting elevated LV filling pressure. - Mitral valve: S/p repair - possible central stitch, ?Alfieri repair. Trivial regurgitation. - Left atrium: LA Volume/BSA= 26.1 ml/m2. The atrium was normal in size. - Inferior vena cava: The vessel was normal in size. The respirophasic diameter changes were in the normal range (= 50%), consistent with normal central venous pressure.  Impressions:  - Compared to the prior echo in 11/2013, the MV is status post repair (?Alfieri stich) with trivial, if any regurgitation. There is no evidence for dynamic LVOT gradient and there is moderate septal wall thickening.   Assessment / Plan: 1. MV prolapse with severe mitral insufficiency.Now s/p posterior MV leaflet repair. Trivial residual insufficiency. Murmur resolved. Exam is without murmur.  2. HOCM s/p septal myectomy. No residual gradient.  3. PVCs- symptoms improved post op.  4. Asthma- some dyspnea on exertion. Will schedule PFTs- last checked over 20 years ago. On Pulmacort and prn albuterol.  5. HTN - controlled on Avalide.  She is doing very well.  Follow up in 6 months.

## 2014-11-30 ENCOUNTER — Other Ambulatory Visit (HOSPITAL_COMMUNITY): Payer: Self-pay | Admitting: Respiratory Therapy

## 2014-12-10 ENCOUNTER — Telehealth: Payer: Self-pay | Admitting: Cardiology

## 2014-12-10 NOTE — Telephone Encounter (Signed)
New message    Patient calling status of upcoming procedure / has it been precret by her insurance yet.

## 2014-12-10 NOTE — Telephone Encounter (Signed)
Per Dorian Pod, percertification dept, PFT does not require pre-authorization.

## 2014-12-16 ENCOUNTER — Ambulatory Visit (HOSPITAL_COMMUNITY)
Admission: RE | Admit: 2014-12-16 | Discharge: 2014-12-16 | Disposition: A | Discharge status: Home / Self Care | Payer: Medicare Other | Source: Ambulatory Visit | Attending: Cardiology | Admitting: Cardiology

## 2014-12-16 DIAGNOSIS — Z9889 Other specified postprocedural states: Secondary | ICD-10-CM | POA: Diagnosis not present

## 2014-12-16 DIAGNOSIS — R0600 Dyspnea, unspecified: Secondary | ICD-10-CM | POA: Diagnosis present

## 2014-12-16 LAB — PULMONARY FUNCTION TEST
DL/VA % pred: 133 %
DL/VA: 5.65 ml/min/mmHg/L
DLCO unc % pred: 104 %
DLCO unc: 19.64 ml/min/mmHg
FEF 25-75 Post: 1.27 L/sec
FEF 25-75 Pre: 0.87 L/sec
FEF2575-%Change-Post: 45 %
FEF2575-%Pred-Post: 66 %
FEF2575-%Pred-Pre: 46 %
FEV1-%Change-Post: 10 %
FEV1-%PRED-POST: 83 %
FEV1-%PRED-PRE: 75 %
FEV1-POST: 1.7 L
FEV1-Pre: 1.54 L
FEV1FVC-%Change-Post: 4 %
FEV1FVC-%Pred-Pre: 87 %
FEV6-%Change-Post: 7 %
FEV6-%PRED-POST: 93 %
FEV6-%Pred-Pre: 87 %
FEV6-Post: 2.4 L
FEV6-Pre: 2.24 L
FEV6FVC-%Change-Post: 1 %
FEV6FVC-%PRED-PRE: 101 %
FEV6FVC-%Pred-Post: 103 %
FVC-%CHANGE-POST: 5 %
FVC-%PRED-POST: 90 %
FVC-%Pred-Pre: 85 %
FVC-POST: 2.42 L
FVC-Pre: 2.29 L
POST FEV1/FVC RATIO: 70 %
POST FEV6/FVC RATIO: 99 %
PRE FEV6/FVC RATIO: 98 %
Pre FEV1/FVC ratio: 67 %
RV % pred: 119 %
RV: 2.25 L
TLC % pred: 106 %
TLC: 4.75 L

## 2014-12-16 MED ORDER — ALBUTEROL SULFATE (2.5 MG/3ML) 0.083% IN NEBU
2.5000 mg | INHALATION_SOLUTION | Freq: Once | RESPIRATORY_TRACT | Status: AC
  Administered 2014-12-16: 2.5 mg via RESPIRATORY_TRACT

## 2015-01-04 ENCOUNTER — Ambulatory Visit: Payer: Medicare Other | Admitting: Dietician

## 2015-05-31 ENCOUNTER — Encounter: Payer: Self-pay | Admitting: Cardiology

## 2015-05-31 ENCOUNTER — Ambulatory Visit (INDEPENDENT_AMBULATORY_CARE_PROVIDER_SITE_OTHER): Payer: Medicare Other | Admitting: Cardiology

## 2015-05-31 VITALS — BP 138/80 | HR 76 | Ht 61.0 in | Wt 172.4 lb

## 2015-05-31 DIAGNOSIS — I447 Left bundle-branch block, unspecified: Secondary | ICD-10-CM | POA: Diagnosis not present

## 2015-05-31 DIAGNOSIS — I341 Nonrheumatic mitral (valve) prolapse: Secondary | ICD-10-CM

## 2015-05-31 DIAGNOSIS — Z9889 Other specified postprocedural states: Secondary | ICD-10-CM

## 2015-05-31 DIAGNOSIS — I10 Essential (primary) hypertension: Secondary | ICD-10-CM

## 2015-05-31 DIAGNOSIS — I421 Obstructive hypertrophic cardiomyopathy: Secondary | ICD-10-CM

## 2015-05-31 NOTE — Progress Notes (Signed)
Janet [name] Date of Birth: 01-05-1951 Medical Record [id number]  History of Present Illness: Janet [name] is seen for follow HOCM. She is  s/p septal myectomy and MV repair at [facility] clinic on March 09, 2014. This was performed for HOCM with severe LV outflow gradient as well as MV prolapse with severe posterior MV prolapse and severe MR. She had extended septal myectomy with triangular resection of the posterior MV leaflet with a C shaped annuloplasty ring. She did very well in the post op period. No Afib. She did develop a post op LBBB. She was anticoagulated with coumadin. Follow up Echo in December 2015 showed no LVOT gradient and only trivial MR.  On follow up today she is doing very well. No palpitations. No  chest pain. She does note some dyspnea at times.  She exercises at least 5 days/week walking 2 miles on a track at 3.5 mph and riding a stationary bike. She also does some lifting on machines at light weight. She has lost 6 lbs. PFTs were normal.   Current Outpatient Prescriptions on File Prior to Visit  Medication Sig Dispense Refill  . albuterol (PROVENTIL HFA;VENTOLIN HFA) 108 (90 BASE) MCG/ACT inhaler Inhale 2 puffs into the lungs every 6 (six) hours as needed for wheezing.    Marland Kitchen aspirin 81 MG tablet Take 81 mg by mouth daily.    Marland Kitchen atorvastatin (LIPITOR) 40 MG tablet Take 20 mg by mouth daily.     . budesonide (PULMICORT) 180 MCG/ACT inhaler Inhale 2 puffs into the lungs 2 (two) times daily.     . citalopram (CELEXA) 40 MG tablet Take 40 mg by mouth daily.    . irbesartan-hydrochlorothiazide (AVALIDE) 300-12.5 MG per tablet Take 0.5 tablets by mouth daily.    . Omega-3 Fatty Acids (FISH OIL) 1200 MG CAPS Take 1 capsule by mouth daily.     No current facility-administered medications on file prior to visit.    Allergies  Allergen Reactions  . Oxycodone Hives  . Pneumococcal Vaccines Swelling    Past Medical History  Diagnosis Date  . Hypertension   . Asthma   . MVP  (mitral valve prolapse)   . Dysrhythmia     palpitations from MVP  . High cholesterol     takes lipitor  . Mitral insufficiency     severe  . PVC's (premature ventricular contractions)   . HOCM (hypertrophic obstructive cardiomyopathy) ([place])   . LBBB (left bundle branch block)   . Obesity   . Melanoma in situ of left upper extremity ([place])   . S/P MVR (mitral valve repair) 04/06/2014    Past Surgical History  Procedure Laterality Date  . Appendectomy    . Nailbed repair Right 10/14/2012    Procedure: BIOPSY NAIL MATRIX RIGHT THUMB;  Surgeon: Wynonia Sours, MD;  Location: [place];  Service: Orthopedics;  Laterality: Right;  . Tee without cardioversion N/A 11/10/2013    Procedure: TRANSESOPHAGEAL ECHOCARDIOGRAM (TEE);  Surgeon: Dorothy Spark, MD;  Location: [facility] ENDOSCOPY;  Service: Cardiovascular;  Laterality: N/A;  . Septal myectomy, mv repair  03/09/14    [facility]    History  Smoking status  . Former Smoker  Smokeless tobacco  . Not on file    History  Alcohol Use: Not on file    Family History  Problem Relation Age of Onset  . Arrhythmia Father   . Arrhythmia Brother   . Heart disease Brother     SBE, MV repair  Review of Systems: As noted in HPI.  All other systems were reviewed and are negative.  Physical Exam: BP 138/80 mmHg  Pulse 76  Ht 5\' 1"  (1.549 m)  Wt 78.189 kg (172 lb 6 oz)  BMI 32.59 kg/m2 She is a pleasant overweight WF in NAD.  HEENT: [place]/AT, PERRL, EOMI, sclera are clear. Oropharynx is clear. Neck: No JVD, adenopathy, thyromegaly, or bruits.  Lungs: clear CV: RRR, normal S1-S2. There is no murmur. The sternal incision is healing well. Abd: soft, NT. BS +. No masses or HSM Ext: no cyanosis or edema. Pulses 2+ Skin: warm and dry. No rashes. Neuro: alert and oriented x 3. CN II-XII intact. Nonfocal.  LABORATORY DATA: Echo 06/11/15:Study Conclusions  - Left ventricle: The cavity size was normal. There was mild  focal basal and moderate concentric hypertrophy of the septum. Systolic function was normal. The estimated ejection fraction was in the range of 60% to 65%. Wall motion was normal; there were no regional wall motion abnormalities. Doppler parameters are consistent with abnormal left ventricular relaxation (grade 1 diastolic dysfunction). The E/e&' ratio is >15, suggesting elevated LV filling pressure. - Mitral valve: S/p repair - possible central stitch, ?Alfieri repair. Trivial regurgitation. - Left atrium: LA Volume/BSA= 26.1 ml/m2. The atrium was normal in size. - Inferior vena cava: The vessel was normal in size. The respirophasic diameter changes were in the normal range (= 50%), consistent with normal central venous pressure.  Impressions:  - Compared to the prior echo in 11/2013, the MV is status post repair (?Alfieri stich) with trivial, if any regurgitation. There is no evidence for dynamic LVOT gradient and there is moderate septal wall thickening.   Assessment / Plan: 1. MV prolapse with severe mitral insufficiency.Now s/p posterior MV leaflet repair. Trivial residual insufficiency. Murmur resolved. Exam is without murmur.  2. HOCM s/p septal myectomy. No residual gradient.  3. PVCs- symptoms improved post op.  4. Asthma- normal PFTs  5. HTN - controlled on Avalide.  6. LBBB. Persistent by Ecg 10/28/14  She is doing very well.  Follow up in 6 months.

## 2015-07-10 HISTORY — PX: BREAST EXCISIONAL BIOPSY: SUR124

## 2015-07-10 HISTORY — PX: SALPINGOOPHORECTOMY: SHX82

## 2015-09-27 ENCOUNTER — Other Ambulatory Visit: Payer: Self-pay

## 2015-09-27 DIAGNOSIS — Z1231 Encounter for screening mammogram for malignant neoplasm of breast: Secondary | ICD-10-CM

## 2015-10-03 ENCOUNTER — Other Ambulatory Visit: Payer: Self-pay | Admitting: Plastic Surgery

## 2015-11-07 ENCOUNTER — Ambulatory Visit
Admission: RE | Admit: 2015-11-07 | Discharge: 2015-11-07 | Disposition: A | Discharge status: Home / Self Care | Payer: Medicare Other | Source: Ambulatory Visit

## 2015-11-07 ENCOUNTER — Other Ambulatory Visit: Payer: Self-pay | Admitting: Obstetrics & Gynecology

## 2015-11-07 DIAGNOSIS — N649 Disorder of breast, unspecified: Secondary | ICD-10-CM

## 2015-11-07 DIAGNOSIS — Z1231 Encounter for screening mammogram for malignant neoplasm of breast: Secondary | ICD-10-CM

## 2015-11-08 ENCOUNTER — Ambulatory Visit
Admission: RE | Admit: 2015-11-08 | Discharge: 2015-11-08 | Disposition: A | Discharge status: Home / Self Care | Payer: Medicare Other | Source: Ambulatory Visit | Attending: Obstetrics & Gynecology | Admitting: Obstetrics & Gynecology

## 2015-11-08 DIAGNOSIS — N649 Disorder of breast, unspecified: Secondary | ICD-10-CM

## 2015-11-28 ENCOUNTER — Ambulatory Visit: Payer: Self-pay | Admitting: Surgery

## 2015-11-28 NOTE — H&P (Signed)
Janet [name] 11/28/2015 10:59 AM Location: [facility] Patient #: [id number] DOB: Feb 01, 1951 Married / Language: [name] / Race: White Female  History of Present Illness [name] A. Analyah Mcconnon MD; 11/28/2015 11:37 AM) Patient words: Patient sent at the request of Dr. Shelly Bombard for a mass in the patient's right nipple. The patient noticed a small mass involving the tip of her right nipple over the last 1-2 months. It is not been painful and she has had no drainage from the right nipple. She denies any other breast lumps, masses or pain. She did have a mother and grandmother with breast cancer. Her mother was in her 49s and her grandmother was in her 21s. Her mother died of breast cancer. There is no other significant cancer history in the family. She was offered genetic screening but was talked out of it. it               CLINICAL DATA: 66 year old female with a palpable abnormality involving the right nipple. This is been present for the past several months. The patient is a concerned regarding this new palpable finding and states she has a strong family history of breast cancer.  EXAM: 2D DIGITAL DIAGNOSTIC BILATERAL MAMMOGRAM WITH CAD AND ADJUNCT TOMO  RIGHT BREAST ULTRASOUND  COMPARISON: Previous exam(s).  ACR Breast Density Category b: There are scattered areas of fibroglandular density.  FINDINGS: No suspicious masses or calcifications are seen in either breast. There is no mammographic evidence of malignancy in either breast.  Mammographic images were processed with CAD.  Physical examination at site of concern reveals a small firm nodule involving the right nipple. No drainage could be expressed from this nodule.  Targeted ultrasound of the retroareolar right breast does not reveal any masses or abnormalities.  IMPRESSION: 1. Palpable abnormality/nodule involving the right nipple which the patient states is new.  2. No mammographic  evidence of malignancy in either breast.  RECOMMENDATION: Recommend further evaluation of the new palpable nodule involving the right nipple be based on clinical assessment. The patient has a strong family history of breast cancer and given patient concern she will be referred to a breast surgeon for consideration of a punch biopsy.  I have discussed the findings and recommendations with the patient. Results were also provided in writing at the conclusion of the visit. If applicable, a reminder letter will be sent to the patient regarding the next appointment.  BI-RADS CATEGORY 0: Incomplete. Need additional imaging evaluation and/or prior mammograms for comparison.   Electronically Signed By: [name] M.D.       CLINICAL DATA: 65 year old female with a palpable abnormality involving the right nipple. This is been present for the past several months. The patient is a concerned regarding this new palpable finding and states she has a strong family history of breast cancer. EXAM: 2D DIGITAL DIAGNOSTIC BILATERAL MAMMOGRAM WITH CAD AND ADJUNCT TOMO RIGHT BREAST ULTRASOUND COMPARISON: Previous exam(s). ACR Breast Density Category b: There are scattered areas of fibroglandular density. FINDINGS: No suspicious masses or calcifications are seen in either breast. There is no mammographic evidence of malignancy in either breast. Mammographic images were processed with CAD. Physical examination at site of concern reveals a small firm nodule involving the right nipple. No drainage could be expressed from this nodule. Targeted ultrasound of the retroareolar right breast does not reveal any masses or abnormalities. IMPRESSION: 1. Palpable abnormality/nodule involving the right nipple which the patient states is new. 2. No mammographic evidence of malignancy in either breast.  RECOMMENDATION: Recommend further evaluation of the new palpable nodule involving the right  nipple be based on clinical assessment. The patient has a strong family history of breast cancer and given patient concern she will be referred to a breast surgeon for consideration of a punch biopsy. I have discussed the findings and recommendations with the patient. Results were also provided in writing at the conclusion of the visit. If applicable, a reminder letter will be sent to the patient regarding the next appointment. BI-RADS CATEGORY 0: Incomplete. Need additional imaging evaluation and/or prior mammograms for comparison. Electronically Signed By: [name] M.D. On: 11/08/2015 17:03 .  The patient is a 65 year old female.   Other Problems [name], CMA; 11/28/2015 10:59 AM) Anxiety Disorder Asthma Heart murmur High blood pressure Hypercholesterolemia Melanoma  Past Surgical History [name], CMA; 11/28/2015 10:59 AM) Appendectomy Breast Biopsy Right. Valve Replacement  Diagnostic Studies History [name], [place]; 11/28/2015 10:59 AM) Colonoscopy 1-5 years ago Mammogram within last year  Allergies [name], CMA; 11/28/2015 11:00 AM) OxyCODONE HCl *ANALGESICS - OPIOID* Pneumovax 23 *VACCINES*  Medication History [name], CMA; 11/28/2015 11:02 AM) Citalopram Hydrobromide (40MG  Tablet, Oral) Active. Irbesartan-Hydrochlorothiazide (300-12.5MG  Tablet, Oral) Active. Albuterol Sulfate (108 (90 Base)MCG/ACT Aero Pow Br Act, Inhalation) Active. Amoxicillin (500MG  Tablet, Oral) Active. Aspirin (81MG  Tablet, Oral) Active. Fish Oil (1000MG  Capsule, Oral) Active. Pulmicort Flexhaler (180MCG/ACT Aero Pow Br Act, Inhalation) Active. Vitamin D3 (2000UNIT Capsule, Oral) Active. Atorvastatin Calcium (20MG  Tablet, Oral) Active. CoQ10 (100MG  Capsule, Oral) Active. Medications Reconciled  Social History [name], [place]; 11/28/2015 10:59 AM) Alcohol use Occasional alcohol use. Caffeine use Tea. No drug use Tobacco use  Former smoker.  Family History [name], [place]; 11/28/2015 10:59 AM) Arthritis Father, Mother. Breast Cancer Mother. Depression Daughter, Mother. Heart Disease Brother, Father. Hypertension Father, Mother.  Pregnancy / Birth History [name], CMA; 11/28/2015 10:59 AM) Age at menarche 71 years. Age of menopause 64-60 Contraceptive History Oral contraceptives. Gravida 2 Maternal age 89-25 Para 2     Review of Systems [name] CMA; 11/28/2015 10:59 AM) General Not Present- Appetite Loss, Chills, Fatigue, Fever, Night Sweats, Weight Gain and Weight Loss. HEENT Present- Ringing in the Ears, Seasonal Allergies and Wears glasses/contact lenses. Not Present- Earache, Hearing Loss, Hoarseness, Nose Bleed, Oral Ulcers, Sinus Pain, Sore Throat, Visual Disturbances and Yellow Eyes. Respiratory Not Present- Bloody sputum, Chronic Cough, Difficulty Breathing, Snoring and Wheezing. Breast Present- Skin Changes. Not Present- Breast Mass, Breast Pain and Nipple Discharge. Cardiovascular Not Present- Chest Pain, Difficulty Breathing Lying Down, Leg Cramps, Palpitations, Rapid Heart Rate, Shortness of Breath and Swelling of Extremities. Gastrointestinal Not Present- Abdominal Pain, Bloating, Bloody Stool, Change in Bowel Habits, Chronic diarrhea, Constipation, Difficulty Swallowing, Excessive gas, Gets full quickly at meals, Hemorrhoids, Indigestion, Nausea, Rectal Pain and Vomiting. Female Genitourinary Not Present- Frequency, Nocturia, Painful Urination, Pelvic Pain and Urgency. Musculoskeletal Not Present- Back Pain, Joint Pain, Joint Stiffness, Muscle Pain, Muscle Weakness and Swelling of Extremities. Neurological Not Present- Decreased Memory, Fainting, Headaches, Numbness, Seizures, Tingling, Tremor, Trouble walking and Weakness. Psychiatric Present- Anxiety. Not Present- Bipolar, Change in Sleep Pattern, Depression, Fearful and Frequent crying. Endocrine Present- Hot flashes. Not  Present- Cold Intolerance, Excessive Hunger, Hair Changes, Heat Intolerance and New Diabetes. Hematology Not Present- Easy Bruising, Excessive bleeding, Gland problems, HIV and Persistent Infections.  Vitals [name] CMA; 11/28/2015 11:03 AM) 11/28/2015 11:02 AM Weight: 173 lb Height: 61in Body Surface Area: 1.78 m Body Mass Index: 32.69 kg/m  Temp.: 97.38F  Pulse:  68 (Regular)  BP: 130/70 (Sitting, Left Arm, Standard)      Physical Exam (Sundi Slevin A. Chrsitopher Wik MD; 11/28/2015 11:38 AM)  General Mental Status-Alert. General Appearance-Consistent with stated age. Hydration-Well hydrated. Voice-Normal.  Eye Eyeball - Bilateral-Extraocular movements intact. Sclera/Conjunctiva - Bilateral-No scleral icterus.  Chest and Lung Exam Chest and lung exam reveals -quiet, even and easy respiratory effort with no use of accessory muscles and on auscultation, normal breath sounds, no adventitious sounds and normal vocal resonance. Inspection Chest Wall - Normal. Back - normal. Note: Sternotomy scar noted.  Breast Note: 1 cm cystic mass in the tip of the right nipple. No drainage. Right breast otherwise normal. Left breast normal. Left nipple normal.  Cardiovascular Note: Faint murmur heard. Rate is regular.  Lymphatic Axillary  General Axillary Region: Bilateral - Description - Normal. Tenderness - Non Tender.    Assessment & Plan (Tiandra Swoveland A. Odette Watanabe MD; 11/28/2015 11:40 AM)  NIPPLE LESION (N64.9) Impression: Cystic lesion right nipple measuring 1 cm. Discussed observation versus biopsy. This is not amendable to a punch biopsy. Given strong family history, I recommend excision of the mass on an outpatient basis. Discussed observation. Discussed the risk of surgery with the patient and her husband today. Discussed the potential risk of malignancy being 3-5% circumstance. They both discussed and agreed to proceed with excision of right nipple mass. Risk of  surgery include bleeding, infection, seroma, more surgery, wound care, cosmetic deformity and the need for other treatments, death , blood clots, death. Pt agrees to proceed.  Current Plans You are being scheduled for surgery - Our schedulers will call you.  You should hear from our office's scheduling department within 5 working days about the location, date, and time of surgery. We try to make accommodations for patient's preferences in scheduling surgery, but sometimes the OR schedule or the surgeon's schedule prevents [place] from making those accommodations.  If you have not heard from our office [phone]) in 5 working days, call the office and ask for your surgeon's nurse.  If you have other questions about your diagnosis, plan, or surgery, call the office and ask for your surgeon's nurse.  Pt Education - [facility] Breast Biopsy HCI: discussed with patient and provided information. FAMILY HISTORY OF BREAST CANCER IN MOTHER (Z80.3)

## 2015-12-08 DIAGNOSIS — N649 Disorder of breast, unspecified: Secondary | ICD-10-CM

## 2015-12-08 HISTORY — DX: Disorder of breast, unspecified: N64.9

## 2015-12-19 ENCOUNTER — Ambulatory Visit
Admission: RE | Admit: 2015-12-19 | Discharge: 2015-12-19 | Disposition: A | Discharge status: Home / Self Care | Payer: Medicare Other | Source: Ambulatory Visit | Attending: Internal Medicine | Admitting: Internal Medicine

## 2015-12-19 ENCOUNTER — Other Ambulatory Visit: Payer: Self-pay | Admitting: Internal Medicine

## 2015-12-19 DIAGNOSIS — M5412 Radiculopathy, cervical region: Secondary | ICD-10-CM

## 2015-12-21 ENCOUNTER — Other Ambulatory Visit: Payer: Self-pay | Admitting: Internal Medicine

## 2015-12-21 DIAGNOSIS — M5412 Radiculopathy, cervical region: Secondary | ICD-10-CM

## 2015-12-22 ENCOUNTER — Ambulatory Visit (HOSPITAL_COMMUNITY)
Admission: RE | Admit: 2015-12-22 | Discharge: 2015-12-22 | Disposition: A | Discharge status: Home / Self Care | Payer: Medicare Other | Source: Ambulatory Visit | Attending: Internal Medicine | Admitting: Internal Medicine

## 2015-12-22 DIAGNOSIS — M50122 Cervical disc disorder at C5-C6 level with radiculopathy: Secondary | ICD-10-CM | POA: Insufficient documentation

## 2015-12-22 DIAGNOSIS — M50123 Cervical disc disorder at C6-C7 level with radiculopathy: Secondary | ICD-10-CM | POA: Diagnosis not present

## 2015-12-22 DIAGNOSIS — M5021 Other cervical disc displacement,  high cervical region: Secondary | ICD-10-CM | POA: Diagnosis not present

## 2015-12-22 DIAGNOSIS — M5412 Radiculopathy, cervical region: Secondary | ICD-10-CM | POA: Diagnosis present

## 2015-12-22 DIAGNOSIS — M50121 Cervical disc disorder at C4-C5 level with radiculopathy: Secondary | ICD-10-CM | POA: Insufficient documentation

## 2015-12-22 DIAGNOSIS — M4802 Spinal stenosis, cervical region: Secondary | ICD-10-CM | POA: Insufficient documentation

## 2015-12-22 DIAGNOSIS — M47812 Spondylosis without myelopathy or radiculopathy, cervical region: Secondary | ICD-10-CM | POA: Insufficient documentation

## 2015-12-25 ENCOUNTER — Other Ambulatory Visit: Payer: Medicare Other

## 2015-12-26 DIAGNOSIS — I421 Obstructive hypertrophic cardiomyopathy: Secondary | ICD-10-CM | POA: Insufficient documentation

## 2015-12-29 ENCOUNTER — Encounter (HOSPITAL_BASED_OUTPATIENT_CLINIC_OR_DEPARTMENT_OTHER): Payer: Self-pay | Admitting: *Deleted

## 2015-12-29 DIAGNOSIS — S50311A Abrasion of right elbow, initial encounter: Secondary | ICD-10-CM

## 2015-12-29 HISTORY — DX: Abrasion of right elbow, initial encounter: S50.311A

## 2015-12-29 NOTE — Pre-Procedure Instructions (Signed)
To come for BMET and EKG 

## 2015-12-30 ENCOUNTER — Encounter (HOSPITAL_BASED_OUTPATIENT_CLINIC_OR_DEPARTMENT_OTHER)
Admission: RE | Admit: 2015-12-30 | Discharge: 2015-12-30 | Disposition: A | Discharge status: Home / Self Care | Payer: Medicare Other | Source: Ambulatory Visit | Attending: Surgery | Admitting: Surgery

## 2015-12-30 ENCOUNTER — Other Ambulatory Visit: Payer: Self-pay

## 2015-12-30 DIAGNOSIS — N649 Disorder of breast, unspecified: Secondary | ICD-10-CM | POA: Diagnosis not present

## 2015-12-30 DIAGNOSIS — I10 Essential (primary) hypertension: Secondary | ICD-10-CM | POA: Insufficient documentation

## 2015-12-30 DIAGNOSIS — L720 Epidermal cyst: Secondary | ICD-10-CM | POA: Diagnosis not present

## 2015-12-30 DIAGNOSIS — J45909 Unspecified asthma, uncomplicated: Secondary | ICD-10-CM | POA: Diagnosis not present

## 2015-12-30 DIAGNOSIS — Z01810 Encounter for preprocedural cardiovascular examination: Secondary | ICD-10-CM | POA: Diagnosis not present

## 2015-12-30 DIAGNOSIS — Z7951 Long term (current) use of inhaled steroids: Secondary | ICD-10-CM | POA: Diagnosis not present

## 2015-12-30 DIAGNOSIS — Z79899 Other long term (current) drug therapy: Secondary | ICD-10-CM | POA: Diagnosis not present

## 2015-12-30 DIAGNOSIS — E7800 Pure hypercholesterolemia, unspecified: Secondary | ICD-10-CM | POA: Diagnosis not present

## 2015-12-30 DIAGNOSIS — Z85820 Personal history of malignant melanoma of skin: Secondary | ICD-10-CM | POA: Diagnosis not present

## 2015-12-30 DIAGNOSIS — N63 Unspecified lump in breast: Secondary | ICD-10-CM | POA: Diagnosis present

## 2015-12-30 DIAGNOSIS — Z7982 Long term (current) use of aspirin: Secondary | ICD-10-CM | POA: Diagnosis not present

## 2015-12-30 DIAGNOSIS — Z87891 Personal history of nicotine dependence: Secondary | ICD-10-CM | POA: Diagnosis not present

## 2015-12-30 DIAGNOSIS — Z803 Family history of malignant neoplasm of breast: Secondary | ICD-10-CM | POA: Diagnosis not present

## 2015-12-30 LAB — BASIC METABOLIC PANEL
Anion gap: 6 (ref 5–15)
BUN: 15 mg/dL (ref 6–20)
CHLORIDE: 104 mmol/L (ref 101–111)
CO2: 29 mmol/L (ref 22–32)
CREATININE: 0.64 mg/dL (ref 0.44–1.00)
Calcium: 8.8 mg/dL — ABNORMAL LOW (ref 8.9–10.3)
GFR calc Af Amer: >60 mL/min (ref >60)
GFR calc non Af Amer: >60 mL/min (ref >60)
GLUCOSE: 101 mg/dL — AB (ref 65–99)
POTASSIUM: 4.4 mmol/L (ref 3.5–5.1)
SODIUM: 139 mmol/L (ref 135–145)

## 2015-12-30 NOTE — Progress Notes (Signed)
ekg reviewed by dr Lorna Few no further testing needed

## 2016-01-04 ENCOUNTER — Ambulatory Visit (HOSPITAL_BASED_OUTPATIENT_CLINIC_OR_DEPARTMENT_OTHER): Payer: Medicare Other | Admitting: Anesthesiology

## 2016-01-04 ENCOUNTER — Ambulatory Visit (HOSPITAL_BASED_OUTPATIENT_CLINIC_OR_DEPARTMENT_OTHER)
Admission: RE | Admit: 2016-01-04 | Discharge: 2016-01-04 | Disposition: A | Discharge status: Home / Self Care | Payer: Medicare Other | Source: Ambulatory Visit | Attending: Surgery | Admitting: Surgery

## 2016-01-04 ENCOUNTER — Encounter (HOSPITAL_BASED_OUTPATIENT_CLINIC_OR_DEPARTMENT_OTHER): Payer: Self-pay

## 2016-01-04 ENCOUNTER — Encounter (HOSPITAL_BASED_OUTPATIENT_CLINIC_OR_DEPARTMENT_OTHER)
Admission: RE | Disposition: A | Discharge status: Home / Self Care | Payer: Self-pay | Source: Ambulatory Visit | Attending: Surgery

## 2016-01-04 DIAGNOSIS — J45909 Unspecified asthma, uncomplicated: Secondary | ICD-10-CM | POA: Insufficient documentation

## 2016-01-04 DIAGNOSIS — I10 Essential (primary) hypertension: Secondary | ICD-10-CM | POA: Diagnosis not present

## 2016-01-04 DIAGNOSIS — E7800 Pure hypercholesterolemia, unspecified: Secondary | ICD-10-CM | POA: Insufficient documentation

## 2016-01-04 DIAGNOSIS — I341 Nonrheumatic mitral (valve) prolapse: Secondary | ICD-10-CM

## 2016-01-04 DIAGNOSIS — I5189 Other ill-defined heart diseases: Secondary | ICD-10-CM

## 2016-01-04 DIAGNOSIS — R0600 Dyspnea, unspecified: Secondary | ICD-10-CM

## 2016-01-04 DIAGNOSIS — I493 Ventricular premature depolarization: Secondary | ICD-10-CM

## 2016-01-04 DIAGNOSIS — Z803 Family history of malignant neoplasm of breast: Secondary | ICD-10-CM | POA: Insufficient documentation

## 2016-01-04 DIAGNOSIS — L720 Epidermal cyst: Secondary | ICD-10-CM | POA: Insufficient documentation

## 2016-01-04 DIAGNOSIS — Z7982 Long term (current) use of aspirin: Secondary | ICD-10-CM | POA: Insufficient documentation

## 2016-01-04 DIAGNOSIS — Z9889 Other specified postprocedural states: Secondary | ICD-10-CM

## 2016-01-04 DIAGNOSIS — Z79899 Other long term (current) drug therapy: Secondary | ICD-10-CM | POA: Insufficient documentation

## 2016-01-04 DIAGNOSIS — Z87891 Personal history of nicotine dependence: Secondary | ICD-10-CM | POA: Insufficient documentation

## 2016-01-04 DIAGNOSIS — I421 Obstructive hypertrophic cardiomyopathy: Secondary | ICD-10-CM

## 2016-01-04 DIAGNOSIS — I447 Left bundle-branch block, unspecified: Secondary | ICD-10-CM

## 2016-01-04 DIAGNOSIS — Z85820 Personal history of malignant melanoma of skin: Secondary | ICD-10-CM | POA: Insufficient documentation

## 2016-01-04 DIAGNOSIS — Z7951 Long term (current) use of inhaled steroids: Secondary | ICD-10-CM | POA: Insufficient documentation

## 2016-01-04 DIAGNOSIS — I340 Nonrheumatic mitral (valve) insufficiency: Secondary | ICD-10-CM

## 2016-01-04 HISTORY — DX: Disorder of the skin and subcutaneous tissue, unspecified: L98.9

## 2016-01-04 HISTORY — PX: MASS EXCISION: SHX2000

## 2016-01-04 HISTORY — DX: Left bundle-branch block, unspecified: I44.7

## 2016-01-04 HISTORY — DX: Other specified postprocedural states: Z98.890

## 2016-01-04 HISTORY — DX: Abrasion of right elbow, initial encounter: S50.311A

## 2016-01-04 HISTORY — DX: Nausea with vomiting, unspecified: R11.2

## 2016-01-04 HISTORY — DX: Disorder of breast, unspecified: N64.9

## 2016-01-04 SURGERY — EXCISION MASS
Anesthesia: Monitor Anesthesia Care | Site: Breast | Laterality: Right

## 2016-01-04 MED ORDER — MIDAZOLAM HCL 2 MG/2ML IJ SOLN
INTRAMUSCULAR | Status: AC
  Filled 2016-01-04: qty 2

## 2016-01-04 MED ORDER — LIDOCAINE HCL (PF) 1 % IJ SOLN
INTRAMUSCULAR | Status: AC
  Filled 2016-01-04: qty 30

## 2016-01-04 MED ORDER — BUPIVACAINE HCL (PF) 0.25 % IJ SOLN
INTRAMUSCULAR | Status: AC
  Filled 2016-01-04: qty 30

## 2016-01-04 MED ORDER — DEXAMETHASONE SODIUM PHOSPHATE 10 MG/ML IJ SOLN
INTRAMUSCULAR | Status: AC
  Filled 2016-01-04: qty 1

## 2016-01-04 MED ORDER — NAPROXEN 500 MG PO TABS: ORAL_TABLET | ORAL | Status: DC

## 2016-01-04 MED ORDER — CEFAZOLIN SODIUM-DEXTROSE 2-4 GM/100ML-% IV SOLN
INTRAVENOUS | Status: AC
  Filled 2016-01-04: qty 100

## 2016-01-04 MED ORDER — LACTATED RINGERS IV SOLN
INTRAVENOUS | Status: DC
  Administered 2016-01-04: 08:00:00 via INTRAVENOUS

## 2016-01-04 MED ORDER — MEPERIDINE HCL 25 MG/ML IJ SOLN: 6.2500 mg | INTRAMUSCULAR | Status: DC | PRN

## 2016-01-04 MED ORDER — SCOPOLAMINE 1 MG/3DAYS TD PT72
1.0000 | MEDICATED_PATCH | Freq: Once | TRANSDERMAL | Status: DC | PRN
  Administered 2016-01-04: 1.5 mg via TRANSDERMAL

## 2016-01-04 MED ORDER — LIDOCAINE 2% (20 MG/ML) 5 ML SYRINGE
INTRAMUSCULAR | Status: AC
  Filled 2016-01-04: qty 5

## 2016-01-04 MED ORDER — SCOPOLAMINE 1 MG/3DAYS TD PT72
MEDICATED_PATCH | TRANSDERMAL | Status: AC
  Filled 2016-01-04: qty 1

## 2016-01-04 MED ORDER — SODIUM BICARBONATE 4 % IV SOLN
INTRAVENOUS | Status: AC
  Filled 2016-01-04: qty 5

## 2016-01-04 MED ORDER — ONDANSETRON HCL 4 MG/2ML IJ SOLN
INTRAMUSCULAR | Status: AC
  Filled 2016-01-04: qty 2

## 2016-01-04 MED ORDER — MIDAZOLAM HCL 5 MG/5ML IJ SOLN
INTRAMUSCULAR | Status: DC | PRN
  Administered 2016-01-04: 2 mg via INTRAVENOUS

## 2016-01-04 MED ORDER — FENTANYL CITRATE (PF) 100 MCG/2ML IJ SOLN: 25.0000 ug | INTRAMUSCULAR | Status: DC | PRN

## 2016-01-04 MED ORDER — MIDAZOLAM HCL 2 MG/2ML IJ SOLN: 1.0000 mg | INTRAMUSCULAR | Status: DC | PRN

## 2016-01-04 MED ORDER — BUPIVACAINE HCL (PF) 0.25 % IJ SOLN
INTRAMUSCULAR | Status: DC | PRN
  Administered 2016-01-04: 15 mL

## 2016-01-04 MED ORDER — CHLORHEXIDINE GLUCONATE 4 % EX LIQD: 1.0000 "application " | Freq: Once | CUTANEOUS | Status: DC

## 2016-01-04 MED ORDER — ONDANSETRON HCL 4 MG/2ML IJ SOLN
INTRAMUSCULAR | Status: DC | PRN
  Administered 2016-01-04: 4 mg via INTRAVENOUS

## 2016-01-04 MED ORDER — PROPOFOL 10 MG/ML IV BOLUS
INTRAVENOUS | Status: DC | PRN
  Administered 2016-01-04 (×2): 20 mg via INTRAVENOUS
  Administered 2016-01-04: 10 mg via INTRAVENOUS

## 2016-01-04 MED ORDER — PROPOFOL 10 MG/ML IV BOLUS
INTRAVENOUS | Status: AC
  Filled 2016-01-04: qty 40

## 2016-01-04 MED ORDER — BUPIVACAINE-EPINEPHRINE (PF) 0.25% -1:200000 IJ SOLN
INTRAMUSCULAR | Status: AC
  Filled 2016-01-04: qty 60

## 2016-01-04 MED ORDER — FENTANYL CITRATE (PF) 100 MCG/2ML IJ SOLN
INTRAMUSCULAR | Status: DC | PRN
  Administered 2016-01-04: 50 ug via INTRAVENOUS

## 2016-01-04 MED ORDER — LIDOCAINE HCL (CARDIAC) 20 MG/ML IV SOLN
INTRAVENOUS | Status: DC | PRN
  Administered 2016-01-04: 50 mg via INTRAVENOUS

## 2016-01-04 MED ORDER — FENTANYL CITRATE (PF) 100 MCG/2ML IJ SOLN
INTRAMUSCULAR | Status: AC
  Filled 2016-01-04: qty 2

## 2016-01-04 MED ORDER — GLYCOPYRROLATE 0.2 MG/ML IJ SOLN: 0.2000 mg | Freq: Once | INTRAMUSCULAR | Status: DC | PRN

## 2016-01-04 MED ORDER — DEXTROSE 5 % IV SOLN
3.0000 g | INTRAVENOUS | Status: AC
  Administered 2016-01-04: 2 g via INTRAVENOUS

## 2016-01-04 MED ORDER — FENTANYL CITRATE (PF) 100 MCG/2ML IJ SOLN: 50.0000 ug | INTRAMUSCULAR | Status: DC | PRN

## 2016-01-04 SURGICAL SUPPLY — 40 items
BENZOIN TINCTURE PRP APPL 2/3 (GAUZE/BANDAGES/DRESSINGS) IMPLANT
BLADE SURG 10 STRL SS (BLADE) IMPLANT
BLADE SURG 15 STRL LF DISP TIS (BLADE) ×1 IMPLANT
BLADE SURG 15 STRL SS (BLADE) ×2
CANISTER SUCT 1200ML W/VALVE (MISCELLANEOUS) IMPLANT
CHLORAPREP W/TINT 26ML (MISCELLANEOUS) ×3 IMPLANT
CLOSURE WOUND 1/2 X4 (GAUZE/BANDAGES/DRESSINGS)
COVER BACK TABLE 60X90IN (DRAPES) ×3 IMPLANT
COVER MAYO STAND STRL (DRAPES) ×3 IMPLANT
DRAPE LAPAROTOMY 100X72 PEDS (DRAPES) ×3 IMPLANT
DRAPE UTILITY XL STRL (DRAPES) ×3 IMPLANT
ELECT COATED BLADE 2.86 ST (ELECTRODE) ×3 IMPLANT
ELECT REM PT RETURN 9FT ADLT (ELECTROSURGICAL) ×3
ELECTRODE REM PT RTRN 9FT ADLT (ELECTROSURGICAL) ×1 IMPLANT
GLOVE BIOGEL PI IND STRL 7.0 (GLOVE) ×2 IMPLANT
GLOVE BIOGEL PI IND STRL 8 (GLOVE) ×1 IMPLANT
GLOVE BIOGEL PI INDICATOR 7.0 (GLOVE) ×4
GLOVE BIOGEL PI INDICATOR 8 (GLOVE) ×2
GLOVE ECLIPSE 6.5 STRL STRAW (GLOVE) ×3 IMPLANT
GLOVE ECLIPSE 8.0 STRL XLNG CF (GLOVE) ×3 IMPLANT
GOWN STRL REUS W/ TWL LRG LVL3 (GOWN DISPOSABLE) ×2 IMPLANT
GOWN STRL REUS W/TWL LRG LVL3 (GOWN DISPOSABLE) ×4
LIQUID BAND (GAUZE/BANDAGES/DRESSINGS) ×3 IMPLANT
NEEDLE HYPO 25X1 1.5 SAFETY (NEEDLE) ×3 IMPLANT
NS IRRIG 1000ML POUR BTL (IV SOLUTION) IMPLANT
PACK BASIN DAY SURGERY FS (CUSTOM PROCEDURE TRAY) ×3 IMPLANT
PENCIL BUTTON HOLSTER BLD 10FT (ELECTRODE) ×3 IMPLANT
SLEEVE SCD COMPRESS KNEE MED (MISCELLANEOUS) ×3 IMPLANT
SPONGE LAP 4X18 X RAY DECT (DISPOSABLE) IMPLANT
STAPLER VISISTAT 35W (STAPLE) IMPLANT
STRIP CLOSURE SKIN 1/2X4 (GAUZE/BANDAGES/DRESSINGS) IMPLANT
SUT MON AB 4-0 PC3 18 (SUTURE) ×3 IMPLANT
SUT VICRYL 3-0 CR8 SH (SUTURE) ×3 IMPLANT
SUT VICRYL AB 3 0 TIES (SUTURE) IMPLANT
SYR CONTROL 10ML LL (SYRINGE) ×3 IMPLANT
TOWEL OR 17X24 6PK STRL BLUE (TOWEL DISPOSABLE) ×6 IMPLANT
TOWEL OR NON WOVEN STRL DISP B (DISPOSABLE) ×3 IMPLANT
TUBE CONNECTING 20'X1/4 (TUBING)
TUBE CONNECTING 20X1/4 (TUBING) IMPLANT
YANKAUER SUCT BULB TIP NO VENT (SUCTIONS) IMPLANT

## 2016-01-04 NOTE — Anesthesia Postprocedure Evaluation (Signed)
Anesthesia Post Note  Patient: Janet [name]  Procedure(s) Performed: Procedure(s) (LRB): EXCISION OF 1CM RIGHT NIPPLE MASS (Right)  Patient location during evaluation: PACU Anesthesia Type: General Level of consciousness: awake and alert Pain management: pain level controlled Vital Signs Assessment: post-procedure vital signs reviewed and stable Respiratory status: spontaneous breathing, nonlabored ventilation and respiratory function stable Cardiovascular status: blood pressure returned to baseline and stable Postop Assessment: no signs of nausea or vomiting Anesthetic complications: no    Last Vitals:  Filed Vitals:   01/04/16 0915 01/04/16 0938  BP: 128/76 131/63  Pulse: 75 65  Temp:  36.6 C  Resp: 14 16    Last Pain:  Filed Vitals:   01/04/16 0941  PainSc: 0-No pain                 Maelys Kinnick A

## 2016-01-04 NOTE — Transfer of Care (Signed)
Immediate Anesthesia Transfer of Care Note  Patient: DANAJAH STEHL  Procedure(s) Performed: Procedure(s): EXCISION OF 1CM RIGHT NIPPLE MASS (Right)  Patient Location: PACU  Anesthesia Type:MAC  Level of Consciousness: awake and patient cooperative  Airway & Oxygen Therapy: Patient Spontanous Breathing and Patient connected to face mask oxygen  Post-op Assessment: Report given to RN and Post -op Vital signs reviewed and stable  Post vital signs: Reviewed and stable  Last Vitals:  Filed Vitals:   01/04/16 0735 01/04/16 0900  BP: 147/69 129/68  Pulse: 84 78  Temp: 37 C   Resp: 20 14    Last Pain:  Filed Vitals:   01/04/16 0903  PainSc: 7          Complications: No apparent anesthesia complications

## 2016-01-04 NOTE — Discharge Instructions (Signed)
[facility] °Office Phone Number [phone] ° °BREAST BIOPSY/ PARTIAL MASTECTOMY: POST OP INSTRUCTIONS ° °Always review your discharge instruction sheet given to you by the facility where your surgery was performed. ° °IF YOU HAVE DISABILITY OR FAMILY LEAVE FORMS, YOU MUST BRING THEM TO THE OFFICE FOR PROCESSING.  DO NOT GIVE THEM TO YOUR DOCTOR. ° °1. A prescription for pain medication may be given to you upon discharge.  Take your pain medication as prescribed, if needed.  If narcotic pain medicine is not needed, then you may take acetaminophen (Tylenol) or ibuprofen (Advil) as needed. °2. Take your usually prescribed medications unless otherwise directed °3. If you need a refill on your pain medication, please contact your pharmacy.  They will contact our office to request authorization.  Prescriptions will not be filled after 5pm or on week-ends. °4. You should eat very light the first 24 hours after surgery, such as soup, crackers, pudding, etc.  Resume your normal diet the day after surgery. °5. Most patients will experience some swelling and bruising in the breast.  Ice packs and a good support bra will help.  Swelling and bruising can take several days to resolve.  °6. It is common to experience some constipation if taking pain medication after surgery.  Increasing fluid intake and taking a stool softener will usually help or prevent this problem from occurring.  A mild laxative (Milk of Magnesia or Miralax) should be taken according to package directions if there are no bowel movements after 48 hours. °7. Unless discharge instructions indicate otherwise, you may remove your bandages 24-48 hours after surgery, and you may shower at that time.  You may have steri-strips (small skin tapes) in place directly over the incision.  These strips should be left on the skin for 7-10 days.  If your surgeon used skin glue on the incision, you may shower in 24 hours.  The glue will flake off over the  next 2-3 weeks.  Any sutures or staples will be removed at the office during your follow-up visit. °8. ACTIVITIES:  You may resume regular daily activities (gradually increasing) beginning the next day.  Wearing a good support bra or sports bra minimizes pain and swelling.  You may have sexual intercourse when it is comfortable. °a. You may drive when you no longer are taking prescription pain medication, you can comfortably wear a seatbelt, and you can safely maneuver your car and apply brakes. °b. RETURN TO WORK:  ______________________________________________________________________________________ °9. You should see your doctor in the office for a follow-up appointment approximately two weeks after your surgery.  Your doctor’s nurse will typically make your follow-up appointment when she calls you with your pathology report.  Expect your pathology report 2-3 business days after your surgery.  You may call to check if you do not hear from us after three days. °10. OTHER INSTRUCTIONS: _______________________________________________________________________________________________ _____________________________________________________________________________________________________________________________________ °_____________________________________________________________________________________________________________________________________ °_____________________________________________________________________________________________________________________________________ ° °WHEN TO CALL YOUR DOCTOR: °1. Fever over 101.0 °2. Nausea and/or vomiting. °3. Extreme swelling or bruising. °4. Continued bleeding from incision. °5. Increased pain, redness, or drainage from the incision. ° °The clinic staff is available to answer your questions during regular business hours.  Please don’t hesitate to call and ask to speak to one of the nurses for clinical concerns.  If you have a medical emergency, go to the nearest  emergency room or call 911.  A surgeon from [facility] is always on call at the hospital. ° °For further questions, please visit [email or web address]  ° ° ° °  Post Anesthesia Home Care Instructions ° °Activity: °Get plenty of rest for the remainder of the day. A responsible adult should stay with you for 24 hours following the procedure.  °For the next 24 hours, DO NOT: °-Drive a car °-Operate machinery °-Drink alcoholic beverages °-Take any medication unless instructed by your physician °-Make any legal decisions or sign important papers. ° °Meals: °Start with liquid foods such as gelatin or soup. Progress to regular foods as tolerated. Avoid greasy, spicy, heavy foods. If nausea and/or vomiting occur, drink only clear liquids until the nausea and/or vomiting subsides. Call your physician if vomiting continues. ° °Special Instructions/Symptoms: °Your throat may feel dry or sore from the anesthesia or the breathing tube placed in your throat during surgery. If this causes discomfort, gargle with warm salt water. The discomfort should disappear within 24 hours. ° °If you had a scopolamine patch placed behind your ear for the management of post- operative nausea and/or vomiting: ° °1. The medication in the patch is effective for 72 hours, after which it should be removed.  Wrap patch in a tissue and discard in the trash. Wash hands thoroughly with soap and water. °2. You may remove the patch earlier than 72 hours if you experience unpleasant side effects which may include dry mouth, dizziness or visual disturbances. °3. Avoid touching the patch. Wash your hands with soap and water after contact with the patch. °  ° °

## 2016-01-04 NOTE — H&P (Signed)
H&P   Janet [name] (MR# [id number])      H&P Info    Author Note Status Last Update User Last Update Date/Time   Erroll Luna, MD Signed Erroll Luna, MD     H&P    Expand All Collapse All   [name]  Location: [facility] Patient #: [id number] DOB: 1950-10-04 Married / Language: Janet [name] / Race: White Female  History of Present Illness [name] A. Dara Camargo MD Patient words: Patient sent at the request of Dr. Shelly Bombard for a mass in the patient's right nipple. The patient noticed a small mass involving the tip of her right nipple over the last 1-2 months. It is not been painful and she has had no drainage from the right nipple. She denies any other breast lumps, masses or pain. She did have a mother and grandmother with breast cancer. Her mother was in her 60s and her grandmother was in her 39s. Her mother died of breast cancer. There is no other significant cancer history in the family. She was offered genetic screening but was talked out of it. it               CLINICAL DATA: 65 year old female with a palpable abnormality involving the right nipple. This is been present for the past several months. The patient is a concerned regarding this new palpable finding and states she has a strong family history of breast cancer.  EXAM: 2D DIGITAL DIAGNOSTIC BILATERAL MAMMOGRAM WITH CAD AND ADJUNCT TOMO  RIGHT BREAST ULTRASOUND  COMPARISON: Previous exam(s).  ACR Breast Density Category b: There are scattered areas of fibroglandular density.  FINDINGS: No suspicious masses or calcifications are seen in either breast. There is no mammographic evidence of malignancy in either breast.  Mammographic images were processed with CAD.  Physical examination at site of concern reveals a small firm nodule involving the right nipple. No drainage could be expressed from this nodule.  Targeted ultrasound of the retroareolar right breast does not  reveal any masses or abnormalities.  IMPRESSION: 1. Palpable abnormality/nodule involving the right nipple which the patient states is new.  2. No mammographic evidence of malignancy in either breast.  RECOMMENDATION: Recommend further evaluation of the new palpable nodule involving the right nipple be based on clinical assessment. The patient has a strong family history of breast cancer and given patient concern she will be referred to a breast surgeon for consideration of a punch biopsy.  I have discussed the findings and recommendations with the patient. Results were also provided in writing at the conclusion of the visit. If applicable, a reminder letter will be sent to the patient regarding the next appointment.  BI-RADS CATEGORY 0: Incomplete. Need additional imaging evaluation and/or prior mammograms for comparison.   Electronically Signed By: Janet [name] M.D.       CLINICAL DATA: 65 year old female with a palpable abnormality involving the right nipple. This is been present for the past several months. The patient is a concerned regarding this new palpable finding and states she has a strong family history of breast cancer. EXAM: 2D DIGITAL DIAGNOSTIC BILATERAL MAMMOGRAM WITH CAD AND ADJUNCT TOMO RIGHT BREAST ULTRASOUND COMPARISON: Previous exam(s). ACR Breast Density Category b: There are scattered areas of fibroglandular density. FINDINGS: No suspicious masses or calcifications are seen in either breast. There is no mammographic evidence of malignancy in either breast. Mammographic images were processed with CAD. Physical examination at site of concern reveals a small firm nodule involving the right nipple.  No drainage could be expressed from this nodule. Targeted ultrasound of the retroareolar right breast does not reveal any masses or abnormalities. IMPRESSION: 1. Palpable abnormality/nodule involving the right nipple which the patient states  is new. 2. No mammographic evidence of malignancy in either breast. RECOMMENDATION: Recommend further evaluation of the new palpable nodule involving the right nipple be based on clinical assessment. The patient has a strong family history of breast cancer and given patient concern she will be referred to a breast surgeon for consideration of a punch biopsy. I have discussed the findings and recommendations with the patient. Results were also provided in writing at the conclusion of the visit. If applicable, a reminder letter will be sent to the patient regarding the next appointment. BI-RADS CATEGORY 0: Incomplete. Need additional imaging evaluation and/or prior mammograms for comparison. Electronically Signed By: Janet [name] M.D. On: 11/08/2015 17:03 .  The patient is a 65 year old female.   Other Problems Janet [name], CMA; Anxiety Disorder Asthma Heart murmur High blood pressure Hypercholesterolemia Melanoma  Past Surgical History Janet [name], CMA;  Appendectomy Breast Biopsy Right. Valve Replacement  Diagnostic Studies History Janet [name], CMA;  Colonoscopy 1-5 years ago Mammogram within last year  Allergies Janet [name], [place];  OxyCODONE HCl *ANALGESICS - OPIOID* Pneumovax 23 *VACCINES*  Medication History Janet [name], CMA;  Citalopram Hydrobromide (40MG  Tablet, Oral) Active. Irbesartan-Hydrochlorothiazide (300-12.5MG  Tablet, Oral) Active. Albuterol Sulfate (108 (90 Base)MCG/ACT Aero Pow Br Act, Inhalation) Active. Amoxicillin (500MG  Tablet, Oral) Active. Aspirin (81MG  Tablet, Oral) Active. Fish Oil (1000MG  Capsule, Oral) Active. Pulmicort Flexhaler (180MCG/ACT Aero Pow Br Act, Inhalation) Active. Vitamin D3 (2000UNIT Capsule, Oral) Active. Atorvastatin Calcium (20MG  Tablet, Oral) Active. CoQ10 (100MG  Capsule, Oral) Active. Medications Reconciled  Social History Janet [name], [place];  Alcohol use Occasional alcohol  use. Caffeine use Tea. No drug use Tobacco use Former smoker.  Family History Janet [name], [place];  Arthritis Father, Mother. Breast Cancer Mother. Depression Daughter, Mother. Heart Disease Brother, Father. Hypertension Father, Mother.  Pregnancy / Birth History Janet [name], [place];  Age at menarche 42 years. Age of menopause 16-60 Contraceptive History Oral contraceptives. Gravida 2 Maternal age 38-25 Para 2     Review of Systems Janet [name] CMA;  General Not Present- Appetite Loss, Chills, Fatigue, Fever, Night Sweats, Weight Gain and Weight Loss. HEENT Present- Ringing in the Ears, Seasonal Allergies and Wears glasses/contact lenses. Not Present- Earache, Hearing Loss, Hoarseness, Nose Bleed, Oral Ulcers, Sinus Pain, Sore Throat, Visual Disturbances and Yellow Eyes. Respiratory Not Present- Bloody sputum, Chronic Cough, Difficulty Breathing, Snoring and Wheezing. Breast Present- Skin Changes. Not Present- Breast Mass, Breast Pain and Nipple Discharge. Cardiovascular Not Present- Chest Pain, Difficulty Breathing Lying Down, Leg Cramps, Palpitations, Rapid Heart Rate, Shortness of Breath and Swelling of Extremities. Gastrointestinal Not Present- Abdominal Pain, Bloating, Bloody Stool, Change in Bowel Habits, Chronic diarrhea, Constipation, Difficulty Swallowing, Excessive gas, Gets full quickly at meals, Hemorrhoids, Indigestion, Nausea, Rectal Pain and Vomiting. Female Genitourinary Not Present- Frequency, Nocturia, Painful Urination, Pelvic Pain and Urgency. Musculoskeletal Not Present- Back Pain, Joint Pain, Joint Stiffness, Muscle Pain, Muscle Weakness and Swelling of Extremities. Neurological Not Present- Decreased Memory, Fainting, Headaches, Numbness, Seizures, Tingling, Tremor, Trouble walking and Weakness. Psychiatric Present- Anxiety. Not Present- Bipolar, Change in Sleep Pattern, Depression, Fearful and Frequent crying. Endocrine Present- Hot flashes. Not  Present- Cold Intolerance, Excessive Hunger, Hair Changes, Heat Intolerance and New Diabetes. Hematology Not Present- Easy Bruising, Excessive bleeding, Gland problems, HIV and Persistent Infections.  Vitals Janet [name] CMA;  11/28/2015 11:02 AM Weight: 173 lb Height: 61in Body Surface Area: 1.78 m Body Mass Index: 32.69 kg/m  Temp.: 97.58F  Pulse: 68 (Regular)  BP: 130/70 (Sitting, Left Arm, Standard)      Physical Exam (Ashmi Blas A. Dozier Berkovich MD;   General Mental Status-Alert. General Appearance-Consistent with stated age. Hydration-Well hydrated. Voice-Normal.  Eye Eyeball - Bilateral-Extraocular movements intact. Sclera/Conjunctiva - Bilateral-No scleral icterus.  Chest and Lung Exam Chest and lung exam reveals -quiet, even and easy respiratory effort with no use of accessory muscles and on auscultation, normal breath sounds, no adventitious sounds and normal vocal resonance. Inspection Chest Wall - Normal. Back - normal. Note: Sternotomy scar noted.  Breast Note: 1 cm cystic mass in the tip of the right nipple. No drainage. Right breast otherwise normal. Left breast normal. Left nipple normal.  Cardiovascular Note: Faint murmur heard. Rate is regular.  Lymphatic Axillary  General Axillary Region: Bilateral - Description - Normal. Tenderness - Non Tender.    Assessment & Plan (Demesha Boorman A. Reisa Coppola MD;   NIPPLE LESION (N64.9) Impression: Cystic lesion right nipple measuring 1 cm. Discussed observation versus biopsy. This is not amendable to a punch biopsy. Given strong family history, I recommend excision of the mass on an outpatient basis. Discussed observation. Discussed the risk of surgery with the patient and her husband today. Discussed the potential risk of malignancy being 3-5% circumstance. They both discussed and agreed to proceed with excision of right nipple mass. Risk of surgery include bleeding, infection, seroma, more surgery,  wound care, cosmetic deformity and the need for other treatments, death , blood clots, death. Pt agrees to proceed.  Current Plans You are being scheduled for surgery - Our schedulers will call you.  You should hear from our office's scheduling department within 5 working days about the location, date, and time of surgery. We try to make accommodations for patient's preferences in scheduling surgery, but sometimes the OR schedule or the surgeon's schedule prevents [place] from making those accommodations.  If you have not heard from our office [phone]) in 5 working days, call the office and ask for your surgeon's nurse.  If you have other questions about your diagnosis, plan, or surgery, call the office and ask for your surgeon's nurse.  Pt Education - [facility] Breast Biopsy HCI: discussed with patient and provided information. FAMILY HISTORY OF BREAST CANCER IN MOTHER (Z80.3)

## 2016-01-04 NOTE — Anesthesia Procedure Notes (Signed)
Procedure Name: MAC Date/Time: 01/04/2016 8:31 AM Performed by: Marrianne Mood Pre-anesthesia Checklist: Patient identified, Timeout performed, Emergency Drugs available, Suction available and Patient being monitored Patient Re-evaluated:Patient Re-evaluated prior to inductionOxygen Delivery Method: Simple face mask

## 2016-01-04 NOTE — Op Note (Signed)
Preoperative diagnosis: Right breast nipple mass 1 cm  Postoperative diagnosis: Same   Procedure: Excision of right breast nipple mass  Surgeon: Erroll Luna M.D.  Anesthesia: Mac with 0.25% Sensorcaine plain  EBL: Minimal  Specimen: 1 cm mass from tip of right nipple to pathology  Drains: None  Indications for procedure: Patient is a 65 year old female who is had a 1 cm nipple mass for a number of months. She has a strong family history of breast cancer. Workup which included mammogram was unremarkable as well as ultrasound. She desires excision of this mass due to possible malignancy.The procedure has been discussed with the patient. Alternatives to surgery have been discussed with the patient.  Risks of surgery include bleeding,  Infection,  Seroma formation, death,  and the need for further surgery.   The patient understands and wishes to proceed.  Description of procedure: The patient was met in the holding area and questions are answered. The right breast was marked as the correct site. He was taken back to the operating room and placed upon the OR table. After induction of Mac anesthesia, the right breast was prepped and draped in a sterile fashion and timeout was done. 0.25% Sensorcaine plain anesthetic was injected under the nipple. The mass was identifiable on exam grasped Kelly clamp. The mass was excised in its entirety with grossly negative margins the tip of the nipple. Stasis was achieved in the nipple was closed with 4-0 Monocryl. Liquid adhesive applied. All final counts found to be correct. Patient was awoke taken to recovery in satisfactory condition.

## 2016-01-04 NOTE — Anesthesia Preprocedure Evaluation (Addendum)
Anesthesia Evaluation  Patient identified by MRN, date of birth, ID band Patient awake    Reviewed: Allergy & Precautions, NPO status , Patient's Chart, lab work & pertinent test results  History of Anesthesia Complications (+) PONV  Airway Mallampati: I  TM Distance: >3 FB Neck ROM: Full    Dental  (+) Teeth Intact, Dental Advisory Given   Pulmonary former smoker,    breath sounds clear to auscultation       Cardiovascular hypertension, Pt. on medications  Rhythm:Regular Rate:Normal     Neuro/Psych    GI/Hepatic   Endo/Other    Renal/GU      Musculoskeletal   Abdominal   Peds  Hematology   Anesthesia Other Findings Hx of MV repair and ring placement at [facility] in 2015.  Doing well since.  Reproductive/Obstetrics                            Anesthesia Physical Anesthesia Plan  ASA: III  Anesthesia Plan: MAC   Post-op Pain Management:    Induction: Intravenous  Airway Management Planned: Simple Face Mask  Additional Equipment:   Intra-op Plan:   Post-operative Plan:   Informed Consent: I have reviewed the patients History and Physical, chart, labs and discussed the procedure including the risks, benefits and alternatives for the proposed anesthesia with the patient or authorized representative who has indicated his/her understanding and acceptance.   Dental advisory given  Plan Discussed with: CRNA, Anesthesiologist and Surgeon  Anesthesia Plan Comments:         Anesthesia Quick Evaluation

## 2016-01-04 NOTE — Interval H&P Note (Signed)
History and Physical Interval Note:  01/04/2016 7:56 AM  Janet [name]  has presented today for surgery, with the diagnosis of RIGHT NIPPLE MASS 1CM   The various methods of treatment have been discussed with the patient and family. After consideration of risks, benefits and other options for treatment, the patient has consented to  Procedure(s): EXCISION OF 1CM RIGHT NIPPLE MASS (Right) as a surgical intervention .  The patient's history has been reviewed, patient examined, no change in status, stable for surgery.  I have reviewed the patient's chart and labs.  Questions were answered to the patient's satisfaction.     Jayron Maqueda A.

## 2016-01-05 ENCOUNTER — Ambulatory Visit (INDEPENDENT_AMBULATORY_CARE_PROVIDER_SITE_OTHER): Payer: Medicare Other | Admitting: Cardiology

## 2016-01-05 ENCOUNTER — Other Ambulatory Visit: Payer: Self-pay | Admitting: Neurosurgery

## 2016-01-05 ENCOUNTER — Encounter: Payer: Self-pay | Admitting: Cardiology

## 2016-01-05 VITALS — BP 114/65 | HR 96 | Ht 61.0 in | Wt 172.0 lb

## 2016-01-05 DIAGNOSIS — I10 Essential (primary) hypertension: Secondary | ICD-10-CM | POA: Diagnosis not present

## 2016-01-05 DIAGNOSIS — M5412 Radiculopathy, cervical region: Secondary | ICD-10-CM

## 2016-01-05 DIAGNOSIS — I493 Ventricular premature depolarization: Secondary | ICD-10-CM

## 2016-01-05 DIAGNOSIS — I421 Obstructive hypertrophic cardiomyopathy: Secondary | ICD-10-CM

## 2016-01-05 DIAGNOSIS — I340 Nonrheumatic mitral (valve) insufficiency: Secondary | ICD-10-CM | POA: Diagnosis not present

## 2016-01-05 DIAGNOSIS — I447 Left bundle-branch block, unspecified: Secondary | ICD-10-CM

## 2016-01-05 DIAGNOSIS — I341 Nonrheumatic mitral (valve) prolapse: Secondary | ICD-10-CM | POA: Diagnosis not present

## 2016-01-05 DIAGNOSIS — M5416 Radiculopathy, lumbar region: Secondary | ICD-10-CM

## 2016-01-05 NOTE — Progress Notes (Signed)
Janet [name] Date of Birth: 09/07/1950 Medical Record [id number]  History of Present Illness: Janet [name] is seen for follow HOCM. She is  s/p septal myectomy and MV repair at [facility] clinic on March 09, 2014. This was performed for HOCM with severe LV outflow gradient as well as MV prolapse with severe posterior MV prolapse and severe MR. She had extended septal myectomy with triangular resection of the posterior MV leaflet with a C shaped annuloplasty ring. She did very well in the post op period. No Afib. She did develop a post op LBBB.  Follow up Echo in December 2015 showed no LVOT gradient and only trivial MR.  On follow up today she is doing very well. She denies any palpitations, chest pain, SOB.   Her exercise has been curtailed due to sciatica and a "pinched nerve" in her shoulder. She is followed by Dr. Sherwood [name]. She had a melanoma removed from her arm. She underwent a breast biopsy yesterday and pathology is pending.    Current Outpatient Prescriptions on File Prior to Visit  Medication Sig Dispense Refill  . albuterol (PROVENTIL HFA;VENTOLIN HFA) 108 (90 BASE) MCG/ACT inhaler Inhale 2 puffs into the lungs every 6 (six) hours as needed for wheezing.    Marland Kitchen aspirin 81 MG tablet Take 81 mg by mouth daily.    Marland Kitchen atorvastatin (LIPITOR) 20 MG tablet Take 20 mg by mouth daily.    . budesonide (PULMICORT) 180 MCG/ACT inhaler Inhale 2 puffs into the lungs 2 (two) times daily.     . Cholecalciferol (HM VITAMIN D3) 4000 UNITS CAPS Take 4,000 Units by mouth daily.    . citalopram (CELEXA) 40 MG tablet Take 40 mg by mouth daily.    . irbesartan-hydrochlorothiazide (AVALIDE) 300-12.5 MG per tablet Take 1 tablet by mouth daily.     . naproxen (NAPROSYN) 500 MG tablet 1 tablet twice a day as needed for pain 20 tablet 0  . Omega-3 Fatty Acids (FISH OIL) 1200 MG CAPS Take 1 capsule by mouth daily.    . pregabalin (LYRICA) 50 MG capsule Take 50 mg by mouth 3 (three) times daily.    . traMADol (ULTRAM) 50  MG tablet Take 50 mg by mouth 5 (five) times daily.     No current facility-administered medications on file prior to visit.    Allergies  Allergen Reactions  . Oxycontin [Oxycodone] Hives  . Pneumococcal Vaccines Swelling    Past Medical History  Diagnosis Date  . High cholesterol     takes lipitor  . HOCM (hypertrophic obstructive cardiomyopathy) ([place])   . Left bundle branch block (LBBB)   . PONV (postoperative nausea and vomiting)   . Hypertension     states under control with med., has been on med. x "years"  . Nipple lesion 12/2015    right nipple mass  . Abrasion of elbow, right 12/29/2015  . Pinched nerve in neck 12/2015  . Asthma     daily and prn inhalers  . Leg skin lesion, left     mole removed 12/27/2015    Past Surgical History  Procedure Laterality Date  . Appendectomy      age 27  . Nailbed repair Right 10/14/2012    Procedure: BIOPSY NAIL MATRIX RIGHT THUMB;  Surgeon: Wynonia Sours, MD;  Location: [facility];  Service: Orthopedics;  Laterality: Right;  . Tee without cardioversion N/A 11/10/2013    Procedure: TRANSESOPHAGEAL ECHOCARDIOGRAM (TEE);  Surgeon: Dorothy Spark, MD;  Location: [facility]  ENDOSCOPY;  Service: Cardiovascular;  Laterality: N/A;  . Hysteroscopy w/d&c  07/21/2004    submucosal myomectomy  . Mitral valve repair  03/09/2014  . Cardiac surgery  03/09/2014    septal myectomy    History  Smoking status  . Former Smoker  . Quit date: 07/08/1986  Smokeless tobacco  . Never Used    History  Alcohol Use No    Family History  Problem Relation Age of Onset  . Arrhythmia Father   . Arrhythmia Brother   . Heart disease Brother     SBE, MV repair    Review of Systems: As noted in HPI.  All other systems were reviewed and are negative.  Physical Exam: There were no vitals taken for this visit. She is a pleasant overweight WF in NAD.  HEENT: [place]/AT, PERRL, EOMI, sclera are clear. Oropharynx is clear. Neck: No JVD, adenopathy,  thyromegaly, or bruits. Carotid upstrokes are normal.  Lungs: clear CV: RRR, normal S1-S2. There is a grade 2/6 systolic murmur loudest at LSB. No gallop or rub. Abd: soft, NT. BS +. No masses or HSM Ext: no cyanosis or edema. Pulses 2+ Skin: warm and dry. No rashes. Neuro: alert and oriented x 3. CN II-XII intact. Nonfocal.  LABORATORY DATA: Echo 06/11/15:Study Conclusions  - Left ventricle: The cavity size was normal. There was mild focal basal and moderate concentric hypertrophy of the septum. Systolic function was normal. The estimated ejection fraction was in the range of 60% to 65%. Wall motion was normal; there were no regional wall motion abnormalities. Doppler parameters are consistent with abnormal left ventricular relaxation (grade 1 diastolic dysfunction). The E/e&' ratio is >15, suggesting elevated LV filling pressure. - Mitral valve: S/p repair - possible central stitch, ?Alfieri repair. Trivial regurgitation. - Left atrium: LA Volume/BSA= 26.1 ml/m2. The atrium was normal in size. - Inferior vena cava: The vessel was normal in size. The respirophasic diameter changes were in the normal range (= 50%), consistent with normal central venous pressure.  Impressions:  - Compared to the prior echo in 11/2013, the MV is status post repair (?Alfieri stich) with trivial, if any regurgitation. There is no evidence for dynamic LVOT gradient and there is moderate septal wall thickening.  Ecg reviewed from 12/30/15: NSR with first degree AV block. LAD, LBBB. I have personally reviewed and interpreted this study.  Assessment / Plan: 1. MV prolapse with severe mitral insufficiency.Now s/p posterior MV leaflet repair. Trivial residual insufficiency by prior Echo. She does have a 2/6 systolic murmur at the LSB. I suspect this is probably more residual LVOT gradient. Will update Echo.   2. HOCM s/p septal myectomy. Follow up Echo  3. PVCs- resolved post  surgery.  4.  HTN - controlled on Avalide.  5. LBBB. Persistent by Ecg 10/28/14  Labs are followed by primary care and reported normal. Will plan follow up in 6 months.

## 2016-01-05 NOTE — Patient Instructions (Signed)
Continue your current therapy  We will schedule you for an Echocardiogram   I will see you in 6 months   

## 2016-01-06 ENCOUNTER — Encounter (HOSPITAL_BASED_OUTPATIENT_CLINIC_OR_DEPARTMENT_OTHER): Payer: Self-pay | Admitting: Surgery

## 2016-01-09 ENCOUNTER — Ambulatory Visit
Admission: RE | Admit: 2016-01-09 | Discharge: 2016-01-09 | Disposition: A | Discharge status: Home / Self Care | Payer: Medicare Other | Source: Ambulatory Visit | Attending: Neurosurgery | Admitting: Neurosurgery

## 2016-01-09 DIAGNOSIS — M5412 Radiculopathy, cervical region: Secondary | ICD-10-CM

## 2016-01-09 DIAGNOSIS — M5416 Radiculopathy, lumbar region: Secondary | ICD-10-CM

## 2016-01-09 MED ORDER — DIAZEPAM 5 MG PO TABS: 5.0000 mg | ORAL_TABLET | Freq: Once | ORAL | Status: DC

## 2016-01-09 MED ORDER — DIAZEPAM 5 MG PO TABS
5.0000 mg | ORAL_TABLET | Freq: Once | ORAL | Status: AC
  Administered 2016-01-09: 5 mg via ORAL

## 2016-01-09 MED ORDER — IOPAMIDOL (ISOVUE-M 300) INJECTION 61%
10.0000 mL | Freq: Once | INTRAMUSCULAR | Status: AC | PRN
  Administered 2016-01-09: 10 mL via INTRATHECAL

## 2016-01-09 NOTE — Discharge Instructions (Signed)

## 2016-01-18 ENCOUNTER — Other Ambulatory Visit: Payer: Self-pay | Admitting: Obstetrics & Gynecology

## 2016-01-24 ENCOUNTER — Other Ambulatory Visit (HOSPITAL_COMMUNITY): Payer: Medicare Other

## 2016-01-31 ENCOUNTER — Ambulatory Visit (HOSPITAL_COMMUNITY): Payer: Medicare Other | Attending: Cardiology

## 2016-01-31 ENCOUNTER — Other Ambulatory Visit (HOSPITAL_COMMUNITY): Payer: Self-pay

## 2016-01-31 DIAGNOSIS — Z87891 Personal history of nicotine dependence: Secondary | ICD-10-CM | POA: Insufficient documentation

## 2016-01-31 DIAGNOSIS — I119 Hypertensive heart disease without heart failure: Secondary | ICD-10-CM | POA: Diagnosis not present

## 2016-01-31 DIAGNOSIS — I421 Obstructive hypertrophic cardiomyopathy: Secondary | ICD-10-CM | POA: Diagnosis not present

## 2016-01-31 DIAGNOSIS — I371 Nonrheumatic pulmonary valve insufficiency: Secondary | ICD-10-CM | POA: Insufficient documentation

## 2016-01-31 DIAGNOSIS — I340 Nonrheumatic mitral (valve) insufficiency: Secondary | ICD-10-CM | POA: Diagnosis not present

## 2016-01-31 DIAGNOSIS — I447 Left bundle-branch block, unspecified: Secondary | ICD-10-CM

## 2016-01-31 DIAGNOSIS — E785 Hyperlipidemia, unspecified: Secondary | ICD-10-CM | POA: Insufficient documentation

## 2016-01-31 DIAGNOSIS — I341 Nonrheumatic mitral (valve) prolapse: Secondary | ICD-10-CM | POA: Insufficient documentation

## 2016-01-31 DIAGNOSIS — I10 Essential (primary) hypertension: Secondary | ICD-10-CM | POA: Diagnosis not present

## 2016-01-31 DIAGNOSIS — I493 Ventricular premature depolarization: Secondary | ICD-10-CM | POA: Insufficient documentation

## 2016-01-31 LAB — ECHOCARDIOGRAM COMPLETE
AOASC: 26 cm
E decel time: 317 msec
EERAT: 15.75
FS: 30 % (ref 28–44)
IVS/LV PW RATIO, ED: 1.16
LA ID, A-P, ES: 38 mm
LA diam index: 2.15 cm/m2
LA vol index: 38.4 mL/m2
LA vol: 68 mL
LAVOLA4C: 66 mL
LDCA: 2.84 cm2
LEFT ATRIUM END SYS DIAM: 38 mm
LV PW d: 13.1 mm — AB (ref 0.6–1.1)
LV TDI E'LATERAL: 6.92
LV TDI E'MEDIAL: 7.99
LVEEAVG: 15.75
LVEEMED: 15.75
LVELAT: 6.92 cm/s
LVOT VTI: 22.9 cm
LVOT diameter: 19 mm
LVOT peak grad rest: 5 mmHg
LVOTPV: 117 cm/s
LVOTSV: 65 mL
MV Dec: 317
MV pk A vel: 143 m/s
MV pk E vel: 109 m/s
MVPG: 5 mmHg
RV LATERAL S' VELOCITY: 8.48 cm/s
RV sys press: 33 mmHg
Reg peak vel: 273 cm/s
TRMAXVEL: 273 cm/s

## 2016-02-02 ENCOUNTER — Ambulatory Visit (INDEPENDENT_AMBULATORY_CARE_PROVIDER_SITE_OTHER): Payer: Medicare Other | Admitting: Neurology

## 2016-02-02 ENCOUNTER — Encounter: Payer: Self-pay | Admitting: Neurology

## 2016-02-02 DIAGNOSIS — G545 Neuralgic amyotrophy: Secondary | ICD-10-CM

## 2016-02-02 HISTORY — DX: Neuralgic amyotrophy: G54.5

## 2016-02-02 NOTE — Progress Notes (Addendum)
Reason for visit: Right arm pain  Referring physician: Dr. Lorre Munroe is a 65 y.o. female  History of present illness:  Janet [name] is a 65 year old right-handed white female with a history of onset of discomfort that came on suddenly in May 2017. The patient had spontaneous onset of significant discomfort in the right shoulder blade going down the right arm to the wrist. The patient initially was felt to have a shingles outbreak without rash. She was placed on Valtrex and a prednisone taper. The pain continued, and Lyrica was added for the pain. The patient denied neck discomfort, but the arm pain continued. The patient underwent an x-ray of the cervical spine showing arthritis changes, she was eventually set up for MRI evaluation of the cervical spine and then a CT myelogram study of the low back and cervical spine. Around this time, the patient developed pain down the right leg, she did not develop weakness. She has noted weakness in the right arm. She denies any pain down the arm with turning the head side to side, but with turning the head to the left, she does note some paresthesias down the right arm. The patient denies any changes in balance, she has had some constipation on the pain medications. There has been no change in bladder control. She currently is on Lyrica taking 150 mg twice a day and she takes Ultram for pain. She is sent to this office for an evaluation.  Past Medical History:  Diagnosis Date  . Abrasion of elbow, right 12/29/2015  . Anxiety   . Asthma    daily and prn inhalers  . High cholesterol    takes lipitor  . HOCM (hypertrophic obstructive cardiomyopathy) ([place])   . Hypertension    states under control with med., has been on med. x "years"  . Left bundle branch block (LBBB)   . Leg skin lesion, left    mole removed 12/27/2015  . Melanoma ([place])   . Nipple lesion 12/2015   right nipple mass  . Parsonage-Turner syndrome 02/02/2016   right  .  Pinched nerve in neck 12/2015  . PONV (postoperative nausea and vomiting)     Past Surgical History:  Procedure Laterality Date  . APPENDECTOMY     age 24  . CARDIAC SURGERY  03/09/2014   septal myectomy  . HYSTEROSCOPY W/D&C  07/21/2004   submucosal myomectomy  . INFERIOR OBLIQUE MYECTOMY  2015  . MASS EXCISION Right 01/04/2016   Procedure: EXCISION OF 1CM RIGHT NIPPLE MASS;  Surgeon: Erroll Luna, MD;  Location: [facility];  Service: General;  Laterality: Right;  . MITRAL VALVE REPAIR  03/09/2014  . NAILBED REPAIR Right 10/14/2012   Procedure: BIOPSY NAIL MATRIX RIGHT THUMB;  Surgeon: Wynonia Sours, MD;  Location: [place];  Service: Orthopedics;  Laterality: Right;  . SALPINGOOPHORECTOMY  2017  . TEE WITHOUT CARDIOVERSION N/A 11/10/2013   Procedure: TRANSESOPHAGEAL ECHOCARDIOGRAM (TEE);  Surgeon: Dorothy Spark, MD;  Location[facility];  Service: Cardiovascular;  Laterality: N/A;    Family History  Problem Relation Age of Onset  . Arrhythmia Father     A fib  . Hypertension Father   . Arrhythmia Brother     A fib  . Heart disease Brother     SBE, MV repair  . Breast cancer Mother   . Hypertension Mother     Social history:  reports that she quit smoking about 29 years ago.  She has never used smokeless tobacco. She reports that she does not drink alcohol or use drugs.  Medications:  Prior to Admission medications   Medication Sig Start Date End Date Taking? Authorizing Provider  albuterol (PROVENTIL HFA;VENTOLIN HFA) 108 (90 BASE) MCG/ACT inhaler Inhale 2 puffs into the lungs every 6 (six) hours as needed for wheezing.   Yes Historical Provider, MD  aspirin 81 MG tablet Take 81 mg by mouth daily.   Yes Historical Provider, MD  atorvastatin (LIPITOR) 20 MG tablet Take 20 mg by mouth daily.   Yes Historical Provider, MD  budesonide (PULMICORT) 180 MCG/ACT inhaler Inhale 1 puff into the lungs 2 (two) times daily.   Yes Historical Provider, MD    Cholecalciferol (HM VITAMIN D3) 4000 UNITS CAPS Take 4,000 Units by mouth daily.   Yes Historical Provider, MD  citalopram (CELEXA) 40 MG tablet Take 40 mg by mouth daily.   Yes Historical Provider, MD  Coenzyme Q10 (COQ10) 100 MG CAPS Take 1 capsule by mouth daily.   Yes Historical Provider, MD  irbesartan-hydrochlorothiazide (AVALIDE) 300-12.5 MG per tablet Take 1 tablet by mouth daily.    Yes Historical Provider, MD  Omega-3 Fatty Acids (FISH OIL) 1200 MG CAPS Take 1 capsule by mouth daily.   Yes Historical Provider, MD  pregabalin (LYRICA) 50 MG capsule Take 150 mg by mouth 3 (three) times daily.    Yes Historical Provider, MD  traMADol (ULTRAM) 50 MG tablet TK 1 TO 2 TS PO Q 8 H PRN P FOR 10 DAYS 12/16/15  Yes Historical Provider, MD      Allergies  Allergen Reactions  . Oxycontin [Oxycodone] Hives  . Pneumococcal Vaccines Swelling  . Hydrocodone Nausea And Vomiting    ROS:  Out of a complete 14 system review of symptoms, the patient complains only of the following symptoms, and all other reviewed systems are negative.  Heart murmur Ringing in the ears Moles Anxiety  Blood pressure 122/72, pulse 70, height 5\' 1"  (1.549 m), weight 172 lb (78 kg).  Physical Exam  General: The patient is alert and cooperative at the time of the examination. The patient is moderately obese.  Eyes: Pupils are equal, round, and reactive to light. Discs are flat bilaterally.  Neck: The neck is supple, no carotid bruits are noted.  Respiratory: The respiratory examination is clear.  Cardiovascular: The cardiovascular examination reveals a regular rate and rhythm, no obvious murmurs or rubs are noted.  Skin: Extremities are without significant edema.  Neurologic Exam  Mental status: The patient is alert and oriented x 3 at the time of the examination. The patient has apparent normal recent and remote memory, with an apparently normal attention span and concentration ability.  Cranial nerves:  Facial symmetry is present. There is good sensation of the face to pinprick and soft touch bilaterally. The strength of the facial muscles and the muscles to head turning and shoulder shrug are normal bilaterally. Speech is well enunciated, no aphasia or dysarthria is noted. Extraocular movements are full. Visual fields are full. The tongue is midline, and the patient has symmetric elevation of the soft palate. No obvious hearing deficits are noted.  Motor: The motor testing reveals 5 over 5 strength of all 4 extremities, with exception of 4/5 strength of the right triceps, finger extension on the right hand and extension of the right thumb. Good symmetric motor tone is noted throughout.  Sensory: Sensory testing is intact to pinprick, soft touch, vibration sensation, and position sense  on all 4 extremities. No evidence of extinction is noted.  Coordination: Cerebellar testing reveals good finger-nose-finger and heel-to-shin bilaterally.  Gait and station: Gait is normal. Tandem gait is normal. Romberg is negative. No drift is seen.  Reflexes: Deep tendon reflexes are symmetric and normal bilaterally, with exception of depression to absence of the right knee jerk, slight depression of the right triceps reflex. Toes are downgoing bilaterally.   CT cervical with myelogram 01/09/16:  IMPRESSION: 1. Multilevel cervical disc degeneration, worst at C5-6 where there is mild spinal stenosis and mild-to-moderate bilateral neural foraminal stenosis. 2. Mild neural foraminal stenosis elsewhere in the cervical spine as above. 3. Multilevel lumbar disc degeneration, greatest at L4-5 where there is moderate bilateral lateral recess stenosis potentially affecting the L5 nerve roots. Mild spinal and mild bilateral foraminal stenosis. 4. Right extraforaminal disc protrusion at L2-3 which could affect the right L2 nerve. 5. Mild right neural foraminal stenosis at L5-S1. 6. Partially visualized cystic  structure in the pelvis. If the patient has had a prior hysterectomy, this may represent a posteriorly located bladder. If not, this may represent a cystic adnexal lesion, and pelvic ultrasound could be performed to further evaluate.   Assessment/Plan:  1. Right arm pain and weakness, possible Parsonage-Turner syndrome  The patient has no definite evidence of nerve root impingement by CT myelogram of the cervical spine. The patient had onset of right arm pain and brief transient pain of the right leg. These episodes may be associated with a plexopathy involving the arm and leg, much more severe with the arm. I discussed getting EMG and nerve conduction study evaluation, the patient does not wish to pursue this study. We will check blood work today, I will follow-up in 2 months. If the weakness in the right arm progresses, MRI of the right brachial plexus will be performed. Otherwise, treatment is conservative with pain management.  Jill Alexanders MD 02/02/2016 12:15 PM  [facility] [address] [place], [place] [address]  Phone [phone] Fax [phone]

## 2016-02-02 NOTE — Patient Instructions (Addendum)
Neuropathic Pain Neuropathic pain is pain caused by damage to the nerves that are responsible for certain sensations in your body (sensory nerves). The pain can be caused by damage to:   The sensory nerves that send signals to your spinal cord and brain (peripheral nervous system).  The sensory nerves in your brain or spinal cord (central nervous system). Neuropathic pain can make you more sensitive to pain. What would be a minor sensation for most people may feel very painful if you have neuropathic pain. This is usually a long-term condition that can be difficult to treat. The type of pain can differ from person to person. It may start suddenly (acute), or it may develop slowly and last for a long time (chronic). Neuropathic pain may come and go as damaged nerves heal or may stay at the same level for years. It often causes emotional distress, loss of sleep, and a lower quality of life. CAUSES  The most common cause of damage to a sensory nerve is diabetes. Many other diseases and conditions can also cause neuropathic pain. Causes of neuropathic pain can be classified as:  Toxic. Many drugs and chemicals can cause toxic damage. The most common cause of toxic neuropathic pain is damage from drug treatment for cancer (chemotherapy).  Metabolic. This type of pain can happen when a disease causes imbalances that damage nerves. Diabetes is the most common of these diseases. Vitamin B deficiency caused by long-term alcohol abuse is another common cause.  Traumatic. Any injury that cuts, crushes, or stretches a nerve can cause damage and pain. A common example is feeling pain after losing an arm or leg (phantom limb pain).  Compression-related. If a sensory nerve gets trapped or compressed for a long period of time, the blood supply to the nerve can be cut off.  Vascular. Many blood vessel diseases can cause neuropathic pain by decreasing blood supply and oxygen to nerves.  Autoimmune. This type of  pain results from diseases in which the body's defense system mistakenly attacks sensory nerves. Examples of autoimmune diseases that can cause neuropathic pain include lupus and multiple sclerosis.  Infectious. Many types of viral infections can damage sensory nerves and cause pain. Shingles infection is a common cause of this type of pain.  Inherited. Neuropathic pain can be a symptom of many diseases that are passed down through families (genetic). SIGNS AND SYMPTOMS  The main symptom is pain. Neuropathic pain is often described as:  Burning.  Shock-like.  Stinging.  Hot or cold.  Itching. DIAGNOSIS  No single test can diagnose neuropathic pain. Your health care provider will do a physical exam and ask you about your pain. You may use a pain scale to describe how bad your pain is. You may also have tests to see if you have a high sensitivity to pain and to help find the cause and location of any sensory nerve damage. These tests may include:  Imaging studies, such as:  X-rays.  CT scan.  MRI.  Nerve conduction studies to test how well nerve signals travel through your sensory nerves (electrodiagnostic testing).  Stimulating your sensory nerves through electrodes on your skin and measuring the response in your spinal cord and brain (somatosensory evoked potentials). TREATMENT  Treatment for neuropathic pain may change over time. You may need to try different treatment options or a combination of treatments. Some options include:  Over-the-counter pain relievers.  Prescription medicines. Some medicines used to treat other conditions may also help neuropathic pain. These   include medicines to:  Control seizures (anticonvulsants).  Relieve depression (antidepressants).  Prescription-strength pain relievers (narcotics). These are usually used when other pain relievers do not help.  Transcutaneous nerve stimulation (TENS). This uses electrical currents to block painful nerve  signals. The treatment is painless.  Topical and local anesthetics. These are medicines that numb the nerves. They can be injected as a nerve block or applied to the skin.  Alternative treatments, such as:  Acupuncture.  Meditation.  Massage.  Physical therapy.  Pain management programs.  Counseling. HOME CARE INSTRUCTIONS  Learn as much as you can about your condition.  Take medicines only as directed by your health care provider.  Work closely with all your health care providers to find what works best for you.  Have a good support system at home.  Consider joining a chronic pain support group. SEEK MEDICAL CARE IF:  Your pain treatments are not helping.  You are having side effects from your medicines.  You are struggling with fatigue, mood changes, depression, or anxiety.   This information is not intended to replace advice given to you by your health care provider. Make sure you discuss any questions you have with your health care provider.   Document Released: 03/22/2004 Document Revised: 07/16/2014 Document Reviewed: 12/03/2013 Elsevier Interactive Patient Education 2016 Elsevier Inc.  

## 2016-02-03 LAB — PAN-ANCA: P-ANCA: <1:20 {titer}

## 2016-02-03 LAB — B. BURGDORFI ANTIBODIES: Lyme IgG/IgM Ab: <0.91 {ISR} (ref 0.00–0.90)

## 2016-02-03 LAB — HEMOGLOBIN A1C
Est. average glucose Bld gHb Est-mCnc: 120 mg/dL
Hgb A1c MFr Bld: 5.8 % — ABNORMAL HIGH (ref 4.8–5.6)

## 2016-02-03 LAB — ANA W/REFLEX: Anti Nuclear Antibody(ANA): NEGATIVE

## 2016-02-03 LAB — SEDIMENTATION RATE: Sed Rate: 7 mm/hr (ref 0–40)

## 2016-02-03 LAB — ANGIOTENSIN CONVERTING ENZYME: ANGIO CONVERT ENZYME: 21 U/L (ref 14–82)

## 2016-02-07 ENCOUNTER — Telehealth: Payer: Self-pay | Admitting: Cardiology

## 2016-02-07 NOTE — Telephone Encounter (Signed)
New message ° ° ° ° °Pt returning nurse call. Please call.  °

## 2016-02-07 NOTE — Telephone Encounter (Signed)
-----   Message from Peter M Martinique, MD sent at 02/05/2016 10:48 AM EDT ----- Echo shows good results of prior surgery for Hypertrophic cardiomyopathy and MR. Only mild LVH with normal LV function. No outflow gradient noted. No VSD. MV repair is satisfactory. Overall an excellent result. There is moderate pulmonary insufficiency but I would not be concerned about this.  Peter Martinique MD, [facility]

## 2016-02-07 NOTE — Telephone Encounter (Signed)
Advised patient of results.  

## 2016-04-05 ENCOUNTER — Encounter: Payer: Self-pay | Admitting: Neurology

## 2016-04-05 ENCOUNTER — Ambulatory Visit (INDEPENDENT_AMBULATORY_CARE_PROVIDER_SITE_OTHER): Payer: Medicare Other | Admitting: Neurology

## 2016-04-05 VITALS — BP 124/78 | HR 70 | Ht 61.0 in | Wt 174.5 lb

## 2016-04-05 DIAGNOSIS — G545 Neuralgic amyotrophy: Secondary | ICD-10-CM

## 2016-04-05 NOTE — Progress Notes (Signed)
Reason for visit: Parsonage-Turner syndrome  STEPHAIE [name] is an 65 y.o. female  History of present illness:  Janet [name] is a 65 year old right-handed white female with a history of pain in weakness involving the right arm primarily. The patient has been felt to have a Parsonage-Turner syndrome affecting the right brachial plexus. The patient has had good improvement of the discomfort, she is having no pain at this time. She believes that the strength of the right arm is returning, she does not note any weakness at all at this point. She has had resolution of the discomfort in the right leg as well. She returns to this office for an evaluation.  Past Medical History:  Diagnosis Date  . Abrasion of elbow, right 12/29/2015  . Anxiety   . Asthma    daily and prn inhalers  . High cholesterol    takes lipitor  . HOCM (hypertrophic obstructive cardiomyopathy) ([place])   . Hypertension    states under control with med., has been on med. x "years"  . Left bundle branch block (LBBB)   . Leg skin lesion, left    mole removed 12/27/2015  . Melanoma ([place])   . Nipple lesion 12/2015   right nipple mass  . Parsonage-Turner syndrome 02/02/2016   right  . Pinched nerve in neck 12/2015  . PONV (postoperative nausea and vomiting)     Past Surgical History:  Procedure Laterality Date  . APPENDECTOMY     age 34  . CARDIAC SURGERY  03/09/2014   septal myectomy  . HYSTEROSCOPY W/D&C  07/21/2004   submucosal myomectomy  . INFERIOR OBLIQUE MYECTOMY  2015  . MASS EXCISION Right 01/04/2016   Procedure: EXCISION OF 1CM RIGHT NIPPLE MASS;  Surgeon: Erroll Luna, MD;  Location: [place];  Service: General;  Laterality: Right;  . MITRAL VALVE REPAIR  03/09/2014  . NAILBED REPAIR Right 10/14/2012   Procedure: BIOPSY NAIL MATRIX RIGHT THUMB;  Surgeon: Wynonia Sours, MD;  Location: [place];  Service: Orthopedics;  Laterality: Right;  . SALPINGOOPHORECTOMY  2017  . TEE WITHOUT  CARDIOVERSION N/A 11/10/2013   Procedure: TRANSESOPHAGEAL ECHOCARDIOGRAM (TEE);  Surgeon: Dorothy Spark, MD;  Location: [facility] ENDOSCOPY;  Service: Cardiovascular;  Laterality: N/A;    Family History  Problem Relation Age of Onset  . Arrhythmia Father     A fib  . Hypertension Father   . Arrhythmia Brother     A fib  . Heart disease Brother     SBE, MV repair  . Breast cancer Mother   . Hypertension Mother     Social history:  reports that she quit smoking about 29 years ago. She has never used smokeless tobacco. She reports that she does not drink alcohol or use drugs.    Allergies  Allergen Reactions  . Oxycontin [Oxycodone] Hives  . Pneumococcal Vaccines Swelling  . Hydrocodone Nausea And Vomiting    Medications:  Prior to Admission medications   Medication Sig Start Date End Date Taking? Authorizing Provider  albuterol (PROVENTIL HFA;VENTOLIN HFA) 108 (90 BASE) MCG/ACT inhaler Inhale 2 puffs into the lungs every 6 (six) hours as needed for wheezing.   Yes Historical Provider, MD  aspirin 81 MG tablet Take 81 mg by mouth daily.   Yes Historical Provider, MD  atorvastatin (LIPITOR) 20 MG tablet Take 20 mg by mouth daily.   Yes Historical Provider, MD  budesonide (PULMICORT) 180 MCG/ACT inhaler Inhale 1 puff into the lungs  2 (two) times daily.   Yes Historical Provider, MD  Cholecalciferol (HM VITAMIN D3) 4000 UNITS CAPS Take 4,000 Units by mouth daily.   Yes Historical Provider, MD  citalopram (CELEXA) 40 MG tablet Take 40 mg by mouth daily.   Yes Historical Provider, MD  Coenzyme Q10 (COQ10) 100 MG CAPS Take 1 capsule by mouth daily.   Yes Historical Provider, MD  irbesartan-hydrochlorothiazide (AVALIDE) 300-12.5 MG per tablet Take 1 tablet by mouth daily.    Yes Historical Provider, MD  Omega-3 Fatty Acids (FISH OIL) 1200 MG CAPS Take 1 capsule by mouth daily.   Yes Historical Provider, MD  pregabalin (LYRICA) 50 MG capsule Take 50 mg by mouth 2 (two) times daily.    Yes  Historical Provider, MD    ROS:  Out of a complete 14 system review of symptoms, the patient complains only of the following symptoms, and all other reviewed systems are negative.  Right arm weakness  Blood pressure 124/78, pulse 70, height 5\' 1"  (1.549 m), weight 174 lb 8 oz (79.2 kg).  Physical Exam  General: The patient is alert and cooperative at the time of the examination.  Skin: No significant peripheral edema is noted.   Neurologic Exam  Mental status: The patient is alert and oriented x 3 at the time of the examination. The patient has apparent normal recent and remote memory, with an apparently normal attention span and concentration ability.   Cranial nerves: Facial symmetry is present. Speech is normal, no aphasia or dysarthria is noted. Extraocular movements are full. Visual fields are full.  Motor: The patient has good strength in all 4 extremities, with exception of slight weakness with finger extension, wrist extension, and 4/5 strength with the right triceps muscle.  Sensory examination: Soft touch sensation is symmetric on the face, arms, and legs.  Coordination: The patient has good finger-nose-finger and heel-to-shin bilaterally.  Gait and station: The patient has a normal gait. Tandem gait is normal. Romberg is negative. No drift is seen.  Reflexes: Deep tendon reflexes are symmetric.   Assessment/Plan:  1. Right Parsonage-Turner syndrome, resolving  The patient continues to have some weakness in the radial nerve innervated muscles of the right hand. The patient is gradually improving with her strength, the pain has gone away, I recommended tapering off of the Lyrica by 50 mg a week. If the weakness or pain returns, the patient is to contact our office, otherwise she will follow-up if needed.  Jill Alexanders MD 04/05/2016 1:43 PM  [facility] [address] [place],  [address]  Phone [phone] Fax  [phone]

## 2016-07-01 NOTE — Progress Notes (Signed)
[name] Date of Birth: [birth year] Medical Record [id number]  History of Present Illness: Ruey is seen for follow HOCM. She is  s/p septal myectomy and MV repair at [facility] clinic on March 09, 2014. This was performed for HOCM with severe LV outflow gradient as well as MV prolapse with severe posterior MV prolapse and severe MR. She had extended septal myectomy with triangular resection of the posterior MV leaflet with a C shaped annuloplasty ring. She did very well in the post op period. No Afib. She did develop a post op LBBB.  Follow up Echo in December 2015 showed no LVOT gradient and only trivial MR. Repeat Echo in July 2017 was stable. On follow up today she is doing very well. She denies any palpitations, chest pain, SOB.   She is exercising regularly. Very pleased with her health currently.   Current Outpatient Prescriptions on File Prior to Visit  Medication Sig Dispense Refill  . albuterol (PROVENTIL HFA;VENTOLIN HFA) 108 (90 BASE) MCG/ACT inhaler Inhale 2 puffs into the lungs every 6 (six) hours as needed for wheezing.    Marland Kitchen aspirin 81 MG tablet Take 81 mg by mouth daily.    Marland Kitchen atorvastatin (LIPITOR) 20 MG tablet Take 20 mg by mouth daily.    . budesonide (PULMICORT) 180 MCG/ACT inhaler Inhale 1 puff into the lungs 2 (two) times daily.    . Cholecalciferol (HM VITAMIN D3) 4000 UNITS CAPS Take 4,000 Units by mouth daily.    . citalopram (CELEXA) 40 MG tablet Take 40 mg by mouth daily.    . irbesartan-hydrochlorothiazide (AVALIDE) 300-12.5 MG per tablet Take 1 tablet by mouth daily.     . Omega-3 Fatty Acids (FISH OIL) 1200 MG CAPS Take 1 capsule by mouth daily.     No current facility-administered medications on file prior to visit.     Allergies  Allergen Reactions  . Oxycontin [Oxycodone] Hives  . Pneumococcal Vaccines Swelling  . Hydrocodone Nausea And Vomiting    Past Medical History:  Diagnosis Date  . Abrasion of elbow, right 12/29/2015  . Anxiety   . Asthma     daily and prn inhalers  . High cholesterol    takes lipitor  . HOCM (hypertrophic obstructive cardiomyopathy) ([place])   . Hypertension    states under control with med., has been on med. x "years"  . Left bundle branch block (LBBB)   . Leg skin lesion, left    mole removed 12/27/2015  . Melanoma ([place])   . Nipple lesion 12/2015   right nipple mass  . Parsonage-Turner syndrome 02/02/2016   right  . Pinched nerve in neck 12/2015  . PONV (postoperative nausea and vomiting)     Past Surgical History:  Procedure Laterality Date  . APPENDECTOMY     65  . CARDIAC SURGERY  03/09/2014   septal myectomy  . HYSTEROSCOPY W/D&C  07/21/2004   submucosal myomectomy  . INFERIOR OBLIQUE MYECTOMY  2015  . MASS EXCISION Right 01/04/2016   Procedure: EXCISION OF 1CM RIGHT NIPPLE MASS;  Surgeon: Erroll Luna, MD;  Location: [place];  Service: General;  Laterality: Right;  . MITRAL VALVE REPAIR  03/09/2014  . NAILBED REPAIR Right 10/14/2012   Procedure: BIOPSY NAIL MATRIX RIGHT THUMB;  Surgeon: Wynonia Sours, MD;  Location: [place];  Service: Orthopedics;  Laterality: Right;  . SALPINGOOPHORECTOMY  2017  . TEE WITHOUT CARDIOVERSION N/A 11/10/2013   Procedure: TRANSESOPHAGEAL ECHOCARDIOGRAM (TEE);  Surgeon:  Dorothy Spark, MD;  Location: [facility] ENDOSCOPY;  Service: Cardiovascular;  Laterality: N/A;    History  Smoking Status  . Former Smoker  . Quit date: 07/08/1986  Smokeless Tobacco  . Never Used    History  Alcohol Use No    Family History  Problem Relation Age of Onset  . Arrhythmia Father     A fib  . Hypertension Father   . Arrhythmia Brother     A fib  . Heart disease Brother     SBE, MV repair  . Breast cancer Mother   . Hypertension Mother     Review of Systems: As noted in HPI.  All other systems were reviewed and are negative.  Physical Exam: BP 140/79 (BP Location: Right Arm)   Pulse 89   Ht 5\' 1"  (1.549 m)   Wt 175 lb 9.6 oz (79.7  kg)   BMI 33.18 kg/m   Repeat BP 134/76. Pulse 80. She is a pleasant overweight WF in NAD.  HEENT: [place]/AT, PERRL, EOMI, sclera are clear. Oropharynx is clear. Neck: No JVD, adenopathy, thyromegaly, or bruits. Carotid upstrokes are normal.  Lungs: clear CV: RRR, normal S1-S2. There is a grade 2/6 systolic murmur loudest at LSB. No gallop or rub. Abd: soft, NT. BS +. No masses or HSM Ext: no cyanosis or edema. Pulses 2+ Skin: warm and dry. No rashes. Neuro: alert and oriented x 3. CN II-XII intact. Nonfocal.  LABORATORY DATA:  Lab Results  Component Value Date   HGB 12.8 10/14/2012   GLUCOSE 101 (H) 12/30/2015   NA 139 12/30/2015   K 4.4 12/30/2015   CL 104 12/30/2015   CREATININE 0.64 12/30/2015   BUN 15 12/30/2015   CO2 29 12/30/2015   HGBA1C 5.8 (H) 02/02/2016   Labs dated 11/03/15: cholesterol 159, triglycerides 155, LDL 78, HDL 50.   Echo 01/31/16: Study Conclusions  - Left ventricle: The cavity size was normal. There was mild   concentric hypertrophy. Systolic function was normal. The   estimated ejection fraction was in the range of 60% to 65%. No   VSD. Wall motion was normal; there were no regional wall motion   abnormalities. Doppler parameters are consistent with abnormal   left ventricular relaxation (grade 1 diastolic dysfunction). - Mitral valve: Post mitral valve repair post myomectomy. Mobility   was restricted. - Left atrium: The atrium was mildly dilated. - Pulmonic valve: There was moderate regurgitation. - Pulmonary arteries: Systolic pressure was mildly increased. PA   peak pressure: 33 mm Hg (S).   Assessment / Plan: 1. MV prolapse with severe mitral insufficiency.Now s/p posterior MV leaflet repair. Trivial residual insufficiency by prior Echo.   2. HOCM s/p septal myectomy. No residual gradient.  3. PVCs- resolved post surgery.  4.  HTN - controlled on Avalide.  5. LBBB. Persistent by Ecg June 2017.  Continue current therapy. Will plan  follow up in 6 months.

## 2016-07-05 ENCOUNTER — Ambulatory Visit (INDEPENDENT_AMBULATORY_CARE_PROVIDER_SITE_OTHER): Payer: Medicare Other | Admitting: Cardiology

## 2016-07-05 ENCOUNTER — Encounter: Payer: Self-pay | Admitting: Cardiology

## 2016-07-05 VITALS — BP 140/79 | HR 89 | Ht 61.0 in | Wt 175.6 lb

## 2016-07-05 DIAGNOSIS — I341 Nonrheumatic mitral (valve) prolapse: Secondary | ICD-10-CM | POA: Diagnosis not present

## 2016-07-05 DIAGNOSIS — I447 Left bundle-branch block, unspecified: Secondary | ICD-10-CM

## 2016-07-05 DIAGNOSIS — Z9889 Other specified postprocedural states: Secondary | ICD-10-CM | POA: Diagnosis not present

## 2016-07-05 DIAGNOSIS — I10 Essential (primary) hypertension: Secondary | ICD-10-CM | POA: Diagnosis not present

## 2016-07-05 DIAGNOSIS — I421 Obstructive hypertrophic cardiomyopathy: Secondary | ICD-10-CM | POA: Diagnosis not present

## 2016-07-05 NOTE — Patient Instructions (Signed)
Continue your current therapy  I will see you in 6 months.   

## 2016-11-26 ENCOUNTER — Ambulatory Visit: Payer: Self-pay | Admitting: Surgery

## 2016-11-27 ENCOUNTER — Other Ambulatory Visit: Payer: Self-pay | Admitting: Surgery

## 2016-11-27 DIAGNOSIS — N6452 Nipple discharge: Secondary | ICD-10-CM

## 2016-11-29 ENCOUNTER — Other Ambulatory Visit: Payer: Self-pay | Admitting: Surgery

## 2016-11-29 DIAGNOSIS — N6452 Nipple discharge: Secondary | ICD-10-CM

## 2016-12-06 ENCOUNTER — Ambulatory Visit
Admission: RE | Admit: 2016-12-06 | Discharge: 2016-12-06 | Disposition: A | Discharge status: Home / Self Care | Payer: Medicare Other | Source: Ambulatory Visit | Attending: Surgery | Admitting: Surgery

## 2016-12-06 DIAGNOSIS — N6452 Nipple discharge: Secondary | ICD-10-CM

## 2016-12-06 HISTORY — DX: Nipple discharge: N64.52

## 2016-12-07 ENCOUNTER — Other Ambulatory Visit: Payer: Self-pay | Admitting: Surgery

## 2016-12-07 DIAGNOSIS — N6452 Nipple discharge: Secondary | ICD-10-CM

## 2016-12-21 ENCOUNTER — Ambulatory Visit
Admission: RE | Admit: 2016-12-21 | Discharge: 2016-12-21 | Disposition: A | Discharge status: Home / Self Care | Payer: Medicare Other | Source: Ambulatory Visit | Attending: Surgery | Admitting: Surgery

## 2016-12-21 DIAGNOSIS — N6452 Nipple discharge: Secondary | ICD-10-CM

## 2016-12-21 MED ORDER — GADOBENATE DIMEGLUMINE 529 MG/ML IV SOLN: 17.0000 mL | Freq: Once | INTRAVENOUS | Status: DC | PRN

## 2016-12-25 NOTE — Progress Notes (Signed)
Janet [name] Date of Birth: 10-Feb-1951 Medical Record #[id number]  History of Present Illness: Janet [name] is seen for follow HOCM. She is  s/p septal myectomy and MV repair at [facility] clinic on March 09, 2014. This was performed for HOCM with severe LV outflow gradient as well as MV prolapse with severe posterior MV prolapse and severe MR. She had extended septal myectomy with triangular resection of the posterior MV leaflet with a C shaped annuloplasty ring. She did very well in the post op period. No Afib. She did develop a post op LBBB.  Follow up Echo in December 2015 showed no LVOT gradient and only trivial MR. Repeat Echo in July 2017 was stable. On follow up today she is doing very well. She denies any palpitations, chest pain, SOB.   She is exercising regularly. She is actually training for a 5K race in July. She does have some asthma symptoms controlled with inhaler.    Current Outpatient Prescriptions on File Prior to Visit  Medication Sig Dispense Refill  . albuterol (PROVENTIL HFA;VENTOLIN HFA) 108 (90 BASE) MCG/ACT inhaler Inhale 2 puffs into the lungs every 6 (six) hours as needed for wheezing.    Marland Kitchen aspirin 81 MG tablet Take 81 mg by mouth daily.    Marland Kitchen atorvastatin (LIPITOR) 20 MG tablet Take 20 mg by mouth daily.    . Cholecalciferol (HM VITAMIN D3) 4000 UNITS CAPS Take 4,000 Units by mouth daily.    . citalopram (CELEXA) 40 MG tablet Take 40 mg by mouth daily.    . clobetasol ointment (TEMOVATE) 3.78 % Apply 1 application topically 2 (two) times daily.    . Coenzyme Q10 (CO Q-10) 300 MG CAPS Take 300 mg by mouth daily.    . irbesartan-hydrochlorothiazide (AVALIDE) 300-12.5 MG per tablet Take 1 tablet by mouth daily.     . Omega-3 Fatty Acids (FISH OIL) 1200 MG CAPS Take 1 capsule by mouth daily.     No current facility-administered medications on file prior to visit.     Allergies  Allergen Reactions  . Oxycontin [Oxycodone] Hives  . Pneumococcal Vaccines Swelling  .  Hydrocodone Nausea And Vomiting    Past Medical History:  Diagnosis Date  . Abrasion of elbow, right 12/29/2015  . Anxiety   . Asthma    daily and prn inhalers  . Breast discharge    right bloody x 2 months  . High cholesterol    takes lipitor  . HOCM (hypertrophic obstructive cardiomyopathy) ([place])   . Hypertension    states under control with med., has been on med. x "years"  . Left bundle branch block (LBBB)   . Leg skin lesion, left    mole removed 12/27/2015  . Melanoma ([place])   . Nipple lesion 12/2015   right nipple mass  . Parsonage-Turner syndrome 02/02/2016   right  . Pinched nerve in neck 12/2015  . PONV (postoperative nausea and vomiting)     Past Surgical History:  Procedure Laterality Date  . APPENDECTOMY     age 61  . BREAST EXCISIONAL BIOPSY    . CARDIAC SURGERY  03/09/2014   septal myectomy  . HYSTEROSCOPY W/D&C  07/21/2004   submucosal myomectomy  . INFERIOR OBLIQUE MYECTOMY  2015  . MASS EXCISION Right 01/04/2016   Procedure: EXCISION OF 1CM RIGHT NIPPLE MASS;  Surgeon: Erroll Luna, MD;  Location: [place];  Service: General;  Laterality: Right;  . MITRAL VALVE REPAIR  03/09/2014  . NAILBED REPAIR  Right 10/14/2012   Procedure: BIOPSY NAIL MATRIX RIGHT THUMB;  Surgeon: Wynonia Sours, MD;  Location: [place];  Service: Orthopedics;  Laterality: Right;  . SALPINGOOPHORECTOMY  2017  . TEE WITHOUT CARDIOVERSION N/A 11/10/2013   Procedure: TRANSESOPHAGEAL ECHOCARDIOGRAM (TEE);  Surgeon: Dorothy Spark, MD;  Location[facility] ENDOSCOPY;  Service: Cardiovascular;  Laterality: N/A;    History  Smoking Status  . Former Smoker  . Quit date: 07/08/1986  Smokeless Tobacco  . Never Used    History  Alcohol Use No    Family History  Problem Relation Age of Onset  . Arrhythmia Father        A fib  . Hypertension Father   . Arrhythmia Brother        A fib  . Heart disease Brother        SBE, MV repair  . Breast cancer Maternal  Grandmother   . Breast cancer Mother   . Hypertension Mother     Review of Systems: As noted in HPI.  All other systems were reviewed and are negative.  Physical Exam: BP 126/78   Pulse 74   Ht 5\' 1"  (1.549 m)   Wt 175 lb (79.4 kg)   BMI 33.07 kg/m   Repeat BP 134/76. Pulse 80. She is a pleasant overweight WF in NAD.  HEENT: [place]/AT, PERRL, EOMI, sclera are clear. Oropharynx is clear. Neck: No JVD, adenopathy, thyromegaly, or bruits. Carotid upstrokes are normal.  Lungs: clear CV: RRR, normal S1-S2. There is a grade 2/6 systolic murmur loudest at LSB. No gallop or rub. Abd: soft, NT. BS +. No masses or HSM Ext: no cyanosis or edema. Pulses 2+ Skin: warm and dry. No rashes. Neuro: alert and oriented x 3. CN II-XII intact. Nonfocal.  LABORATORY DATA:  Lab Results  Component Value Date   HGB 12.8 10/14/2012   GLUCOSE 101 (H) 12/30/2015   NA 139 12/30/2015   K 4.4 12/30/2015   CL 104 12/30/2015   CREATININE 0.64 12/30/2015   BUN 15 12/30/2015   CO2 29 12/30/2015   HGBA1C 5.8 (H) 02/02/2016   Labs dated 11/03/15: cholesterol 159, triglycerides 155, LDL 78, HDL 50.   Ecg today shows NSR with first degree AV block. LAD, LBBB.  I have personally reviewed and interpreted this study.   Echo 01/31/16: Study Conclusions  - Left ventricle: The cavity size was normal. There was mild   concentric hypertrophy. Systolic function was normal. The   estimated ejection fraction was in the range of 60% to 65%. No   VSD. Wall motion was normal; there were no regional wall motion   abnormalities. Doppler parameters are consistent with abnormal   left ventricular relaxation (grade 1 diastolic dysfunction). - Mitral valve: Post mitral valve repair post myomectomy. Mobility   was restricted. - Left atrium: The atrium was mildly dilated. - Pulmonic valve: There was moderate regurgitation. - Pulmonary arteries: Systolic pressure was mildly increased. PA   peak pressure: 33 mm Hg  (S).   Assessment / Plan: 1. MV prolapse with severe mitral insufficiency.Now s/p posterior MV leaflet repair. Trivial residual insufficiency by prior Echo. No murmur on exam today.   2. HOCM s/p septal myectomy. No residual gradient. Asymptomatic   3. PVCs- resolved post surgery.   4.  HTN - controlled on Avalide.  5. LBBB. Persistent by Ecg June 2017.  Continue current therapy. Will plan follow up in 6 months.

## 2016-12-26 ENCOUNTER — Ambulatory Visit (INDEPENDENT_AMBULATORY_CARE_PROVIDER_SITE_OTHER): Payer: Medicare Other | Admitting: Cardiology

## 2016-12-26 ENCOUNTER — Encounter: Payer: Self-pay | Admitting: Cardiology

## 2016-12-26 VITALS — BP 126/78 | HR 74 | Ht 61.0 in | Wt 175.0 lb

## 2016-12-26 DIAGNOSIS — I10 Essential (primary) hypertension: Secondary | ICD-10-CM

## 2016-12-26 DIAGNOSIS — I341 Nonrheumatic mitral (valve) prolapse: Secondary | ICD-10-CM | POA: Diagnosis not present

## 2016-12-26 DIAGNOSIS — I421 Obstructive hypertrophic cardiomyopathy: Secondary | ICD-10-CM

## 2016-12-26 DIAGNOSIS — I447 Left bundle-branch block, unspecified: Secondary | ICD-10-CM | POA: Diagnosis not present

## 2016-12-26 DIAGNOSIS — Z9889 Other specified postprocedural states: Secondary | ICD-10-CM

## 2016-12-26 NOTE — Patient Instructions (Signed)
Continue your current therapy  I will see you in 6 months.   

## 2017-01-10 ENCOUNTER — Ambulatory Visit: Payer: Medicare Other | Admitting: Cardiology

## 2017-01-22 ENCOUNTER — Ambulatory Visit: Payer: Self-pay | Admitting: Surgery

## 2017-02-11 ENCOUNTER — Encounter (HOSPITAL_BASED_OUTPATIENT_CLINIC_OR_DEPARTMENT_OTHER): Payer: Self-pay | Admitting: *Deleted

## 2017-02-12 ENCOUNTER — Encounter (HOSPITAL_BASED_OUTPATIENT_CLINIC_OR_DEPARTMENT_OTHER)
Admission: RE | Admit: 2017-02-12 | Discharge: 2017-02-12 | Disposition: A | Discharge status: Home / Self Care | Payer: Medicare Other | Source: Ambulatory Visit | Attending: Surgery | Admitting: Surgery

## 2017-02-12 DIAGNOSIS — Z79899 Other long term (current) drug therapy: Secondary | ICD-10-CM | POA: Insufficient documentation

## 2017-02-12 DIAGNOSIS — I10 Essential (primary) hypertension: Secondary | ICD-10-CM | POA: Insufficient documentation

## 2017-02-12 LAB — BASIC METABOLIC PANEL
Anion gap: 7 (ref 5–15)
BUN: 11 mg/dL (ref 6–20)
CHLORIDE: 102 mmol/L (ref 101–111)
CO2: 31 mmol/L (ref 22–32)
Calcium: 9 mg/dL (ref 8.9–10.3)
Creatinine, Ser: 0.63 mg/dL (ref 0.44–1.00)
GLUCOSE: 112 mg/dL — AB (ref 65–99)
POTASSIUM: 3.8 mmol/L (ref 3.5–5.1)
SODIUM: 140 mmol/L (ref 135–145)

## 2017-02-12 NOTE — Progress Notes (Signed)
Ensure pre surgery drink given with instructions to complete by 0730 dos, pt verbalized understanding. 

## 2017-02-19 ENCOUNTER — Ambulatory Visit (HOSPITAL_BASED_OUTPATIENT_CLINIC_OR_DEPARTMENT_OTHER): Payer: Medicare Other | Admitting: Certified Registered Nurse Anesthetist

## 2017-02-19 ENCOUNTER — Encounter (HOSPITAL_BASED_OUTPATIENT_CLINIC_OR_DEPARTMENT_OTHER): Payer: Self-pay | Admitting: Certified Registered Nurse Anesthetist

## 2017-02-19 ENCOUNTER — Ambulatory Visit (HOSPITAL_BASED_OUTPATIENT_CLINIC_OR_DEPARTMENT_OTHER)
Admission: RE | Admit: 2017-02-19 | Discharge: 2017-02-19 | Disposition: A | Discharge status: Home / Self Care | Payer: Medicare Other | Source: Ambulatory Visit | Attending: Surgery | Admitting: Surgery

## 2017-02-19 ENCOUNTER — Encounter (HOSPITAL_BASED_OUTPATIENT_CLINIC_OR_DEPARTMENT_OTHER)
Admission: RE | Disposition: A | Discharge status: Home / Self Care | Payer: Self-pay | Source: Ambulatory Visit | Attending: Surgery

## 2017-02-19 DIAGNOSIS — N6001 Solitary cyst of right breast: Secondary | ICD-10-CM | POA: Insufficient documentation

## 2017-02-19 DIAGNOSIS — Z885 Allergy status to narcotic agent status: Secondary | ICD-10-CM | POA: Insufficient documentation

## 2017-02-19 DIAGNOSIS — F419 Anxiety disorder, unspecified: Secondary | ICD-10-CM | POA: Diagnosis not present

## 2017-02-19 DIAGNOSIS — Z7982 Long term (current) use of aspirin: Secondary | ICD-10-CM | POA: Diagnosis not present

## 2017-02-19 DIAGNOSIS — N6011 Diffuse cystic mastopathy of right breast: Secondary | ICD-10-CM | POA: Insufficient documentation

## 2017-02-19 DIAGNOSIS — G545 Neuralgic amyotrophy: Secondary | ICD-10-CM | POA: Insufficient documentation

## 2017-02-19 DIAGNOSIS — I421 Obstructive hypertrophic cardiomyopathy: Secondary | ICD-10-CM | POA: Diagnosis not present

## 2017-02-19 DIAGNOSIS — Z79899 Other long term (current) drug therapy: Secondary | ICD-10-CM | POA: Diagnosis not present

## 2017-02-19 DIAGNOSIS — I447 Left bundle-branch block, unspecified: Secondary | ICD-10-CM | POA: Diagnosis not present

## 2017-02-19 DIAGNOSIS — J45909 Unspecified asthma, uncomplicated: Secondary | ICD-10-CM | POA: Insufficient documentation

## 2017-02-19 DIAGNOSIS — I10 Essential (primary) hypertension: Secondary | ICD-10-CM | POA: Insufficient documentation

## 2017-02-19 DIAGNOSIS — N6452 Nipple discharge: Secondary | ICD-10-CM | POA: Diagnosis not present

## 2017-02-19 DIAGNOSIS — E7800 Pure hypercholesterolemia, unspecified: Secondary | ICD-10-CM | POA: Insufficient documentation

## 2017-02-19 DIAGNOSIS — N61 Mastitis without abscess: Secondary | ICD-10-CM | POA: Insufficient documentation

## 2017-02-19 DIAGNOSIS — Z87891 Personal history of nicotine dependence: Secondary | ICD-10-CM | POA: Insufficient documentation

## 2017-02-19 DIAGNOSIS — Z887 Allergy status to serum and vaccine status: Secondary | ICD-10-CM | POA: Insufficient documentation

## 2017-02-19 DIAGNOSIS — Z85820 Personal history of malignant melanoma of skin: Secondary | ICD-10-CM | POA: Insufficient documentation

## 2017-02-19 HISTORY — PX: BREAST DUCTAL SYSTEM EXCISION: SHX5242

## 2017-02-19 SURGERY — EXCISION DUCTAL SYSTEM BREAST
Anesthesia: General | Site: Breast | Laterality: Right

## 2017-02-19 MED ORDER — FENTANYL CITRATE (PF) 100 MCG/2ML IJ SOLN
INTRAMUSCULAR | Status: DC | PRN
  Administered 2017-02-19: 50 ug via INTRAVENOUS

## 2017-02-19 MED ORDER — CEFAZOLIN SODIUM-DEXTROSE 2-4 GM/100ML-% IV SOLN
2.0000 g | INTRAVENOUS | Status: AC
  Administered 2017-02-19: 2 g via INTRAVENOUS

## 2017-02-19 MED ORDER — LIDOCAINE HCL (CARDIAC) 20 MG/ML IV SOLN
INTRAVENOUS | Status: DC | PRN
  Administered 2017-02-19: 60 mg via INTRAVENOUS

## 2017-02-19 MED ORDER — ONDANSETRON HCL 4 MG/2ML IJ SOLN
INTRAMUSCULAR | Status: DC | PRN
  Administered 2017-02-19: 4 mg via INTRAVENOUS

## 2017-02-19 MED ORDER — FENTANYL CITRATE (PF) 100 MCG/2ML IJ SOLN
INTRAMUSCULAR | Status: AC
  Filled 2017-02-19: qty 2

## 2017-02-19 MED ORDER — CEFAZOLIN SODIUM-DEXTROSE 2-4 GM/100ML-% IV SOLN
INTRAVENOUS | Status: AC
  Filled 2017-02-19: qty 100

## 2017-02-19 MED ORDER — CHLORHEXIDINE GLUCONATE CLOTH 2 % EX PADS: 6.0000 | MEDICATED_PAD | Freq: Once | CUTANEOUS | Status: DC

## 2017-02-19 MED ORDER — PHENYLEPHRINE HCL 10 MG/ML IJ SOLN
INTRAMUSCULAR | Status: AC
Start: 2017-02-19 — End: ?
  Filled 2017-02-19: qty 1

## 2017-02-19 MED ORDER — PHENYLEPHRINE HCL 10 MG/ML IJ SOLN
INTRAMUSCULAR | Status: DC | PRN
  Administered 2017-02-19: 80 ug via INTRAVENOUS
  Administered 2017-02-19: 120 ug via INTRAVENOUS
  Administered 2017-02-19: 160 ug via INTRAVENOUS
  Administered 2017-02-19: 120 ug via INTRAVENOUS

## 2017-02-19 MED ORDER — LACTATED RINGERS IV SOLN
INTRAVENOUS | Status: DC | PRN
  Administered 2017-02-19: 11:00:00 via INTRAVENOUS

## 2017-02-19 MED ORDER — 0.9 % SODIUM CHLORIDE (POUR BTL) OPTIME
TOPICAL | Status: DC | PRN
  Administered 2017-02-19: 1000 mL

## 2017-02-19 MED ORDER — TRAMADOL HCL 50 MG PO TABS: 50.0000 mg | ORAL_TABLET | Freq: Four times a day (QID) | ORAL | 0 refills | Status: DC | PRN

## 2017-02-19 MED ORDER — DEXTROSE 5 % IV SOLN: 3.0000 g | INTRAVENOUS | Status: DC

## 2017-02-19 MED ORDER — DEXAMETHASONE SODIUM PHOSPHATE 4 MG/ML IJ SOLN
INTRAMUSCULAR | Status: DC | PRN
  Administered 2017-02-19: 8 mg via INTRAVENOUS

## 2017-02-19 MED ORDER — PROPOFOL 10 MG/ML IV BOLUS
INTRAVENOUS | Status: AC
  Filled 2017-02-19: qty 20

## 2017-02-19 MED ORDER — METOCLOPRAMIDE HCL 5 MG/ML IJ SOLN: 10.0000 mg | Freq: Once | INTRAMUSCULAR | Status: DC | PRN

## 2017-02-19 MED ORDER — PROPOFOL 10 MG/ML IV BOLUS
INTRAVENOUS | Status: DC | PRN
  Administered 2017-02-19 (×2): 100 mg via INTRAVENOUS

## 2017-02-19 MED ORDER — FENTANYL CITRATE (PF) 100 MCG/2ML IJ SOLN: 25.0000 ug | INTRAMUSCULAR | Status: DC | PRN

## 2017-02-19 MED ORDER — MEPERIDINE HCL 25 MG/ML IJ SOLN: 6.2500 mg | INTRAMUSCULAR | Status: DC | PRN

## 2017-02-19 MED ORDER — BUPIVACAINE-EPINEPHRINE 0.25% -1:200000 IJ SOLN
INTRAMUSCULAR | Status: DC | PRN
  Administered 2017-02-19: 30 mL

## 2017-02-19 SURGICAL SUPPLY — 28 items
BLADE SURG 15 STRL LF DISP TIS (BLADE) ×1 IMPLANT
BLADE SURG 15 STRL SS (BLADE) ×2
CHLORAPREP W/TINT 26ML (MISCELLANEOUS) ×3 IMPLANT
COVER BACK TABLE 60X90IN (DRAPES) ×3 IMPLANT
COVER MAYO STAND STRL (DRAPES) ×3 IMPLANT
DERMABOND ADVANCED (GAUZE/BANDAGES/DRESSINGS) ×2
DERMABOND ADVANCED .7 DNX12 (GAUZE/BANDAGES/DRESSINGS) ×1 IMPLANT
DRAPE LAPAROTOMY 100X72 PEDS (DRAPES) ×3 IMPLANT
DRAPE UTILITY XL STRL (DRAPES) ×3 IMPLANT
ELECT COATED BLADE 2.86 ST (ELECTRODE) ×3 IMPLANT
ELECT REM PT RETURN 9FT ADLT (ELECTROSURGICAL) ×3
ELECTRODE REM PT RTRN 9FT ADLT (ELECTROSURGICAL) ×1 IMPLANT
GLOVE BIOGEL PI IND STRL 8 (GLOVE) ×1 IMPLANT
GLOVE BIOGEL PI INDICATOR 8 (GLOVE) ×2
GLOVE ECLIPSE 8.0 STRL XLNG CF (GLOVE) ×3 IMPLANT
GOWN STRL REUS W/ TWL LRG LVL3 (GOWN DISPOSABLE) ×2 IMPLANT
GOWN STRL REUS W/TWL LRG LVL3 (GOWN DISPOSABLE) ×4
NEEDLE HYPO 25X1 1.5 SAFETY (NEEDLE) ×3 IMPLANT
NS IRRIG 1000ML POUR BTL (IV SOLUTION) ×3 IMPLANT
PACK BASIN DAY SURGERY FS (CUSTOM PROCEDURE TRAY) ×3 IMPLANT
PENCIL BUTTON HOLSTER BLD 10FT (ELECTRODE) ×3 IMPLANT
SLEEVE SCD COMPRESS KNEE MED (MISCELLANEOUS) ×3 IMPLANT
SPONGE LAP 4X18 X RAY DECT (DISPOSABLE) ×3 IMPLANT
SUT MON AB 4-0 PC3 18 (SUTURE) ×3 IMPLANT
SUT VICRYL 3-0 CR8 SH (SUTURE) ×3 IMPLANT
SYR CONTROL 10ML LL (SYRINGE) ×3 IMPLANT
TOWEL OR 17X24 6PK STRL BLUE (TOWEL DISPOSABLE) ×3 IMPLANT
TOWEL OR NON WOVEN STRL DISP B (DISPOSABLE) ×3 IMPLANT

## 2017-02-19 NOTE — Interval H&P Note (Signed)
History and Physical Interval Note:  02/19/2017 10:29 AM  Janet [name]  has presented today for surgery, with the diagnosis of RIGHT BREAST BLOODY NIPPLE DISCHARGE  The various methods of treatment have been discussed with the patient and family. After consideration of risks, benefits and other options for treatment, the patient has consented to  Procedure(s): RIGHT BREAST CENTRAL DUCT EXCISION (Right) as a surgical intervention .  The patient's history has been reviewed, patient examined, no change in status, stable for surgery.  I have reviewed the patient's chart and labs.  Questions were answered to the patient's satisfaction.     Loring Liskey A.

## 2017-02-19 NOTE — Discharge Instructions (Signed)
[facility] °Office Phone Number [phone] ° °BREAST BIOPSY/ LUMPECTOMY: POST OP INSTRUCTIONS ° °Always review your discharge instruction sheet given to you by the facility where your surgery was performed. ° °IF YOU HAVE DISABILITY OR FAMILY LEAVE FORMS, YOU MUST BRING THEM TO THE OFFICE FOR PROCESSING.  DO NOT GIVE THEM TO YOUR DOCTOR. ° °1. A prescription for pain medication may be given to you upon discharge.  Take your pain medication as prescribed, if needed.  If narcotic pain medicine is not needed, then you may take acetaminophen (Tylenol) or ibuprofen (Advil) as needed. °2. Take your usually prescribed medications unless otherwise directed °3. If you need a refill on your pain medication, please contact your pharmacy.  They will contact our office to request authorization.  Prescriptions will not be filled after 5pm or on week-ends. °4. You should eat very light the first 24 hours after surgery, such as soup, crackers, pudding, etc.  Resume your normal diet the day after surgery. °5. Most patients will experience some swelling and bruising in the breast.  Ice packs and a good support bra will help.  Swelling and bruising can take several days to resolve.  °6. It is common to experience some constipation if taking pain medication after surgery.  Increasing fluid intake and taking a stool softener will usually help or prevent this problem from occurring.  A mild laxative (Milk of Magnesia or Miralax) should be taken according to package directions if there are no bowel movements after 48 hours. °7. Unless discharge instructions indicate otherwise, you may remove your bandages 24-48 hours after surgery, and you may shower at that time.  You may have steri-strips (small skin tapes) in place directly over the incision.  These strips should be left on the skin for 7-10 days.  If your surgeon used skin glue on the incision, you may shower in 24 hours.  The glue will flake off over the next 2-3  weeks.  Any sutures or staples will be removed at the office during your follow-up visit. °8. ACTIVITIES:  You may resume regular daily activities (gradually increasing) beginning the next day.  Wearing a good support bra or sports bra minimizes pain and swelling.  You may have sexual intercourse when it is comfortable. °a. You may drive when you no longer are taking prescription pain medication, you can comfortably wear a seatbelt, and you can safely maneuver your car and apply brakes. °b. RETURN TO WORK:  ______________________________________________________________________________________ °9. You should see your doctor in the office for a follow-up appointment approximately two weeks after your surgery.  Your doctor’s nurse will typically make your follow-up appointment when she calls you with your pathology report.  Expect your pathology report 2-3 business days after your surgery.  You may call to check if you do not hear from us after three days. °10. OTHER INSTRUCTIONS: _______________________________________________________________________________________________ _____________________________________________________________________________________________________________________________________ °_____________________________________________________________________________________________________________________________________ °_____________________________________________________________________________________________________________________________________ ° °WHEN TO CALL YOUR DOCTOR: °1. Fever over 101.0 °2. Nausea and/or vomiting. °3. Extreme swelling or bruising. °4. Continued bleeding from incision. °5. Increased pain, redness, or drainage from the incision. ° °The clinic staff is available to answer your questions during regular business hours.  Please don’t hesitate to call and ask to speak to one of the nurses for clinical concerns.  If you have a medical emergency, go to the nearest emergency  room or call 911.  A surgeon from [facility] is always on call at the hospital. ° °For further questions, please visit [email or web address]  ° °  Post Anesthesia Home Care Instructions ° °Activity: °Get plenty of rest for the remainder of the day. A responsible individual must stay with you for 24 hours following the procedure.  °For the next 24 hours, DO NOT: °-Drive a car °-Operate machinery °-Drink alcoholic beverages °-Take any medication unless instructed by your physician °-Make any legal decisions or sign important papers. ° °Meals: °Start with liquid foods such as gelatin or soup. Progress to regular foods as tolerated. Avoid greasy, spicy, heavy foods. If nausea and/or vomiting occur, drink only clear liquids until the nausea and/or vomiting subsides. Call your physician if vomiting continues. ° °Special Instructions/Symptoms: °Your throat may feel dry or sore from the anesthesia or the breathing tube placed in your throat during surgery. If this causes discomfort, gargle with warm salt water. The discomfort should disappear within 24 hours. ° °If you had a scopolamine patch placed behind your ear for the management of post- operative nausea and/or vomiting: ° °1. The medication in the patch is effective for 72 hours, after which it should be removed.  Wrap patch in a tissue and discard in the trash. Wash hands thoroughly with soap and water. °2. You may remove the patch earlier than 72 hours if you experience unpleasant side effects which may include dry mouth, dizziness or visual disturbances. °3. Avoid touching the patch. Wash your hands with soap and water after contact with the patch. °  ° ° °

## 2017-02-19 NOTE — Op Note (Signed)
Preoperative diagnosis: Right nipple discharge and chronic mastitis  Postoperative diagnosis: Same  Procedure: Right breast central duct excision  Surgeon: Erroll Luna M.D.  Anesthesia: LMA with local anesthetic  EBL: Minimal  Specimen: Right breast central duct tissue to pathology  Drains: None  Indications for procedure: Patient presents for right breast central duct excision due to chronic right nipple discharge. She's had multiple infections in this region. Workup revealed a dilated duct system without pathology. She's been managed with local care and antibiotics. Unfortunately, she keeps of recurrent bouts of mastitis. I discussed options of continued medical management surgical management. The pros and cons of surgery as well as cosmesis were discussed with the patient. She opted for right breast central duct excision.The procedure has been discussed with the patient. Alternatives to surgery have been discussed with the patient.  Risks of surgery include bleeding,  Infection,  Seroma formation, death,  and the need for further surgery.   The patient understands and wishes to proceed.  Description of procedure: The patient was met in the holding area and questions are answered. The right breast was marked as the correct side. She was taken back to the operating room placed upon the OR table. After induction of LMA anesthesia, the right breast was prepped and draped sterile fashion. Timeout was done and she received preoperative antibiotics. A probe was used the central duct was identified brown drainage. Curvilinear incision was made along the lateral portion of the nipple complex. The nipple was elevated dissection was carried down to the probe and the doctor was causing the drainage. This is actually a large duct. There is also side ducts with inspissated mucus. This tissue was excised in its entirety. Cut back to normal healthy appearing breast tissue. There is no signs of any ongoing  infection at this point. This was copiously irrigated. Hemostasis was achieved. The deep layers were approximated with 3-0 Vicryl. 4 Monocryl was used to close the skin a subcuticular fashion. All final counts are found to be correct. Patient was awoke extubated taken to recovery in satisfactory condition.

## 2017-02-19 NOTE — Transfer of Care (Signed)
Immediate Anesthesia Transfer of Care Note  Patient: Janet [name]  Procedure(s) Performed: Procedure(s): RIGHT BREAST CENTRAL DUCT EXCISION (Right)  Patient Location: PACU  Anesthesia Type:General  Level of Consciousness: awake, alert , oriented and patient cooperative  Airway & Oxygen Therapy: Patient Spontanous Breathing  Post-op Assessment: Report given to RN and Post -op Vital signs reviewed and stable  Post vital signs: Reviewed and stable  Last Vitals:  Vitals:   02/19/17 1014 02/19/17 1150  BP: (!) 154/70 134/68  Pulse: 86 (!) 101  Resp: 17 16  Temp: 37.2 C 36.6 C  SpO2: 98% 100%    Last Pain:  Vitals:   02/19/17 1150  TempSrc:   PainSc: 0-No pain         Complications: No apparent anesthesia complications

## 2017-02-19 NOTE — Anesthesia Preprocedure Evaluation (Signed)
Anesthesia Evaluation  Patient identified by MRN, date of birth, ID band Patient awake    Reviewed: Allergy & Precautions, NPO status , Patient's Chart, lab work & pertinent test results  History of Anesthesia Complications (+) PONV and history of anesthetic complications  Airway Mallampati: II  TM Distance: >3 FB Neck ROM: Full    Dental  (+) Teeth Intact   Pulmonary shortness of breath and with exertion, asthma , former smoker,    Pulmonary exam normal breath sounds clear to auscultation       Cardiovascular hypertension, Pt. on medications Normal cardiovascular exam+ dysrhythmias  Rhythm:Regular Rate:Normal  LBBB Hx/o HOCM S/P septal myomectomy Mitral Valvuloplasty   Neuro/Psych Anxiety Prsonage-Turner syndrome -right  Neuromuscular disease    GI/Hepatic negative GI ROS, Neg liver ROS,   Endo/Other  negative endocrine ROS  Renal/GU negative Renal ROS  negative genitourinary   Musculoskeletal negative musculoskeletal ROS (+)   Abdominal   Peds  Hematology negative hematology ROS (+)   Anesthesia Other Findings   Reproductive/Obstetrics                             Anesthesia Physical Anesthesia Plan  ASA: II  Anesthesia Plan: General   Post-op Pain Management:    Induction: Intravenous  PONV Risk Score and Plan: 4 or greater and Ondansetron, Dexamethasone, Midazolam and Propofol infusion  Airway Management Planned: LMA  Additional Equipment:   Intra-op Plan:   Post-operative Plan: Extubation in OR  Informed Consent: I have reviewed the patients History and Physical, chart, labs and discussed the procedure including the risks, benefits and alternatives for the proposed anesthesia with the patient or authorized representative who has indicated his/her understanding and acceptance.   Dental advisory given  Plan Discussed with: CRNA, Anesthesiologist and  Surgeon  Anesthesia Plan Comments:         Anesthesia Quick Evaluation

## 2017-02-19 NOTE — Anesthesia Postprocedure Evaluation (Signed)
Anesthesia Post Note  Patient: Janet [name]  Procedure(s) Performed: Procedure(s) (LRB): RIGHT BREAST CENTRAL DUCT EXCISION (Right)     Patient location during evaluation: PACU Anesthesia Type: General Level of consciousness: awake and alert Pain management: pain level controlled Vital Signs Assessment: post-procedure vital signs reviewed and stable Respiratory status: spontaneous breathing, nonlabored ventilation, respiratory function stable and patient connected to nasal cannula oxygen Cardiovascular status: blood pressure returned to baseline and stable Postop Assessment: no signs of nausea or vomiting Anesthetic complications: no    Last Vitals:  Vitals:   02/19/17 1215 02/19/17 1232  BP: (!) 136/56 (!) 135/59  Pulse: 95 93  Resp: 12 18  Temp:  36.5 C  SpO2: 99% 99%    Last Pain:  Vitals:   02/19/17 1232  TempSrc:   PainSc: 0-No pain                 Catalina Gravel

## 2017-02-19 NOTE — Anesthesia Procedure Notes (Signed)
Procedure Name: Intubation Date/Time: 02/19/2017 10:49 AM Performed by: Oletta Lamas Pre-anesthesia Checklist: Patient identified, Emergency Drugs available, Suction available and Patient being monitored Patient Re-evaluated:Patient Re-evaluated prior to induction Oxygen Delivery Method: Circle System Utilized Preoxygenation: Pre-oxygenation with 100% oxygen Induction Type: IV induction Ventilation: Mask ventilation without difficulty LMA: LMA inserted LMA Size: 4.0 Tube type: Oral Number of attempts: 1 Airway Equipment and Method: Stylet Placement Confirmation: ETT inserted through vocal cords under direct vision,  positive ETCO2 and breath sounds checked- equal and bilateral Tube secured with: Tape Dental Injury: Teeth and Oropharynx as per pre-operative assessment

## 2017-02-19 NOTE — H&P (Signed)
Janet [name] is an 66 y.o. female.   Chief Complaint: right breast mastitis HPI: Pt with central duct drainage and multiple infections.  She has multiple infections and has failed medical management and has failed.  She has a dilated central duct.    Past Medical History:  Diagnosis Date  . Abrasion of elbow, right 12/29/2015  . Anxiety   . Asthma    daily and prn inhalers  . Breast discharge    right bloody x 2 months  . High cholesterol    takes lipitor  . HOCM (hypertrophic obstructive cardiomyopathy) ([place])   . Hypertension    states under control with med., has been on med. x "years"  . Left bundle branch block (LBBB)   . Leg skin lesion, left    mole removed 12/27/2015  . Melanoma ([place])   . Nipple lesion 12/2015   right nipple mass  . [place]-Turner syndrome 02/02/2016   right  . PONV (postoperative nausea and vomiting)     Past Surgical History:  Procedure Laterality Date  . APPENDECTOMY     age 34  . BREAST EXCISIONAL BIOPSY    . CARDIAC SURGERY  03/09/2014   septal myectomy  . HYSTEROSCOPY W/D&C  07/21/2004   submucosal myomectomy  . INFERIOR OBLIQUE MYECTOMY  2015  . MASS EXCISION Right 01/04/2016   Procedure: EXCISION OF 1CM RIGHT NIPPLE MASS;  Surgeon: Erroll Luna, MD;  Location: [facility];  Service: General;  Laterality: Right;  . MITRAL VALVE REPAIR  03/09/2014  . NAILBED REPAIR Right 10/14/2012   Procedure: BIOPSY NAIL MATRIX RIGHT THUMB;  Surgeon: Wynonia Sours, MD;  Location: [facility];  Service: Orthopedics;  Laterality: Right;  . SALPINGOOPHORECTOMY  2017  . TEE WITHOUT CARDIOVERSION N/A 11/10/2013   Procedure: TRANSESOPHAGEAL ECHOCARDIOGRAM (TEE);  Surgeon: Dorothy Spark, MD;  Location: [facility] ENDOSCOPY;  Service: Cardiovascular;  Laterality: N/A;    Family History  Problem Relation Age of Onset  . Arrhythmia Father        A fib  . Hypertension Father   . Arrhythmia Brother        A fib  . Heart disease Brother    SBE, MV repair  . Breast cancer Maternal Grandmother   . Breast cancer Mother   . Hypertension Mother    Social History:  reports that she quit smoking about 30 years ago. She has never used smokeless tobacco. She reports that she does not drink alcohol or use drugs.  Allergies:  Allergies  Allergen Reactions  . Oxycontin [Oxycodone] Hives  . Pneumococcal Vaccines Swelling  . Hydrocodone Nausea And Vomiting    Medications Prior to Admission  Medication Sig Dispense Refill  . albuterol (PROVENTIL HFA;VENTOLIN HFA) 108 (90 BASE) MCG/ACT inhaler Inhale 2 puffs into the lungs every 6 (six) hours as needed for wheezing.    Marland Kitchen aspirin 81 MG tablet Take 81 mg by mouth daily.    Marland Kitchen atorvastatin (LIPITOR) 20 MG tablet Take 20 mg by mouth daily.    . Cholecalciferol (HM VITAMIN D3) 4000 UNITS CAPS Take 4,000 Units by mouth daily.    . citalopram (CELEXA) 40 MG tablet Take 40 mg by mouth daily.    . Coenzyme Q10 (CO Q-10) 300 MG CAPS Take 300 mg by mouth daily.    . Fluticasone Propionate HFA (FLOVENT HFA IN) Inhale into the lungs.    . irbesartan-hydrochlorothiazide (AVALIDE) 300-12.5 MG per tablet Take 1 tablet by mouth daily.     Marland Kitchen  Omega-3 Fatty Acids (FISH OIL) 1200 MG CAPS Take 1 capsule by mouth daily.      No results found for this or any previous visit (from the past 48 hour(s)). No results found.  Review of Systems  Constitutional: Negative for chills and fever.  HENT: Negative for hearing loss.   Eyes: Negative for blurred vision.  Respiratory: Negative for cough.   Gastrointestinal: Negative for heartburn.  Musculoskeletal: Negative for myalgias.    Blood pressure (!) 154/70, pulse 86, temperature 99 F (37.2 C), temperature source Oral, resp. rate 17, height 5\' 1"  (1.549 m), weight 80.8 kg (178 lb 3.2 oz), SpO2 98 %. Physical Exam  Constitutional: She appears well-developed and well-nourished.  HENT:  Head: Normocephalic.  Eyes: Pupils are equal, round, and reactive to  light.  Cardiovascular: Normal rate.   Respiratory: Right breast exhibits nipple discharge. Left breast exhibits no mass and no nipple discharge.    Neurological: She is alert.  Skin: Skin is warm and dry.     Assessment/Plan Right breast chronic mastitis and right nipple discharge with otherwise negative work up Discussed options of medical and surgical management  She has opted for duct excision.The procedure has been discussed with the patient.  Alternative therapies have been discussed with the patient.  Operative risks include bleeding,  Infection,  Organ injury,  Nerve injury,  Blood vessel injury,  DVT,  Pulmonary embolism,  Death,  And possible reoperation.  Medical management risks include worsening of present situation.  The success of the procedure is 50 -90 % at treating patients symptoms.  The patient understands and agrees to proceed.  Inioluwa Baris A., MD 02/19/2017, 10:24 AM

## 2017-02-20 ENCOUNTER — Encounter (HOSPITAL_BASED_OUTPATIENT_CLINIC_OR_DEPARTMENT_OTHER): Payer: Self-pay | Admitting: Surgery

## 2017-04-27 IMAGING — CT CT L SPINE W/ CM
1 of 6 series · 6 of 14 positions shown, 8 images · non-contrast
Comparison: none

CLINICAL DATA: Cervical and lumbar radiculopathy. Right shoulder
and right arm pain. Anterior right lower extremity pain.
TECHNIQUE: Contiguous axial images were obtained through the Cervical and
Lumbar spine after the intrathecal infusion of infusion. Coronal and
sagittal reconstructions were obtained of the axial image sets.

[Series 3: l spine soft · axial · 0.31mm/px · z∈[+276,+432]mm · 6 of 74 slices shown, 8 images]
[im 11/74  soft-tissue]
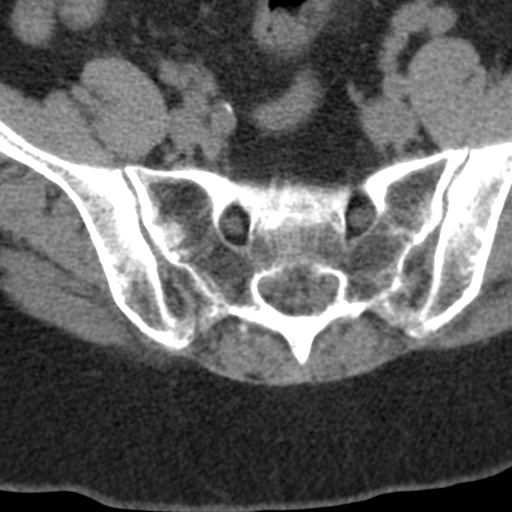
[im 11/74  bone]
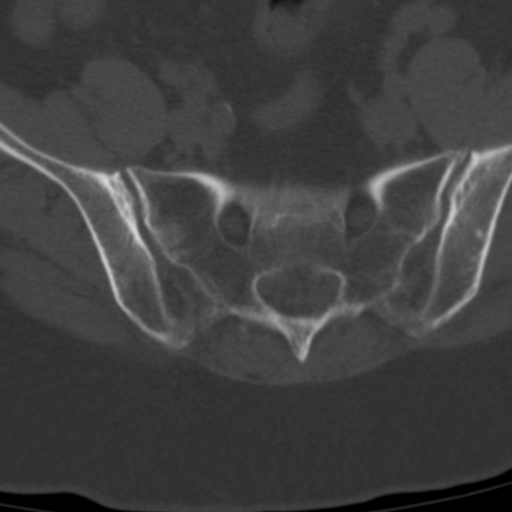
[im 21/74  bone]
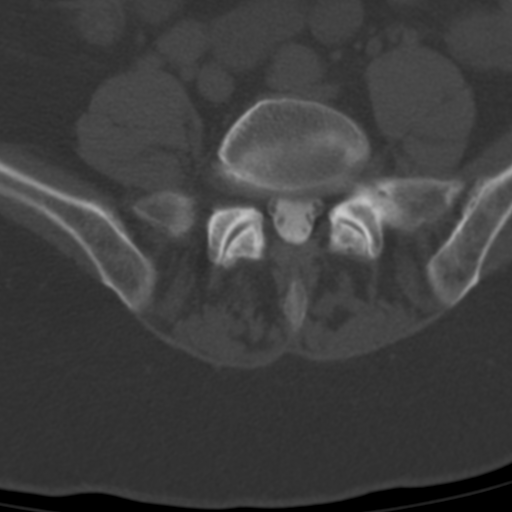
[im 32/74  bone]
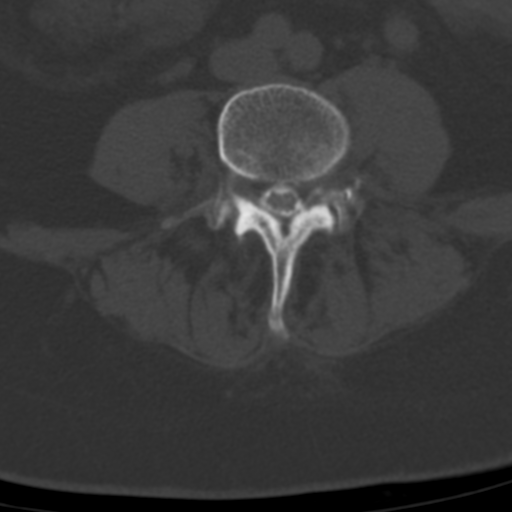
[im 42/74  bone]
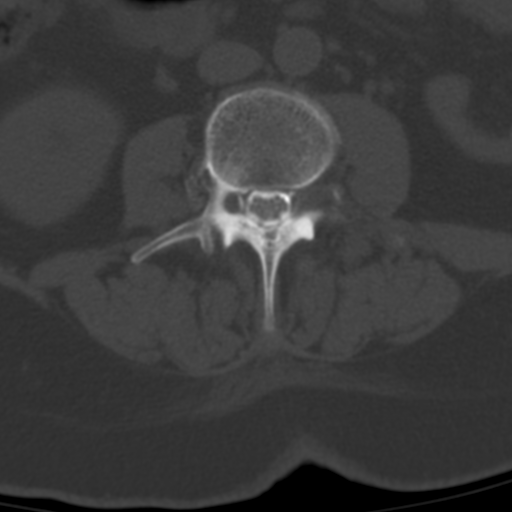
[im 53/74  soft-tissue]
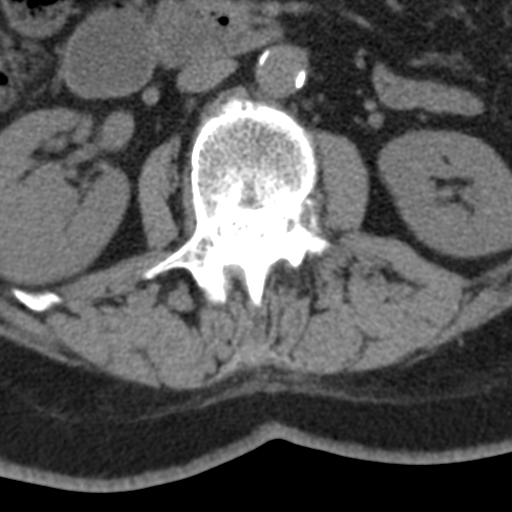
[im 53/74  bone]
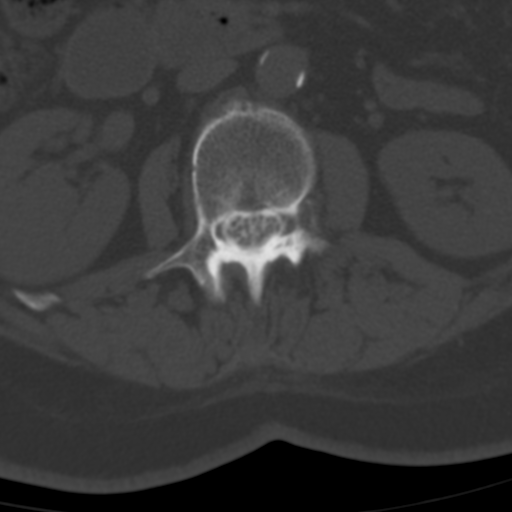
[im 63/74  bone]
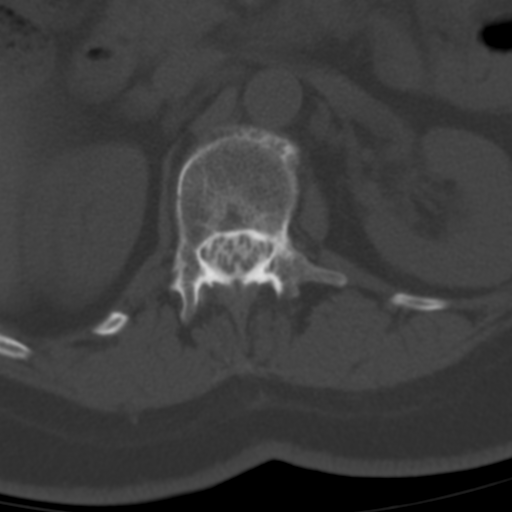

[6 of 14 positions shown; findings below may reference images not displayed]

FLUOROSCOPY TIME:  Radiation Exposure Index (as provided by the
fluoroscopic device): 265.14 microGray*m^2

Fluoroscopy Time (in minutes and seconds):  59 seconds

PROCEDURE:
LUMBAR PUNCTURE FOR CERVICAL AND LUMBAR MYELOGRAM

CERVICAL AND LUMBAR MYELOGRAM

CT CERVICAL MYELOGRAM

CT LUMBAR MYELOGRAM

After thorough discussion of risks and benefits of the procedure
including bleeding, infection, injury to nerves, blood vessels,
adjacent structures as well as headache and CSF leak, written and
oral informed consent was obtained. Consent was obtained by Dr.
Mkachak Gorrab.

Patient was positioned prone on the fluoroscopy table. Local
anesthesia was provided with 1% lidocaine without epinephrine after
prepped and draped in the usual sterile fashion. Puncture was
performed at L3-4 using a 3 1/2 inch 22-gauge spinal needle via a
left interlaminar approach. Using a single pass through the dura,
the needle was placed within the thecal sac, with return of clear
CSF. 10 mL of Isovue M 300 was injected into the thecal sac, with
normal opacification of the nerve roots and cauda equina consistent
with free flow within the subarachnoid space. The patient was then
moved to the trendelenburg position and contrast flowed into the
Cervical spine region.

I personally performed the lumbar puncture and administered the
intrathecal contrast. I also personally supervised acquisition of
the myelogram images.
FINDINGS: CERVICAL AND LUMBAR MYELOGRAM FINDINGS:

There is slight anterolisthesis at slight anterolisthesis is again
noted of C7 on T1. There are small ventral extradural defects from
C[DATE]- C7-T1, most notable at C5-6 where there is likely mild spinal
stenosis. There is suggestion of right-sided extradural defects at
C3-4 and C4-5 with potential involvement of the C4 and C5 nerves.

Mixed injection with small amount of subdural contrast noted in the
lumbar spine. Lumbar vertebral alignment is normal without evidence
of abnormal motion on upright flexion or extension images. There is
moderate, waist like narrowing of the thecal sac at the L4-5 level
most notably transversely with narrowing of the lateral recesses
resulting in bilateral L5 nerve root sleeve cut off. There is a very
shallow ventral extradural defect at L3-4 without evidence of
significant stenosis. No significant change is seen between lateral
prone and upright images.

CT CERVICAL MYELOGRAM FINDINGS:

Slight anterolisthesis of C7 on T1 is unchanged and measures 2 mm.
There is straightening of the normal lordosis in the mid to upper
cervical spine. There is moderate disc space narrowing at C5-6 and
C6-7 with degenerative endplate changes at both levels, greater at
C5-6. Mild disc space narrowing is present at C4-5 and C7-T1.
Paraspinal soft tissues are unremarkable.

C2-3:  Mild left facet arthrosis without stenosis.

C3-4: Small right paracentral disc protrusion slightly flattens the
right ventral spinal cord and may affect the right C5 nerve root in
the canal, unchanged. Mild uncovertebral spurring and moderate left
facet arthrosis result in mild bilateral neural foraminal narrowing.

C4-5: Mild disc bulging and mild right facet arthrosis result in
mild right neural foraminal stenosis without spinal stenosis,
unchanged.

C5-6: Broad-based posterior disc osteophyte complex and mild
bilateral facet arthrosis result in mild spinal stenosis and
mild-to-moderate bilateral neural foraminal stenosis, unchanged.

C6-7: Broad-based posterior disc osteophyte complex and moderate
right and mild left facet arthrosis result in mild bilateral neural
foraminal stenosis without spinal stenosis, unchanged.

C7-T1: Listhesis with mild disc uncovering and minimal right and
moderate left facet arthrosis result in mild left neural foraminal
narrowing without spinal stenosis, unchanged.

CT LUMBAR MYELOGRAM FINDINGS:

Vertebral alignment is normal. Vertebral body heights are preserved
without evidence of fracture. There is mild disc space narrowing at
L4-5. The conus medullaris terminates at L1. A simple appearing
cystic structure is partially visualized in the pelvis just anterior
to the sacrum measuring at least 7 cm in size. There is mild
calcified aortic atherosclerosis.

L1-2: Mild disc bulging asymmetric to the right and moderate right
facet arthrosis without significant stenosis.

L2-3: Mild disc bulging asymmetric to the right and mild bilateral
facet hypertrophy without stenosis. A right extraforaminal disc
protrusion could affect the right L2 nerve lateral to the foramen.

L3-4: Mild disc bulging slightly asymmetric to the right and
moderate right and mild left facet hypertrophy result in mild left
neural foraminal narrowing without significant spinal stenosis.

L4-5: Moderate circumferential disc bulging asymmetric to the right
and mild-to-moderate facet hypertrophy result in mild spinal
stenosis, moderate bilateral lateral recess stenosis, and mild left
greater than right neural foraminal stenosis. The L5 nerve root may
be affected in the lateral recesses. Disc could also affect the
right L4 nerve lateral to the foramen.

L5-S1: Mild disc bulging slightly asymmetric to the right and mild
bilateral facet arthrosis result in mild right neural foraminal
stenosis without spinal stenosis.
IMPRESSION: 1. Multilevel cervical disc degeneration, worst at C5-6 where there
is mild spinal stenosis and mild-to-moderate bilateral neural
foraminal stenosis.
2. Mild neural foraminal stenosis elsewhere in the cervical spine as
above.
3. Multilevel lumbar disc degeneration, greatest at L4-5 where there
is moderate bilateral lateral recess stenosis potentially affecting
the L5 nerve roots. Mild spinal and mild bilateral foraminal
stenosis.
4. Right extraforaminal disc protrusion at L2-3 which could affect
the right L2 nerve.
5. Mild right neural foraminal stenosis at L5-S1.
6. Partially visualized cystic structure in the pelvis. If the
patient has had a prior hysterectomy, this may represent a
posteriorly located bladder. If not, this may represent a cystic
adnexal lesion, and pelvic ultrasound could be performed to further
evaluate.

## 2017-05-10 ENCOUNTER — Encounter: Payer: Self-pay | Admitting: Cardiology

## 2017-05-21 NOTE — Progress Notes (Signed)
Janet [name] Date of Birth: 16-Oct-1950 Medical Record #[id number]  History of Present Illness: Janet [name] is seen for follow HOCM. She is  s/p septal myectomy and MV repair at [facility] clinic on March 09, 2014. This was performed for HOCM with severe LV outflow gradient as well as MV prolapse with severe posterior MV prolapse and severe MR. She had extended septal myectomy with triangular resection of the posterior MV leaflet with a C shaped annuloplasty ring. She did very well in the post op period. No Afib. She did develop a post op LBBB.  Follow up Echo in December 2015 showed no LVOT gradient and only trivial MR. Repeat Echo in July 2017 was stable.   On follow up today she reports more SOB with exertion particularly going up stairs. Last July she completed a 5 K race. In August she had breast surgery (benign) and afterwards she did not resume running. She has gained 8 lbs. She recently went on a river cruise and ate too much. She is still walking some but not as much. She denies any palpitations, chest pain, edema.     Current Outpatient Medications on File Prior to Visit  Medication Sig Dispense Refill  . albuterol (PROVENTIL HFA;VENTOLIN HFA) 108 (90 BASE) MCG/ACT inhaler Inhale 2 puffs into the lungs every 6 (six) hours as needed for wheezing.    Marland Kitchen aspirin 81 MG tablet Take 81 mg by mouth daily.    Marland Kitchen atorvastatin (LIPITOR) 20 MG tablet Take 20 mg by mouth daily.    . Cholecalciferol (HM VITAMIN D3) 4000 UNITS CAPS Take 4,000 Units by mouth daily.    . citalopram (CELEXA) 40 MG tablet Take 40 mg by mouth daily.    . Coenzyme Q10 (CO Q-10) 300 MG CAPS Take 300 mg by mouth daily.    . Fluticasone Propionate HFA (FLOVENT HFA IN) Inhale into the lungs.    . Omega-3 Fatty Acids (FISH OIL) 1200 MG CAPS Take 1 capsule by mouth daily.    . traMADol (ULTRAM) 50 MG tablet Take 1 tablet (50 mg total) by mouth every 6 (six) hours as needed. 15 tablet 0   No current facility-administered medications  on file prior to visit.     Allergies  Allergen Reactions  . Oxycontin [Oxycodone] Hives  . Pneumococcal Vaccines Swelling  . Hydrocodone Nausea And Vomiting    Past Medical History:  Diagnosis Date  . Abrasion of elbow, right 12/29/2015  . Anxiety   . Asthma    daily and prn inhalers  . Breast discharge    right bloody x 2 months  . High cholesterol    takes lipitor  . HOCM (hypertrophic obstructive cardiomyopathy) ([place])   . Hypertension    states under control with med., has been on med. x "years"  . Left bundle branch block (LBBB)   . Leg skin lesion, left    mole removed 12/27/2015  . Melanoma ([place])   . Nipple lesion 12/2015   right nipple mass  . Parson[place]-Turner syndrome 02/02/2016   right  . PONV (postoperative nausea and vomiting)     Past Surgical History:  Procedure Laterality Date  . APPENDECTOMY     age 80  . BIOPSY NAIL MATRIX RIGHT THUMB Right 10/14/2012   Performed by Wynonia Sours, MD at [facility]  . BREAST EXCISIONAL BIOPSY    . CARDIAC SURGERY  03/09/2014   septal myectomy  . EXCISION OF 1CM RIGHT NIPPLE MASS Right 01/04/2016  Performed by Erroll Luna, MD at [facility]  . HYSTEROSCOPY W/D&C  07/21/2004   submucosal myomectomy  . INFERIOR OBLIQUE MYECTOMY  2015  . MITRAL VALVE REPAIR  03/09/2014  . RIGHT BREAST CENTRAL DUCT EXCISION Right 02/19/2017   Performed by Erroll Luna, MD at [facility]  . SALPINGOOPHORECTOMY  2017  . TRANSESOPHAGEAL ECHOCARDIOGRAM (TEE) N/A 11/10/2013   Performed by Dorothy Spark, MD at [place] History   Tobacco Use  Smoking Status Former Smoker  . Last attempt to quit: 07/08/1986  . Years since quitting: 30.8  Smokeless Tobacco Never Used    Social History   Substance and Sexual Activity  Alcohol Use No    Family History  Problem Relation Age of Onset  . Arrhythmia Father        A fib  . Hypertension Father   . Arrhythmia Brother        A  fib  . Heart disease Brother        SBE, MV repair  . Breast cancer Maternal Grandmother   . Breast cancer Mother   . Hypertension Mother     Review of Systems: As noted in HPI.  All other systems were reviewed and are negative.  Physical Exam: BP (!) 143/75   Pulse 79   Ht 5\' 1"  (1.549 m)   Wt 183 lb 3.2 oz (83.1 kg)   SpO2 93%   BMI 34.62 kg/m   GENERAL:  Well appearing HEENT:  PERRL, EOMI, sclera are clear. Oropharynx is clear. NECK:  No jugular venous distention, carotid upstroke brisk and symmetric, no bruits, no thyromegaly or adenopathy LUNGS:  Clear to auscultation bilaterally CHEST:  Unremarkable HEART:  RRR,  PMI not displaced or sustained,S1 and S2 within normal limits, no S3, no S4: no clicks, no rubs, no murmurs ABD:  Soft, nontender. BS +, no masses or bruits. No hepatomegaly, no splenomegaly EXT:  2 + pulses throughout, no edema, no cyanosis no clubbing SKIN:  Warm and dry.  No rashes NEURO:  Alert and oriented x 3. Cranial nerves II through XII intact. PSYCH:  Cognitively intact    LABORATORY DATA:  Lab Results  Component Value Date   HGB 12.8 10/14/2012   GLUCOSE 112 (H) 02/12/2017   NA 140 02/12/2017   K 3.8 02/12/2017   CL 102 02/12/2017   CREATININE 0.63 02/12/2017   BUN 11 02/12/2017   CO2 31 02/12/2017   HGBA1C 5.8 (H) 02/02/2016   Labs dated 11/03/15: cholesterol 159, triglycerides 155, LDL 78, HDL 50.  Dated 11/23/16: cholesterol 156, triglycerides 158, HDL 51, LDL 74. A1c 6%.   Ecg today shows NSR with first degree AV block. LAD, LBBB.  I have personally reviewed and interpreted this study.   Echo 01/31/16: Study Conclusions  - Left ventricle: The cavity size was normal. There was mild   concentric hypertrophy. Systolic function was normal. The   estimated ejection fraction was in the range of 60% to 65%. No   VSD. Wall motion was normal; there were no regional wall motion   abnormalities. Doppler parameters are consistent with  abnormal   left ventricular relaxation (grade 1 diastolic dysfunction). - Mitral valve: Post mitral valve repair post myomectomy. Mobility   was restricted. - Left atrium: The atrium was mildly dilated. - Pulmonic valve: There was moderate regurgitation. - Pulmonary arteries: Systolic pressure was mildly increased. PA   peak pressure: 33 mm Hg (S).  Assessment / Plan: 1. MV prolapse with severe mitral insufficiency.Now s/p posterior MV leaflet repair. Trivial residual insufficiency by prior Echo. No murmur on exam today.   2. HOCM s/p septal myectomy. No residual gradient.   3. PVCs- resolved post surgery.   4.  HTN - controlled on [place].  5. LBBB. Persistent by Ecg June 2017.  6. Dyspnea on exertion. While this could be related to diastolic dysfunction I really don't see evidence of volume overload today. She has gained a lot of weight and she has not been following a good diet and has not been exercising as much. Encourage better diet habits and weight loss. Increase aerobic activity. If symptoms progress she is to let me know.  Will plan follow up in 6 months.

## 2017-05-24 ENCOUNTER — Ambulatory Visit: Payer: Medicare Other | Admitting: Cardiology

## 2017-05-24 ENCOUNTER — Encounter: Payer: Self-pay | Admitting: Cardiology

## 2017-05-24 VITALS — BP 143/75 | HR 79 | Ht 61.0 in | Wt 183.2 lb

## 2017-05-24 DIAGNOSIS — I447 Left bundle-branch block, unspecified: Secondary | ICD-10-CM | POA: Diagnosis not present

## 2017-05-24 DIAGNOSIS — I421 Obstructive hypertrophic cardiomyopathy: Secondary | ICD-10-CM

## 2017-05-24 DIAGNOSIS — Z9889 Other specified postprocedural states: Secondary | ICD-10-CM

## 2017-05-24 DIAGNOSIS — I341 Nonrheumatic mitral (valve) prolapse: Secondary | ICD-10-CM

## 2017-05-24 MED ORDER — IRBESARTAN-HYDROCHLOROTHIAZIDE 300-12.5 MG PO TABS: 1.0000 | ORAL_TABLET | Freq: Every day | ORAL | 3 refills | Status: DC

## 2017-05-24 NOTE — Patient Instructions (Signed)
Continue your current therapy  Work on weight loss and increased exercise  I will see you in 6 months

## 2017-11-30 NOTE — Progress Notes (Signed)
Janet [name] Date of Birth: 1950/07/17 Medical Record #[id number]  History of Present Illness: Janet [name] is seen for follow HOCM. She is  s/p septal myectomy and MV repair at [facility] clinic on March 09, 2014. This was performed for HOCM with severe LV outflow gradient as well as MV prolapse with severe posterior MV prolapse and severe MR. She had extended septal myectomy with triangular resection of the posterior MV leaflet with a C shaped annuloplasty ring. She did very well in the post op period. No Afib. She did develop a post op LBBB.  Follow up Echo in December 2015 showed no LVOT gradient and only trivial MR. Repeat Echo in July 2017 was stable.  On follow up today she reports that her breathing is doing better. She is exercising 3-4 days/week at the gym. She can walk well on a treadmill but does get SOB going up stairs.  She denies any palpitations, chest pain, edema.  She did stop taking ASA on her own.   Current Outpatient Medications on File Prior to Visit  Medication Sig Dispense Refill  . albuterol (PROVENTIL HFA;VENTOLIN HFA) 108 (90 BASE) MCG/ACT inhaler Inhale 2 puffs into the lungs every 6 (six) hours as needed for wheezing.    Marland Kitchen atorvastatin (LIPITOR) 20 MG tablet Take 20 mg by mouth daily.    . Cholecalciferol (HM VITAMIN D3) 4000 UNITS CAPS Take 4,000 Units by mouth daily.    . citalopram (CELEXA) 40 MG tablet Take 30 mg by mouth daily. Patient is triturating medication down now taking 30mg     . Coenzyme Q10 (CO Q-10) 300 MG CAPS Take 300 mg by mouth daily.    . Fluticasone Propionate HFA (FLOVENT HFA IN) Inhale into the lungs.    . irbesartan-hydrochlorothiazide (AVALIDE) 300-12.5 MG tablet Take 1 tablet daily by mouth. 90 tablet 3  . Omega-3 Fatty Acids (FISH OIL) 1200 MG CAPS Take 1 capsule by mouth daily.     No current facility-administered medications on file prior to visit.     Allergies  Allergen Reactions  . Oxycontin [Oxycodone] Hives  . Pneumococcal Vaccines  Swelling  . Hydrocodone Nausea And Vomiting    Past Medical History:  Diagnosis Date  . Abrasion of elbow, right 12/29/2015  . Anxiety   . Asthma    daily and prn inhalers  . Breast discharge    right bloody x 2 months  . High cholesterol    takes lipitor  . HOCM (hypertrophic obstructive cardiomyopathy) ([place])   . Hypertension    states under control with med., has been on med. x "years"  . Left bundle branch block (LBBB)   . Leg skin lesion, left    mole removed 12/27/2015  . Melanoma ([place])   . Nipple lesion 12/2015   right nipple mass  . Parsonage-Turner syndrome 02/02/2016   right  . PONV (postoperative nausea and vomiting)     Past Surgical History:  Procedure Laterality Date  . APPENDECTOMY     age 89  . BREAST DUCTAL SYSTEM EXCISION Right 02/19/2017   Procedure: RIGHT BREAST CENTRAL DUCT EXCISION;  Surgeon: Erroll Luna, MD;  Location: [place];  Service: General;  Laterality: Right;  . BREAST EXCISIONAL BIOPSY    . CARDIAC SURGERY  03/09/2014   septal myectomy  . HYSTEROSCOPY W/D&C  07/21/2004   submucosal myomectomy  . INFERIOR OBLIQUE MYECTOMY  2015  . MASS EXCISION Right 01/04/2016   Procedure: EXCISION OF 1CM RIGHT NIPPLE MASS;  Surgeon:  Erroll Luna, MD;  Location: [place];  Service: General;  Laterality: Right;  . MITRAL VALVE REPAIR  03/09/2014  . NAILBED REPAIR Right 10/14/2012   Procedure: BIOPSY NAIL MATRIX RIGHT THUMB;  Surgeon: Wynonia Sours, MD;  Location: [place];  Service: Orthopedics;  Laterality: Right;  . SALPINGOOPHORECTOMY  2017  . TEE WITHOUT CARDIOVERSION N/A 11/10/2013   Procedure: TRANSESOPHAGEAL ECHOCARDIOGRAM (TEE);  Surgeon: Dorothy Spark, MD;  Location: [facility] ENDOSCOPY;  Service: Cardiovascular;  Laterality: N/A;    Social History   Tobacco Use  Smoking Status Former Smoker  . Last attempt to quit: 07/08/1986  . Years since quitting: 31.4  Smokeless Tobacco Never Used    Social  History   Substance and Sexual Activity  Alcohol Use No    Family History  Problem Relation Age of Onset  . Arrhythmia Father        A fib  . Hypertension Father   . Arrhythmia Brother        A fib  . Heart disease Brother        SBE, MV repair  . Breast cancer Maternal Grandmother   . Breast cancer Mother   . Hypertension Mother     Review of Systems: As noted in HPI.  All other systems were reviewed and are negative.  Physical Exam: BP 102/70   Pulse 68   Ht 5\' 1"  (1.549 m)   Wt 184 lb (83.5 kg)   BMI 34.77 kg/m   GENERAL:  Well appearing WF in NAD HEENT:  PERRL, EOMI, sclera are clear. Oropharynx is clear. NECK:  No jugular venous distention, carotid upstroke brisk and symmetric, no bruits, no thyromegaly or adenopathy LUNGS:  Clear to auscultation bilaterally CHEST:  Unremarkable HEART:  RRR,  PMI not displaced or sustained,S1 and S2 within normal limits, no S3, no S4: no clicks, no rubs, no murmurs ABD:  Soft, nontender. BS +, no masses or bruits. No hepatomegaly, no splenomegaly EXT:  2 + pulses throughout, no edema, no cyanosis no clubbing SKIN:  Warm and dry.  No rashes NEURO:  Alert and oriented x 3. Cranial nerves II through XII intact. PSYCH:  Cognitively intact       LABORATORY DATA:  Lab Results  Component Value Date   HGB 12.8 10/14/2012   GLUCOSE 112 (H) 02/12/2017   NA 140 02/12/2017   K 3.8 02/12/2017   CL 102 02/12/2017   CREATININE 0.63 02/12/2017   BUN 11 02/12/2017   CO2 31 02/12/2017   HGBA1C 5.8 (H) 02/02/2016   Labs dated 11/03/15: cholesterol 159, triglycerides 155, LDL 78, HDL 50.  Dated 11/23/16: cholesterol 156, triglycerides 158, HDL 51, LDL 74. A1c 6%.   Ecg today shows NSR with first degree AV block. LAD, LBBB.  I have personally reviewed and interpreted this study.   Echo 01/31/16: Study Conclusions  - Left ventricle: The cavity size was normal. There was mild   concentric hypertrophy. Systolic function was normal.  The   estimated ejection fraction was in the range of 60% to 65%. No   VSD. Wall motion was normal; there were no regional wall motion   abnormalities. Doppler parameters are consistent with abnormal   left ventricular relaxation (grade 1 diastolic dysfunction). - Mitral valve: Post mitral valve repair post myomectomy. Mobility   was restricted. - Left atrium: The atrium was mildly dilated. - Pulmonic valve: There was moderate regurgitation. - Pulmonary arteries: Systolic pressure was mildly increased. PA  peak pressure: 33 mm Hg (S).   Assessment / Plan: 1. MV prolapse with severe mitral insufficiency.Now s/p posterior MV leaflet repair. Trivial residual insufficiency by prior Echo. No murmur on exam today. Given prior repair I have recommended she resume ASA 81 mg daily.   2. HOCM s/p septal myectomy. No residual gradient.   3. PVCs- resolved post surgery.   4.  HTN - controlled on Avalide.  5. LBBB. Persistent by Ecg June 2017.   Will plan follow up in 6 months.

## 2017-12-06 ENCOUNTER — Ambulatory Visit: Payer: Medicare Other | Admitting: Cardiology

## 2017-12-06 ENCOUNTER — Encounter: Payer: Self-pay | Admitting: Cardiology

## 2017-12-06 VITALS — BP 102/70 | HR 68 | Ht 61.0 in | Wt 184.0 lb

## 2017-12-06 DIAGNOSIS — Z9889 Other specified postprocedural states: Secondary | ICD-10-CM | POA: Diagnosis not present

## 2017-12-06 DIAGNOSIS — I447 Left bundle-branch block, unspecified: Secondary | ICD-10-CM

## 2017-12-06 DIAGNOSIS — I10 Essential (primary) hypertension: Secondary | ICD-10-CM

## 2017-12-06 DIAGNOSIS — I421 Obstructive hypertrophic cardiomyopathy: Secondary | ICD-10-CM | POA: Diagnosis not present

## 2017-12-06 MED ORDER — ASPIRIN 81 MG PO TABS: 81.0000 mg | ORAL_TABLET | Freq: Every day | ORAL | Status: AC

## 2017-12-06 NOTE — Patient Instructions (Signed)
Resume ASA 81 mg daily  Continue your other therapy  Continue to work on your exercise.

## 2017-12-09 ENCOUNTER — Other Ambulatory Visit: Payer: Self-pay | Admitting: Obstetrics & Gynecology

## 2017-12-09 DIAGNOSIS — Z1231 Encounter for screening mammogram for malignant neoplasm of breast: Secondary | ICD-10-CM

## 2017-12-31 ENCOUNTER — Ambulatory Visit
Admission: RE | Admit: 2017-12-31 | Discharge: 2017-12-31 | Disposition: A | Discharge status: Home / Self Care | Payer: Medicare Other | Source: Ambulatory Visit | Attending: Obstetrics & Gynecology | Admitting: Obstetrics & Gynecology

## 2017-12-31 DIAGNOSIS — Z1231 Encounter for screening mammogram for malignant neoplasm of breast: Secondary | ICD-10-CM

## 2018-03-28 ENCOUNTER — Other Ambulatory Visit: Payer: Self-pay

## 2018-03-28 MED ORDER — IRBESARTAN-HYDROCHLOROTHIAZIDE 300-12.5 MG PO TABS: 1.0000 | ORAL_TABLET | Freq: Every day | ORAL | 2 refills | Status: DC

## 2018-04-21 ENCOUNTER — Telehealth: Payer: Self-pay | Admitting: Cardiology

## 2018-04-21 MED ORDER — HYDROCHLOROTHIAZIDE 12.5 MG PO CAPS: 12.5000 mg | ORAL_CAPSULE | Freq: Every day | ORAL | 3 refills | Status: DC

## 2018-04-21 NOTE — Telephone Encounter (Signed)
Per spouse, patient has been having angioedema on irbesartan-hctz per PCP - lip swelling only, responded to benadryl. She has been on this for quite some time - not new. PCP is recommending diltiazem 120mg  ER but preferred Dr. Martinique make med change.   Routed to MD/LPN

## 2018-04-21 NOTE — Telephone Encounter (Signed)
Spoke to patient's husband Dr.Jordan's recommendation given.HCTZ 12.5 mg prescription sent to pharmacy.

## 2018-04-21 NOTE — Telephone Encounter (Signed)
She can continue on HCTZ but I agree with changing irbesartan to Diltiazem ER  Verdene Creson Martinique MD, [facility]

## 2018-04-21 NOTE — Telephone Encounter (Signed)
New message:      Pt c/o medication issue:  1. Name of Medication: irbesartan-hydrochlorothiazide (AVALIDE) 300-12.5 MG tablet  2. How are you currently taking this medication (dosage and times per day)? Take 1 tablet by mouth daily.  3. Are you having a reaction (difficulty breathing--STAT)? No  4. What is your medication issue? Dr. Bubba Hales states his wife has developed a edema as a side effect of this medication.

## 2018-04-25 ENCOUNTER — Ambulatory Visit: Payer: Medicare Other | Admitting: Allergy

## 2018-04-25 ENCOUNTER — Encounter: Payer: Self-pay | Admitting: Allergy

## 2018-04-25 VITALS — BP 156/76 | HR 69 | Temp 97.9°F | Resp 16 | Ht 60.0 in | Wt 187.6 lb

## 2018-04-25 DIAGNOSIS — L508 Other urticaria: Secondary | ICD-10-CM | POA: Diagnosis not present

## 2018-04-25 DIAGNOSIS — J309 Allergic rhinitis, unspecified: Secondary | ICD-10-CM

## 2018-04-25 DIAGNOSIS — T783XXD Angioneurotic edema, subsequent encounter: Secondary | ICD-10-CM | POA: Diagnosis not present

## 2018-04-25 DIAGNOSIS — J4520 Mild intermittent asthma, uncomplicated: Secondary | ICD-10-CM

## 2018-04-25 DIAGNOSIS — L2089 Other atopic dermatitis: Secondary | ICD-10-CM

## 2018-04-25 DIAGNOSIS — H1010 Acute atopic conjunctivitis, unspecified eye: Secondary | ICD-10-CM

## 2018-04-25 MED ORDER — MONTELUKAST SODIUM 10 MG PO TABS: 10.0000 mg | ORAL_TABLET | Freq: Every day | ORAL | 5 refills | Status: DC

## 2018-04-25 NOTE — Progress Notes (Signed)
New Patient Note  RE: Janet [name] MRN: [id number] DOB: June 07, 1951 Date of Office Visit: 04/25/2018  Referring provider: Josetta Huddle, MD Primary care provider: Josetta Huddle, MD  Chief Complaint: swelling  History of present illness: Janet [name] is a 67 y.o. female presenting today for consultation for angioedema and urticaria.  She presents today with her husband who is a retired Engineer, drilling.  About 2 weeks ago she states she developed lip swelling that seemed to migrate around her lips.  She has had subsequent episodes of lip swelling.  She states she then started to develop issues with hives.  The hives this week have been on her trunk as well as on her hands.  she denies any tongue swelling or any difficulty breathing however she states about the past month she has had some difficulty swallowing and she feels that there is a lump in her throat that she can feel moving down her esophagus he states that it may rise back up over time.  She is unsure if she has had issues with reflux in the past but feels like she may have.  She has taken Zantac before and her husband states that he started taking Zantac again this week with the hives and the swelling episodes.  She states this week she was taking 1 tablet of Zantac a day.  She also states she did try taking loratadine once a day for about 5 days but then stopped and the symptoms returned.  She otherwise has been taking Benadryl for these episodes. The hives last less than 24 hours.  She has had no other associated swelling besides lip swelling.  Denies any fevers and no joint aches or pains with the rash the swelling also seems to resolve in 1 to 2 days.  There have been no identified exacerbating factors.  There is no identifying triggers.  She also states that she eats a lot of peanut butter almost daily and had been eating peanut butter on a regular basis prior to the onset of the swelling and hives.  She otherwise denies any new foods,  no new medications, no sting's and no change in soaps/lotions/detergents/body products.  She does states she was taking Avapro/hydrochlorothiazide and she did stop this on 04/20/2018 as they thought maybe this could be contributing to the swelling. About a month ago when the lump in the throat sensation develop she states she was eating sushi but she has since had shellfish and fish since the onset of this and this does not occur every time.     She has a history of brachial plexitis that she and her husband state was autoimmune mediated.  She does have a history of seasonal allergies mostly in the spring and with cat exposure.  She reports a lot of sneezing and watery eyes with cat exposure. She has history of eczema.  She will use use clobetasol as needed if she has a flare.  She states she has tried use of the clobetasol for the hives.  She also has a history of melanoma and follows with local dermatologist as well as [facility] dermatologist every 6 months. She has history of asthma.  She has albuterol that she has uses typically prior to exerczine or during respiratory illnesses primarily.  She is on Flovent that she takes 1 puff twice a day.  Denies any nighttime awakenings.  Denies any need for prednisone for flares at this time.  Her and her husband are both retired and  are traveling.  They are set to go abroad starting this Sunday with the expectation to return around [place].  They have another trip planned in November. Husband states he has prednisone at home and also has an Epipen.   Review of systems: Review of Systems  Constitutional: Negative for chills, fever and malaise/fatigue.  HENT: Negative for congestion, ear discharge, ear pain, nosebleeds, sinus pain and sore throat.   Eyes: Negative for pain, discharge and redness.  Respiratory: Negative for cough, shortness of breath and wheezing.   Cardiovascular: Negative for chest pain.  Gastrointestinal: Negative for abdominal pain,  constipation, diarrhea, heartburn, nausea and vomiting.  Musculoskeletal: Negative for joint pain.  Skin: Positive for itching and rash.  Neurological: Negative for headaches.    All other systems negative unless noted above in HPI  Past medical history: Past Medical History:  Diagnosis Date  . Abrasion of elbow, right 12/29/2015  . Anxiety   . Asthma    daily and prn inhalers  . Breast discharge    right bloody x 2 months  . High cholesterol    takes lipitor  . HOCM (hypertrophic obstructive cardiomyopathy) ([place])   . Hypertension    states under control with med., has been on med. x "years"  . Left bundle branch block (LBBB)   . Leg skin lesion, left    mole removed 12/27/2015  . Melanoma ([place])   . Nipple lesion 12/2015   right nipple mass  . Parsonage-Turner syndrome 02/02/2016   right  . PONV (postoperative nausea and vomiting)     Past surgical history: Past Surgical History:  Procedure Laterality Date  . APPENDECTOMY     age 67  . BREAST DUCTAL SYSTEM EXCISION Right 02/19/2017   Procedure: RIGHT BREAST CENTRAL DUCT EXCISION;  Surgeon: Erroll Luna, MD;  Location: [place];  Service: General;  Laterality: Right;  . BREAST EXCISIONAL BIOPSY    . CARDIAC SURGERY  03/09/2014   septal myectomy  . HYSTEROSCOPY W/D&C  07/21/2004   submucosal myomectomy  . INFERIOR OBLIQUE MYECTOMY  2015  . MASS EXCISION Right 01/04/2016   Procedure: EXCISION OF 1CM RIGHT NIPPLE MASS;  Surgeon: Erroll Luna, MD;  Location: [place];  Service: General;  Laterality: Right;  . MITRAL VALVE REPAIR  03/09/2014  . NAILBED REPAIR Right 10/14/2012   Procedure: BIOPSY NAIL MATRIX RIGHT THUMB;  Surgeon: Wynonia Sours, MD;  Location: [place];  Service: Orthopedics;  Laterality: Right;  . SALPINGOOPHORECTOMY  2017  . TEE WITHOUT CARDIOVERSION N/A 11/10/2013   Procedure: TRANSESOPHAGEAL ECHOCARDIOGRAM (TEE);  Surgeon: Dorothy Spark, MD;  Location: [facility]  ENDOSCOPY;  Service: Cardiovascular;  Laterality: N/A;    Family history:  Family History  Problem Relation Age of Onset  . Arrhythmia Father        A fib  . Hypertension Father   . Arrhythmia Brother        A fib  . Heart disease Brother        SBE, MV repair  . Breast cancer Maternal Grandmother   . Breast cancer Mother   . Hypertension Mother     Social history: Lives in a home with her husband with carpeting in the bedroom with electric heating and central cooling.  They have fish in the home and a cat outside the home.  There is no concern for water damage, mildew or roaches in the home.  She does report a smoking history of cigarettes with  a quit date in 1989.  Medication List: Allergies as of 04/25/2018      Reactions   Oxycontin [oxycodone] Hives   Pneumococcal Vaccines Swelling   Hydrocodone Nausea And Vomiting      Medication List        Accurate as of 04/25/18  2:00 PM. Always use your most recent med list.          albuterol 108 (90 Base) MCG/ACT inhaler Commonly known as:  PROVENTIL HFA;VENTOLIN HFA Inhale 2 puffs into the lungs every 6 (six) hours as needed for wheezing.   aspirin 81 MG tablet Take 1 tablet (81 mg total) by mouth daily.   atorvastatin 20 MG tablet Commonly known as:  LIPITOR Take 20 mg by mouth daily.   citalopram 40 MG tablet Commonly known as:  CELEXA Take 10 mg by mouth daily. Patient is triturating medication down now taking 30mg    Co Q-10 300 MG Caps Take 300 mg by mouth daily.   diltiazem 120 MG 24 hr capsule Commonly known as:  CARDIZEM CD Take 1 capsule (120 mg total) by mouth daily.   Fish Oil 1200 MG Caps Take 1 capsule by mouth daily.   FLOVENT HFA IN Inhale into the lungs.   HM VITAMIN D3 4000 units Caps Generic drug:  Cholecalciferol Take 2,000 Units by mouth daily.   hydrochlorothiazide 12.5 MG capsule Commonly known as:  MICROZIDE Take 1 capsule (12.5 mg total) by mouth daily.   montelukast 10 MG  tablet Commonly known as:  SINGULAIR Take 1 tablet (10 mg total) by mouth at bedtime.   RESTASIS 0.05 % ophthalmic emulsion Generic drug:  cycloSPORINE       Known medication allergies: Allergies  Allergen Reactions  . Oxycontin [Oxycodone] Hives  . Pneumococcal Vaccines Swelling  . Hydrocodone Nausea And Vomiting     Physical examination: Blood pressure (!) 156/76, pulse 69, temperature 97.9 F (36.6 C), temperature source Oral, resp. rate 16, height 5' (1.524 m), weight 187 lb 9.6 oz (85.1 kg), SpO2 94 %.  General: Alert, interactive, in no acute distress. HEENT: PERRLA, TMs pearly gray, turbinates minimally edematous without discharge, post-pharynx non erythematous. Neck: Supple without lymphadenopathy. Lungs: Clear to auscultation without wheezing, rhonchi or rales. {no increased work of breathing. CV: Normal S1, S2 without murmurs. Abdomen: Nondistended, nontender. Skin: Scattered erythematous urticarial type lesions primarily located Left hip , nonvesicular. Extremities:  No clubbing, cyanosis or edema. Neuro:   Grossly intact.  Diagnositics/Labs:  Spirometry: FEV1: 1.38L 71%, FVC: 2.05L 80%, ratio consistent with Mild obstructive pattern with a slight decrease in FEV1  Assessment and plan:   Angioedema with urticaria   - at this time etiology of hives and swelling is unknown and is acute (<6 wks in duration at this time).  Hives can be caused by a variety of different triggers including illness/infection, foods, medications, stings, exercise, pressure, vibrations, extremes of temperature to name a few however majority of the time there is no identifiable trigger and is idiopathic.  If symptoms become chronic  recommend obtaining following labs: CBC w diff, CMP, tryptase, hive panel, environmental panel, alpha-gal panel   - at this time for management take Loratadine 10mg  1 tablet twice a day with Pepcid 20mg  1 tablet twice a day and Singulair 10mg  1 tablet daily (at  bedtime)   - agree with having access to prednisone while abroad in case of acute outbreaks.  Recommend 30mg  twice a day x 2 day, 20mg  twice a day x 1  day, 10mg  twice a day x 1, 10mg  once a day x 1 day and stop.    - you have access to Epipen for as needed use in case of worsening/progressive symptoms however do not expect will need to use this  Asthma   - managed by Dr. Inda Merlin, PCP   - continue Flovent and as needed albuterol  Allergic rhinitis with conjunctivitis   - continue avoidance of cats   - as needed antihistamines as above  Eczema   - daily moisturization   - continue as needed use clobetasol  Follow-up 2-3 months or sooner if needed  I appreciate the opportunity to take part in Ia's care. Please do not hesitate to contact me with questions.  Sincerely,   Prudy Feeler, MD Allergy/Immunology [facility] [place]

## 2018-04-25 NOTE — Patient Instructions (Signed)
Angioedema with urticaria   - at this time etiology of hives and swelling is unknown and is acute (<6 wks in duration at this time).  Hives can be caused by a variety of different triggers including illness/infection, foods, medications, stings, exercise, pressure, vibrations, extremes of temperature to name a few however majority of the time there is no identifiable trigger and is idiopathic.  If symptoms become chronic this recommend obtaining following labs: CBC w diff, CMP, tryptase, hive panel, environmental panel, alpha-gal panel   - at this time for management take Loratadine 10mg  1 tablet twice a day with Pepcid 20mg  1 tablet twice a day and Singulair 10mg  1 tablet daily (at bedtime)   - agree with having access to prednisone while abroad in case of acute outbreaks.  Recommend 30mg  twice a day x 2 day, 20mg  twice a day x 1 day, 10mg  twice a day x 1, 10mg  once a day x 1 day and stop.    - you have access to Epipen for as needed use in case of worsening/progressive symptoms however do not expect will need to use this  Follow-up 2-3 months or sooner if needed

## 2018-04-28 ENCOUNTER — Telehealth: Payer: Self-pay | Admitting: Cardiology

## 2018-04-28 NOTE — Telephone Encounter (Signed)
Per the husband the patient developed angioedema from erbasartan-hctz so she was started on dilt and HCTZ. The husband states that her BP is not well controlled with these medications. Systolic is 568-127'N and diastolic is 17'G, pulse is 70's. Would like to know if we should increase one of the new medications.

## 2018-04-28 NOTE — Telephone Encounter (Signed)
° ° °  Patient's spouse calling with questions regarding meds, states patients blood pressure range is 150's/75. Wants to discuss diltiazem and hydrochlorothiazide

## 2018-04-28 NOTE — Telephone Encounter (Signed)
Spoke to patient's husband Dr.Jordan advised increase Diltiazem to 240 mg daily.Stated she has 90 day supply of 120 mg, she will take 2 tablets daily and call when she has finished for 240 mg tablet.

## 2018-05-13 ENCOUNTER — Telehealth: Payer: Self-pay | Admitting: Cardiology

## 2018-05-13 DIAGNOSIS — I421 Obstructive hypertrophic cardiomyopathy: Secondary | ICD-10-CM

## 2018-05-13 DIAGNOSIS — I10 Essential (primary) hypertension: Secondary | ICD-10-CM

## 2018-05-13 MED ORDER — SPIRONOLACTONE 25 MG PO TABS: 25.0000 mg | ORAL_TABLET | Freq: Every day | ORAL | 6 refills | Status: DC

## 2018-05-13 NOTE — Telephone Encounter (Signed)
Returned call to patient's husband.Dr.Jordan's recommendation given.He stated last time she took a beta blocker it interfered with asthma caused sob.Advised I will speak to [place].

## 2018-05-13 NOTE — Telephone Encounter (Signed)
New Message         Pt c/o BP issue: STAT if pt c/o blurred vision, one-sided weakness or slurred speech  1. What are your last 5 BP readings?      150 to 160 BP Pulse is mid 70  2. Are you having any other symptoms (ex. Dizziness, headache, blurred vision, passed out)? Headache  3. What is your BP issue? Blood running high still not controlled with medication.

## 2018-05-13 NOTE — Telephone Encounter (Signed)
I would recommend starting Bystolic 5 mg daily. See if we have samples to start.   Reya Aurich Martinique MD, [facility]

## 2018-05-13 NOTE — Telephone Encounter (Signed)
Spoke with patients husband and blood pressure showed no improvement with increasing the Diltiazem to 240 mg daily. He increased her to Diltiazem 240 mg in am and 120 mg in the evening about 1 week ago. No improvement blood pressure still running 150-160's/70' HR 70's. Will forward to Dr Martinique for review.

## 2018-05-13 NOTE — Telephone Encounter (Signed)
Returned call to patient's husband.Dr.Jordan advised Aldactone 25 mg daily.Bmet in 1 week.Lab order placed in lab box.

## 2018-05-20 LAB — BASIC METABOLIC PANEL
BUN / CREAT RATIO: 26 (ref 12–28)
BUN: 20 mg/dL (ref 8–27)
CHLORIDE: 98 mmol/L (ref 96–106)
CO2: 24 mmol/L (ref 20–29)
Calcium: 9.5 mg/dL (ref 8.7–10.3)
Creatinine, Ser: 0.76 mg/dL (ref 0.57–1.00)
GFR calc Af Amer: 93 mL/min/{1.73_m2} (ref >59)
GFR calc non Af Amer: 81 mL/min/{1.73_m2} (ref >59)
GLUCOSE: 108 mg/dL — AB (ref 65–99)
POTASSIUM: 4.5 mmol/L (ref 3.5–5.2)
SODIUM: 140 mmol/L (ref 134–144)

## 2018-05-22 ENCOUNTER — Telehealth: Payer: Self-pay | Admitting: Cardiology

## 2018-05-22 DIAGNOSIS — I421 Obstructive hypertrophic cardiomyopathy: Secondary | ICD-10-CM

## 2018-05-22 DIAGNOSIS — I10 Essential (primary) hypertension: Secondary | ICD-10-CM

## 2018-05-22 NOTE — Telephone Encounter (Signed)
Pt c/o BP issue: STAT if pt c/o blurred vision, one-sided weakness or slurred speech  1. What are your last 5 BP readings? 150/72   154/76   146/80  154/72  2. Are you having any other symptoms (ex. Dizziness, headache, blurred vision, passed out)? Mild headache   3. What is your BP issue? HIGH  Per Husband Dr. Bubba Hales.

## 2018-05-22 NOTE — Telephone Encounter (Signed)
Returned call to patient's husband.He stated wife's B/P still elevated.Readings listed below.Stated wife is taking Cardizem CD 240 mg in am and 120 mg pm,Aldactone 25 mg daily,HCTZ 12.5 mg daily.Message sent to [place] for advice.

## 2018-05-22 NOTE — Telephone Encounter (Signed)
Increase aldactone to 50 mg daily. Continue to monitor. Let us know BP in 2 weeks. Will need repeat BMET as well  Chiann Goffredo Martinique MD, [facility]

## 2018-05-23 MED ORDER — DILTIAZEM HCL ER COATED BEADS 120 MG PO CP24: ORAL_CAPSULE | ORAL | 3 refills | Status: DC

## 2018-05-23 NOTE — Telephone Encounter (Signed)
Returned call to patient's husband Dr.Jordan's recommendation given.Advised to continue to monitor B/P and call back in 2 weeks to report reading.Advised to have a bmet in 2 weeks.Lab order mailed.

## 2018-06-04 NOTE — Progress Notes (Signed)
Janet [name] Date of Birth: Mar 15, 1951 Medical Record #[id number]  History of Present Illness: Janet [name] is seen for follow HOCM. She is  s/p septal myectomy and MV repair at [facility] clinic on March 09, 2014. This was performed for HOCM with severe LV outflow gradient as well as MV prolapse with severe posterior MV prolapse and severe MR. She had extended septal myectomy with triangular resection of the posterior MV leaflet with a C shaped annuloplasty ring. She did very well in the post op period. No Afib. She did develop a post op LBBB.  Follow up Echo in December 2015 showed no LVOT gradient and only trivial MR. Repeat Echo in July 2017 was stable.  More recently she has noted increase in BP. She developed angioedema and urticaria on ARB and this was discontinued. Diltiazem dose increased. BP still elevated and was started on aldactone- dose increased to 50 mg daily. Reports some reactive airway disease on beta blocker in the past even with selective beta blocker. With change in meds she has noted improvement in BP. Complains of constipation and HA. Mild dizziness.   She is exercising 3-4 days/week at the gym.   She denies any palpitations, chest pain, edema.   Current Outpatient Medications on File Prior to Visit  Medication Sig Dispense Refill  . albuterol (PROVENTIL HFA;VENTOLIN HFA) 108 (90 BASE) MCG/ACT inhaler Inhale 2 puffs into the lungs every 6 (six) hours as needed for wheezing.    Marland Kitchen aspirin 81 MG tablet Take 1 tablet (81 mg total) by mouth daily. 30 tablet   . atorvastatin (LIPITOR) 20 MG tablet Take 20 mg by mouth daily.    . Cholecalciferol (HM VITAMIN D3) 4000 UNITS CAPS Take 2,000 Units by mouth daily.     . Coenzyme Q10 (CO Q-10) 300 MG CAPS Take 300 mg by mouth daily.    . Fluticasone Propionate HFA (FLOVENT HFA IN) Inhale into the lungs.    . hydrochlorothiazide (MICROZIDE) 12.5 MG capsule Take 1 capsule (12.5 mg total) by mouth daily. 90 capsule 3  . montelukast (SINGULAIR)  10 MG tablet Take 1 tablet (10 mg total) by mouth at bedtime. 30 tablet 5  . Omega-3 Fatty Acids (FISH OIL) 1200 MG CAPS Take 1 capsule by mouth daily.    . RESTASIS 0.05 % ophthalmic emulsion      No current facility-administered medications on file prior to visit.     Allergies  Allergen Reactions  . Oxycontin [Oxycodone] Hives  . Pneumococcal Vaccines Swelling  . Hydrocodone Nausea And Vomiting    Past Medical History:  Diagnosis Date  . Abrasion of elbow, right 12/29/2015  . Anxiety   . Asthma    daily and prn inhalers  . Breast discharge    right bloody x 2 months  . High cholesterol    takes lipitor  . HOCM (hypertrophic obstructive cardiomyopathy) ([place])   . Hypertension    states under control with med., has been on med. x "years"  . Left bundle branch block (LBBB)   . Leg skin lesion, left    mole removed 12/27/2015  . Melanoma ([place])   . Nipple lesion 12/2015   right nipple mass  . [place]-Turner syndrome 02/02/2016   right  . PONV (postoperative nausea and vomiting)     Past Surgical History:  Procedure Laterality Date  . APPENDECTOMY     age 62  . BREAST DUCTAL SYSTEM EXCISION Right 02/19/2017   Procedure: RIGHT BREAST CENTRAL DUCT EXCISION;  Surgeon:  Erroll Luna, MD;  Location: [place];  Service: General;  Laterality: Right;  . BREAST EXCISIONAL BIOPSY    . CARDIAC SURGERY  03/09/2014   septal myectomy  . HYSTEROSCOPY W/D&C  07/21/2004   submucosal myomectomy  . INFERIOR OBLIQUE MYECTOMY  2015  . MASS EXCISION Right 01/04/2016   Procedure: EXCISION OF 1CM RIGHT NIPPLE MASS;  Surgeon: Erroll Luna, MD;  Location: [place];  Service: General;  Laterality: Right;  . MITRAL VALVE REPAIR  03/09/2014  . NAILBED REPAIR Right 10/14/2012   Procedure: BIOPSY NAIL MATRIX RIGHT THUMB;  Surgeon: Wynonia Sours, MD;  Location: [place];  Service: Orthopedics;  Laterality: Right;  . SALPINGOOPHORECTOMY  2017  . TEE  WITHOUT CARDIOVERSION N/A 11/10/2013   Procedure: TRANSESOPHAGEAL ECHOCARDIOGRAM (TEE);  Surgeon: Dorothy Spark, MD;  Location: [facility] ENDOSCOPY;  Service: Cardiovascular;  Laterality: N/A;    Social History   Tobacco Use  Smoking Status Former Smoker  . Last attempt to quit: 07/08/1986  . Years since quitting: 31.9  Smokeless Tobacco Never Used    Social History   Substance and Sexual Activity  Alcohol Use No    Family History  Problem Relation Age of Onset  . Arrhythmia Father        A fib  . Hypertension Father   . Arrhythmia Brother        A fib  . Heart disease Brother        SBE, MV repair  . Breast cancer Maternal Grandmother   . Breast cancer Mother   . Hypertension Mother     Review of Systems: As noted in HPI.  All other systems were reviewed and are negative.  Physical Exam: BP 135/74   Pulse 92   Ht 5' (1.524 m)   Wt 182 lb 3.2 oz (82.6 kg)   BMI 35.58 kg/m   GENERAL:  Well appearing WF in NAD HEENT:  PERRL, EOMI, sclera are clear. Oropharynx is clear. NECK:  No jugular venous distention, carotid upstroke brisk and symmetric, no bruits, no thyromegaly or adenopathy LUNGS:  Clear to auscultation bilaterally CHEST:  Unremarkable HEART:  RRR,  PMI not displaced or sustained,S1 and S2 within normal limits, no S3, no S4: no clicks, no rubs, no murmurs ABD:  Soft, nontender. BS +, no masses or bruits. No hepatomegaly, no splenomegaly EXT:  2 + pulses throughout, no edema, no cyanosis no clubbing SKIN:  Warm and dry.  No rashes NEURO:  Alert and oriented x 3. Cranial nerves II through XII intact. PSYCH:  Cognitively intact    LABORATORY DATA:  Lab Results  Component Value Date   HGB 12.8 10/14/2012   GLUCOSE 108 (H) 05/20/2018   NA 140 05/20/2018   K 4.5 05/20/2018   CL 98 05/20/2018   CREATININE 0.76 05/20/2018   BUN 20 05/20/2018   CO2 24 05/20/2018   HGBA1C 5.8 (H) 02/02/2016   Labs dated 11/03/15: cholesterol 159, triglycerides 155, LDL 78,  HDL 50.  Dated 11/23/16: cholesterol 156, triglycerides 158, HDL 51, LDL 74. A1c 6%.  Dated 12/17/17: cholesterol 161, triglycerides 146, HDL 49, LDL 83. A1c 6.2%. Hgb 13.6. ALT and TSH normal.  Ecg today shows NSR with first degree AV block. LAD, LBBB.  I have personally reviewed and interpreted this study.   Echo 01/31/16: Study Conclusions  - Left ventricle: The cavity size was normal. There was mild   concentric hypertrophy. Systolic function was normal. The  estimated ejection fraction was in the range of 60% to 65%. No   VSD. Wall motion was normal; there were no regional wall motion   abnormalities. Doppler parameters are consistent with abnormal   left ventricular relaxation (grade 1 diastolic dysfunction). - Mitral valve: Post mitral valve repair post myomectomy. Mobility   was restricted. - Left atrium: The atrium was mildly dilated. - Pulmonic valve: There was moderate regurgitation. - Pulmonary arteries: Systolic pressure was mildly increased. PA   peak pressure: 33 mm Hg (S).   Assessment / Plan: 1. MV prolapse with severe mitral insufficiency.Now s/p posterior MV leaflet repair. Trivial residual insufficiency by prior Echo. No murmur on exam today. Continue ASA daily.  2. HOCM s/p septal myectomy. No residual gradient.   3. PVCs- resolved post surgery.   4.  HTN - recent increase since she had to come off of ARB due to angioedema. Now on aldactone and higher Cardizem dose with improvement. Next option would be Cardura. Will monitor. Check labs today on aldactone  5. LBBB. Persistent by Ecg June 2017.   Will plan follow up in 6 months.

## 2018-06-09 ENCOUNTER — Ambulatory Visit: Payer: Medicare Other | Admitting: Cardiology

## 2018-06-09 ENCOUNTER — Encounter: Payer: Self-pay | Admitting: Cardiology

## 2018-06-09 VITALS — BP 135/74 | HR 92 | Ht 60.0 in | Wt 182.2 lb

## 2018-06-09 DIAGNOSIS — I421 Obstructive hypertrophic cardiomyopathy: Secondary | ICD-10-CM

## 2018-06-09 DIAGNOSIS — Z9889 Other specified postprocedural states: Secondary | ICD-10-CM | POA: Diagnosis not present

## 2018-06-09 DIAGNOSIS — I447 Left bundle-branch block, unspecified: Secondary | ICD-10-CM | POA: Diagnosis not present

## 2018-06-09 DIAGNOSIS — I10 Essential (primary) hypertension: Secondary | ICD-10-CM | POA: Diagnosis not present

## 2018-06-09 LAB — BASIC METABOLIC PANEL
BUN/Creatinine Ratio: 19 (ref 12–28)
BUN: 15 mg/dL (ref 8–27)
CALCIUM: 9.7 mg/dL (ref 8.7–10.3)
CO2: 25 mmol/L (ref 20–29)
CREATININE: 0.78 mg/dL (ref 0.57–1.00)
Chloride: 96 mmol/L (ref 96–106)
GFR calc Af Amer: 90 mL/min/{1.73_m2} (ref >59)
GFR calc non Af Amer: 78 mL/min/{1.73_m2} (ref >59)
GLUCOSE: 119 mg/dL — AB (ref 65–99)
Potassium: 4.7 mmol/L (ref 3.5–5.2)
Sodium: 138 mmol/L (ref 134–144)

## 2018-06-09 MED ORDER — SPIRONOLACTONE 50 MG PO TABS: 50.0000 mg | ORAL_TABLET | Freq: Every day | ORAL | 3 refills | Status: DC

## 2018-06-09 MED ORDER — DILTIAZEM HCL ER COATED BEADS 360 MG PO CP24: 360.0000 mg | ORAL_CAPSULE | Freq: Every day | ORAL | 3 refills | Status: DC

## 2018-06-09 NOTE — Patient Instructions (Signed)
We will switch diltiazem to Cardizem CD 360 mg daily  Continue your other therapy

## 2018-06-10 ENCOUNTER — Other Ambulatory Visit: Payer: Self-pay

## 2018-06-10 MED ORDER — SPIRONOLACTONE 50 MG PO TABS: 50.0000 mg | ORAL_TABLET | Freq: Every day | ORAL | 3 refills | Status: DC

## 2018-06-10 NOTE — Telephone Encounter (Signed)
Received error from pharmacy that medication refill did not go through; resending rx to pharmacy.

## 2018-07-25 ENCOUNTER — Encounter: Payer: Self-pay | Admitting: Allergy

## 2018-07-25 ENCOUNTER — Ambulatory Visit: Payer: Medicare Other | Admitting: Allergy

## 2018-07-25 VITALS — BP 152/70 | HR 98 | Ht 60.3 in | Wt 183.6 lb

## 2018-07-25 DIAGNOSIS — J4520 Mild intermittent asthma, uncomplicated: Secondary | ICD-10-CM | POA: Diagnosis not present

## 2018-07-25 DIAGNOSIS — R030 Elevated blood-pressure reading, without diagnosis of hypertension: Secondary | ICD-10-CM | POA: Diagnosis not present

## 2018-07-25 DIAGNOSIS — T783XXD Angioneurotic edema, subsequent encounter: Secondary | ICD-10-CM | POA: Diagnosis not present

## 2018-07-25 DIAGNOSIS — L508 Other urticaria: Secondary | ICD-10-CM

## 2018-07-25 NOTE — Progress Notes (Signed)
Follow-up Note  RE: Janet [name] MRN: [id number] DOB: 1951-05-30 Date of Office Visit: 07/25/2018   History of present illness: Janet [name] is a 68 y.o. female presenting today for follow-up of urticaria with angioedema.  She presents today with her husband.  She was last seen in the office on April 25, 2018 for her initial visit.  At this visit I recommended that she start the following high-dose antihistamine regimen: Loratadine twice a day, Pepcid twice a day and singular daily.  She states she has been taking all of his medications and has not had any further episodes of hives or swelling.  They are able to go on their vacation as planned without any problems.  She did not need to use the prednisone pack.  She also did not need to use her epinephrine device which she previously had.  She states she is so grateful that she has been doing well and would like to come off of the medications if possible. In regards to her asthma history she continues to follow with her PCP for this.  She states she has not noticed any significant improvement in her symptoms while she has been on Singulair.  Singulair did run out earlier this week and she has not noted any worsening hives or swelling or any worsening asthma.  She continues to take her Flovent and as needed albuterol.  She states she rarely needs to use albuterol. She states since she has been on Pepcid twice that she has not had any reflux symptoms at all and wonders if she should potentially stay on the Pepcid or if she should try to wean off of this.  Review of systems: Review of Systems  Constitutional: Negative for chills, fever and malaise/fatigue.  HENT: Negative for congestion, ear discharge, nosebleeds and sore throat.   Eyes: Negative for pain, discharge and redness.  Respiratory: Negative for cough, shortness of breath and wheezing.   Cardiovascular: Negative for chest pain.  Gastrointestinal: Negative for abdominal pain,  constipation, diarrhea, heartburn, nausea and vomiting.  Musculoskeletal: Negative for joint pain.  Skin: Negative for itching and rash.  Neurological: Negative for headaches.    All other systems negative unless noted above in HPI  Past medical/social/surgical/family history have been reviewed and are unchanged unless specifically indicated below.  No changes  Medication List: Allergies as of 07/25/2018      Reactions   Oxycontin [oxycodone] Hives   Pneumococcal Vaccines Swelling   Hydrocodone Nausea And Vomiting      Medication List       Accurate as of July 25, 2018  1:31 PM. Always use your most recent med list.        albuterol 108 (90 Base) MCG/ACT inhaler Commonly known as:  PROVENTIL HFA;VENTOLIN HFA Inhale 2 puffs into the lungs every 6 (six) hours as needed for wheezing.   aspirin 81 MG tablet Take 1 tablet (81 mg total) by mouth daily.   atorvastatin 20 MG tablet Commonly known as:  LIPITOR Take 20 mg by mouth daily.   Co Q-10 300 MG Caps Take 300 mg by mouth daily.   diltiazem 360 MG 24 hr capsule Commonly known as:  CARDIZEM CD Take 1 capsule (360 mg total) by mouth daily.   famotidine 20 MG tablet Commonly known as:  PEPCID Take 20 mg by mouth 2 (two) times daily.   Fish Oil 1200 MG Caps Take 1 capsule by mouth daily.   FLOVENT HFA IN Inhale into  the lungs.   HM VITAMIN D3 100 MCG (4000 UT) Caps Generic drug:  Cholecalciferol Take 2,000 Units by mouth daily.   hydrochlorothiazide 12.5 MG capsule Commonly known as:  MICROZIDE Take 1 capsule (12.5 mg total) by mouth daily.   loratadine 10 MG tablet Commonly known as:  CLARITIN Take 10 mg by mouth daily as needed for allergies.   montelukast 10 MG tablet Commonly known as:  SINGULAIR Take 1 tablet (10 mg total) by mouth at bedtime.   RESTASIS 0.05 % ophthalmic emulsion Generic drug:  cycloSPORINE   spironolactone 50 MG tablet Commonly known as:  ALDACTONE Take 1 tablet (50 mg  total) by mouth daily.       Known medication allergies: Allergies  Allergen Reactions  . Oxycontin [Oxycodone] Hives  . Pneumococcal Vaccines Swelling  . Hydrocodone Nausea And Vomiting     Physical examination: Blood pressure (!) 152/70, pulse 98, height 5' 0.3" (1.532 m), weight 183 lb 9.6 oz (83.3 kg), SpO2 95 %.  General: Alert, interactive, in no acute distress. HEENT: PERRLA, TMs pearly gray, turbinates non-edematous without discharge, post-pharynx non erythematous. Neck: Supple without lymphadenopathy. Lungs: Clear to auscultation without wheezing, rhonchi or rales. {no increased work of breathing. CV: Normal S1, S2 without murmurs. Abdomen: Nondistended, nontender. Skin: Warm and dry, without lesions or rashes. Extremities:  No clubbing, cyanosis or edema. Neuro:   Grossly intact.  Diagnositics/Labs:  Spirometry: FEV1: 1.49L 76%, FVC: 2.47L 96%, ratio consistent with Nonobstructive pattern for age  Assessment and plan:   Angioedema with urticaria  - Hives and swelling can be caused by a variety of different triggers including illness/infection, foods, medications, stings, exercise, pressure, vibrations, extremes of temperature to name a few however majority of the time there is no identifiable trigger and is idiopathic.  Symptoms improved/resolved with high-dose antihistamine regimen.   If symptoms return and become chronic (lasting >6 weeks) would recommend obtaining following labs: CBC w diff, CMP, tryptase, hive panel, environmental panel, alpha-gal panel   - start weaning medications as follows: off Singulair now (will not refill at this time).  Monday stop evening dose of Loratadine.  If symptoms do not return after 3-5 days then stop morning dose of Loratadine.  If symptoms do not return after 3-5 days then stop evening dose of Pepcid.  If symptoms do not return after 3-5 days then stop Pepcid.      - if you have return of symptoms at any point in the wean then  resume the last dose removed and continue regimen.      - you can stay on Pepcid 1-2 times a day for reflux control if needed.      - let us know if symptoms return during the wean.       - if able to completely come off everything and if symptoms return in the future will plan to resume the high-dose antihistamine regimen.      - you have access to Epipen for as needed use in case of worsening/progressive symptoms however do not expect will need to use this  Mild intermittent asthma  -Followed by her PCP  -Stable and good control with Flovent and as needed albuterol.  -I do not feel that he had any improvement in her symptoms while on Singulair.  Her lung function today is better however I do not know that I can contribute this to the use of Singulair.  Advised today that if she has worsening asthma symptoms now that she is  off Singulair and let us know and we can refill this medication for additional asthma control.  Elevated BP    - continue to monitor BP in ambulatory setting  Follow-up as needed  I appreciate the opportunity to take part in Shanequia's care. Please do not hesitate to contact me with questions.  Sincerely,   Prudy Feeler, MD Allergy/Immunology [facility]

## 2018-07-25 NOTE — Addendum Note (Signed)
Addended by: Farrel Demark R on: 07/25/2018 04:02 PM   Modules accepted: Orders

## 2018-07-25 NOTE — Patient Instructions (Addendum)
Angioedema with urticaria  - Hives and swelling can be caused by a variety of different triggers including illness/infection, foods, medications, stings, exercise, pressure, vibrations, extremes of temperature to name a few however majority of the time there is no identifiable trigger and is idiopathic.  Symptoms improved/resolved with high-dose antihistamine regimen as below.   If symptoms return and become chronic (lasting >6 weeks) would recommend obtaining following labs: CBC w diff, CMP, tryptase, hive panel, environmental panel, alpha-gal panel   - start weaning medications as follows: off Singulair now (will not refill at this time).  Monday stop evening dose of Loratadine.  If symptoms do not return after 3-5 days then stop morning dose of Loratadine.  If symptoms do not return after 3-5 days then stop evening dose of Pepcid.  If symptoms do not return after 3-5 days then stop Pepcid.      - if you have return of symptoms at any point in the wean then resume the last dose removed and continue regimen.      - you can stay on Pepcid 1-2 times a day for reflux control if needed.      - let us know if symptoms return during the wean.       - if able to completely come off everything and if symptoms return in the future will plan to resume the high-dose antihistamine regimen.      - you have access to Epipen for as needed use in case of worsening/progressive symptoms however do not expect will need to use this  Elevated BP    - continue to monitor BP in ambulatory setting  Follow-up as needed

## 2018-09-25 ENCOUNTER — Other Ambulatory Visit: Payer: Self-pay | Admitting: Cardiology

## 2018-09-25 MED ORDER — SPIRONOLACTONE 50 MG PO TABS: 50.0000 mg | ORAL_TABLET | Freq: Every day | ORAL | 2 refills | Status: DC

## 2018-09-25 NOTE — Telephone Encounter (Signed)
 *  STAT* If patient is at the pharmacy, call can be transferred to refill team.   1. Which medications need to be refilled? (please list name of each medication and dose if known) spironolactone (ALDACTONE) 50 MG tablet  2. Which pharmacy/location (including street and city if local pharmacy) is medication to be sent to? [facility]  3. Do they need a 30 day or 90 day supply? [place]

## 2018-10-21 ENCOUNTER — Telehealth: Payer: Self-pay

## 2018-10-21 MED ORDER — MONTELUKAST SODIUM 10 MG PO TABS: 10.0000 mg | ORAL_TABLET | Freq: Every day | ORAL | 0 refills | Status: DC

## 2018-10-21 NOTE — Telephone Encounter (Signed)
Refill sent to requested pharmacy. Janet [name] is out of the office today, but according to her last office note, she stated that if patient had worsening asthma symptoms then we could refill this medication to help regain better asthma control.

## 2018-10-21 NOTE — Telephone Encounter (Signed)
Patient called and would like to start back on singulair. She would like a 90 Day refill.  Thanks   [facility] on [address]

## 2018-12-04 ENCOUNTER — Telehealth: Payer: Self-pay | Admitting: Cardiology

## 2018-12-04 NOTE — Telephone Encounter (Signed)
LVM for pt to call regarding video or phone visit with Dr. Martinique.

## 2018-12-04 NOTE — Telephone Encounter (Signed)
° ° °  Please return call to patient °

## 2018-12-04 NOTE — Telephone Encounter (Signed)
LVM to let us know if she wants a phone or video visit.

## 2018-12-09 ENCOUNTER — Telehealth: Payer: Medicare Other | Admitting: Cardiology

## 2019-01-15 ENCOUNTER — Other Ambulatory Visit: Payer: Self-pay | Admitting: Obstetrics & Gynecology

## 2019-01-15 DIAGNOSIS — Z1231 Encounter for screening mammogram for malignant neoplasm of breast: Secondary | ICD-10-CM

## 2019-01-16 ENCOUNTER — Other Ambulatory Visit: Payer: Self-pay

## 2019-01-16 MED ORDER — MONTELUKAST SODIUM 10 MG PO TABS: 10.0000 mg | ORAL_TABLET | Freq: Every day | ORAL | 0 refills | Status: AC

## 2019-02-27 ENCOUNTER — Ambulatory Visit: Payer: Medicare Other

## 2019-03-03 ENCOUNTER — Other Ambulatory Visit: Payer: Self-pay

## 2019-03-03 ENCOUNTER — Ambulatory Visit
Admission: RE | Admit: 2019-03-03 | Discharge: 2019-03-03 | Disposition: A | Discharge status: Home / Self Care | Payer: Medicare Other | Source: Ambulatory Visit | Attending: Obstetrics & Gynecology | Admitting: Obstetrics & Gynecology

## 2019-03-03 DIAGNOSIS — Z1231 Encounter for screening mammogram for malignant neoplasm of breast: Secondary | ICD-10-CM

## 2019-03-12 ENCOUNTER — Other Ambulatory Visit: Payer: Self-pay | Admitting: Internal Medicine

## 2019-03-12 ENCOUNTER — Ambulatory Visit
Admission: RE | Admit: 2019-03-12 | Discharge: 2019-03-12 | Disposition: A | Discharge status: Home / Self Care | Payer: Medicare Other | Source: Ambulatory Visit | Attending: Internal Medicine | Admitting: Internal Medicine

## 2019-03-12 DIAGNOSIS — Z85820 Personal history of malignant melanoma of skin: Secondary | ICD-10-CM

## 2019-03-26 ENCOUNTER — Other Ambulatory Visit: Payer: Self-pay | Admitting: Cardiology

## 2019-04-13 ENCOUNTER — Ambulatory Visit: Payer: Medicare Other

## 2019-04-15 ENCOUNTER — Telehealth: Payer: Self-pay

## 2019-04-15 NOTE — Telephone Encounter (Signed)
Received fax for refill for Montelukast. Patient was last seen 07/2018 and will need an OV. Refill denied.

## 2019-06-11 ENCOUNTER — Telehealth: Payer: Self-pay | Admitting: Cardiology

## 2019-06-11 MED ORDER — SPIRONOLACTONE 50 MG PO TABS: 50.0000 mg | ORAL_TABLET | Freq: Every day | ORAL | 0 refills | Status: DC

## 2019-06-11 MED ORDER — DILTIAZEM HCL ER COATED BEADS 360 MG PO CP24: 360.0000 mg | ORAL_CAPSULE | Freq: Every day | ORAL | 0 refills | Status: DC

## 2019-06-11 NOTE — Telephone Encounter (Signed)
Requested Prescriptions   Signed Prescriptions Disp Refills  . spironolactone (ALDACTONE) 50 MG tablet 90 tablet 0    Sig: Take 1 tablet (50 mg total) by mouth daily.    Authorizing Provider: Martinique, PETER M    Ordering User: Raelene Bott, Sylvi Rybolt L  . diltiazem (CARDIZEM CD) 360 MG 24 hr capsule 90 capsule 0    Sig: Take 1 capsule (360 mg total) by mouth daily.    Authorizing Provider: Martinique, PETER M    Ordering User: Raelene Bott, Hulbert Branscome L

## 2019-06-11 NOTE — Telephone Encounter (Signed)
New Message      *STAT* If patient is at the pharmacy, call can be transferred to refill team.   1. Which medications need to be refilled? (please list name of each medication and dose if known) spironolactone (ALDACTONE) 50 MG tablet  diltiazem (CARDIZEM CD) 360 MG 24 hr capsule  2. Which pharmacy/location (including street and city if local pharmacy) is medication to be sent to? [place], [place] - [address] AT [place]  3. Do they need a 30 day or 90 day supply? 90  Pt needs medication by tomorrow

## 2019-08-15 NOTE — Progress Notes (Signed)
Janet [name] Date of Birth: 03-Feb-1951 Medical Record [id number]  History of Present Illness: Janet [name] is seen for follow HOCM. She is  s/p septal myectomy and MV repair at [facility] clinic on March 09, 2014. This was performed for HOCM with severe LV outflow gradient as well as MV prolapse with severe posterior MV prolapse and severe MR. She had extended septal myectomy with triangular resection of the posterior MV leaflet with a C shaped annuloplasty ring. She did very well in the post op period. No Afib. She did develop a post op LBBB.  Follow up Echo in December 2015 showed no LVOT gradient and only trivial MR. Repeat Echo in July 2017 was stable.  On prior visit she has noted increase in BP. She developed angioedema and urticaria on ARB and this was discontinued. Diltiazem dose increased. BP still elevated and was started on aldactone- dose increased to 50 mg daily. Reports some reactive airway disease on beta blocker in the past even with selective beta blocker. With change in meds she has noted improvement in BP.   She is exercising regularly and has lost 15 lbs over the past year.   She  chest pain, SOB, or edema. She has noted a few skipped beats. No dizziness.   Current Outpatient Medications on File Prior to Visit  Medication Sig Dispense Refill  . albuterol (PROVENTIL HFA;VENTOLIN HFA) 108 (90 BASE) MCG/ACT inhaler Inhale 2 puffs into the lungs every 6 (six) hours as needed for wheezing.    Marland Kitchen aspirin 81 MG tablet Take 1 tablet (81 mg total) by mouth daily. 30 tablet   . atorvastatin (LIPITOR) 20 MG tablet Take 20 mg by mouth daily.    . Cholecalciferol (HM VITAMIN D3) 4000 UNITS CAPS Take 2,000 Units by mouth daily.     . Coenzyme Q10 (CO Q-10) 300 MG CAPS Take 300 mg by mouth daily.    Marland Kitchen diltiazem (CARDIZEM CD) 360 MG 24 hr capsule Take 1 capsule (360 mg total) by mouth daily. 90 capsule 0  . famotidine (PEPCID) 20 MG tablet Take 20 mg by mouth 2 (two) times daily.    . Fluticasone  Propionate HFA (FLOVENT HFA IN) Inhale into the lungs.    . hydrochlorothiazide (MICROZIDE) 12.5 MG capsule TAKE 1 CAPSULE(12.5 MG) BY MOUTH DAILY 90 capsule 1  . loratadine (CLARITIN) 10 MG tablet Take 10 mg by mouth daily as needed for allergies.    . montelukast (SINGULAIR) 10 MG tablet Take 1 tablet (10 mg total) by mouth at bedtime. 90 tablet 0  . Omega-3 Fatty Acids (FISH OIL) 1200 MG CAPS Take 1 capsule by mouth daily.    Marland Kitchen spironolactone (ALDACTONE) 50 MG tablet Take 1 tablet (50 mg total) by mouth daily. 90 tablet 0  . RESTASIS 0.05 % ophthalmic emulsion      No current facility-administered medications on file prior to visit.    Allergies  Allergen Reactions  . Oxycontin [Oxycodone] Hives  . Pneumococcal Vaccines Swelling  . Hydrocodone Nausea And Vomiting    Past Medical History:  Diagnosis Date  . Abrasion of elbow, right 12/29/2015  . Anxiety   . Asthma    daily and prn inhalers  . Breast discharge    right bloody x 2 months  . High cholesterol    takes lipitor  . HOCM (hypertrophic obstructive cardiomyopathy) ([place])   . Hypertension    states under control with med., has been on med. x "years"  . Left bundle branch  block (LBBB)   . Leg skin lesion, left    mole removed 12/27/2015  . Melanoma ([place])   . Nipple lesion 12/2015   right nipple mass  . Parsonage-Turner syndrome 02/02/2016   right  . PONV (postoperative nausea and vomiting)     Past Surgical History:  Procedure Laterality Date  . APPENDECTOMY     age 57  . BREAST DUCTAL SYSTEM EXCISION Right 02/19/2017   Procedure: RIGHT BREAST CENTRAL DUCT EXCISION;  Surgeon: Erroll Luna, MD;  Location: [place];  Service: General;  Laterality: Right;  . BREAST EXCISIONAL BIOPSY Right 2017  . CARDIAC SURGERY  03/09/2014   septal myectomy  . HYSTEROSCOPY WITH D & C  07/21/2004   submucosal myomectomy  . INFERIOR OBLIQUE MYECTOMY  2015  . MASS EXCISION Right 01/04/2016   Procedure: EXCISION OF 1CM  RIGHT NIPPLE MASS;  Surgeon: Erroll Luna, MD;  Location: [place];  Service: General;  Laterality: Right;  . MITRAL VALVE REPAIR  03/09/2014  . NAILBED REPAIR Right 10/14/2012   Procedure: BIOPSY NAIL MATRIX RIGHT THUMB;  Surgeon: Wynonia Sours, MD;  Location: [place];  Service: Orthopedics;  Laterality: Right;  . SALPINGOOPHORECTOMY  2017  . TEE WITHOUT CARDIOVERSION N/A 11/10/2013   Procedure: TRANSESOPHAGEAL ECHOCARDIOGRAM (TEE);  Surgeon: Dorothy Spark, MD;  Location: [facility] ENDOSCOPY;  Service: Cardiovascular;  Laterality: N/A;    Social History   Tobacco Use  Smoking Status Former Smoker  . Quit date: 07/08/1986  . Years since quitting: 33.1  Smokeless Tobacco Never Used    Social History   Substance and Sexual Activity  Alcohol Use No    Family History  Problem Relation Age of Onset  . Arrhythmia Father        A fib  . Hypertension Father   . Arrhythmia Brother        A fib  . Heart disease Brother        SBE, MV repair  . Breast cancer Maternal Grandmother   . Breast cancer Mother   . Hypertension Mother     Review of Systems: As noted in HPI.  All other systems were reviewed and are negative.  Physical Exam: BP 138/80   Pulse 70   Temp (!) 96.4 F (35.8 C)   Ht 5\' 1"  (1.549 m)   Wt 167 lb 12.8 oz (76.1 kg)   SpO2 96%   BMI 31.71 kg/m   GENERAL:  Well appearing WF in NAD HEENT:  PERRL, EOMI, sclera are clear. Oropharynx is clear. NECK:  No jugular venous distention, carotid upstroke brisk and symmetric, no bruits, no thyromegaly or adenopathy LUNGS:  Clear to auscultation bilaterally CHEST:  Unremarkable HEART:  RRR,  PMI not displaced or sustained,S1 and S2 within normal limits, no S3, no S4: no clicks, no rubs, no murmurs ABD:  Soft, nontender. BS +, no masses or bruits. No hepatomegaly, no splenomegaly EXT:  2 + pulses throughout, no edema, no cyanosis no clubbing SKIN:  Warm and dry.  No rashes NEURO:  Alert and  oriented x 3. Cranial nerves II through XII intact. PSYCH:  Cognitively intact    LABORATORY DATA:  Lab Results  Component Value Date   HGB 12.8 10/14/2012   GLUCOSE 119 (H) 06/09/2018   NA 138 06/09/2018   K 4.7 06/09/2018   CL 96 06/09/2018   CREATININE 0.78 06/09/2018   BUN 15 06/09/2018   CO2 25 06/09/2018   HGBA1C 5.8 (  H) 02/02/2016   Labs dated 11/03/15: cholesterol 159, triglycerides 155, LDL 78, HDL 50.  Dated 11/23/16: cholesterol 156, triglycerides 158, HDL 51, LDL 74. A1c 6%.  Dated 12/17/17: cholesterol 161, triglycerides 146, HDL 49, LDL 83. A1c 6.2%. Hgb 13.6. ALT and TSH normal. Dated 03/12/19: cholesterol 160, triglycerides 152, HDL 51, LDL 78. CMET, CBC, TSH normal.  Dated 04/27/19: A1c 6.2%   Ecg today shows NSR with first degree AV block. rate 70. LAD, LBBB.  I have personally reviewed and interpreted this study.   Echo 01/31/16: Study Conclusions  - Left ventricle: The cavity size was normal. There was mild   concentric hypertrophy. Systolic function was normal. The   estimated ejection fraction was in the range of 60% to 65%. No   VSD. Wall motion was normal; there were no regional wall motion   abnormalities. Doppler parameters are consistent with abnormal   left ventricular relaxation (grade 1 diastolic dysfunction). - Mitral valve: Post mitral valve repair post myomectomy. Mobility   was restricted. - Left atrium: The atrium was mildly dilated. - Pulmonic valve: There was moderate regurgitation. - Pulmonary arteries: Systolic pressure was mildly increased. PA   peak pressure: 33 mm Hg (S).   Assessment / Plan: 1. MV prolapse with severe mitral insufficiency.Now s/p posterior MV leaflet repair. Trivial residual insufficiency by prior Echo. No murmur on exam today. Continue ASA daily.   2. HOCM s/p septal myectomy. No residual gradient. Will update Echo now.   3. PVCs infrequent. Not a candidate for beta blocker due to reactive airway disease.   4.   HTN - well controlled today  5. LBBB. Persistent by Ecg June 2017.   Will plan follow up in one year.

## 2019-08-17 ENCOUNTER — Telehealth: Payer: Self-pay | Admitting: Cardiology

## 2019-08-17 ENCOUNTER — Encounter: Payer: Self-pay | Admitting: Cardiology

## 2019-08-17 ENCOUNTER — Ambulatory Visit (INDEPENDENT_AMBULATORY_CARE_PROVIDER_SITE_OTHER): Payer: Medicare PPO | Admitting: Cardiology

## 2019-08-17 ENCOUNTER — Other Ambulatory Visit: Payer: Self-pay

## 2019-08-17 VITALS — BP 138/80 | HR 70 | Temp 96.4°F | Ht 61.0 in | Wt 167.8 lb

## 2019-08-17 DIAGNOSIS — I447 Left bundle-branch block, unspecified: Secondary | ICD-10-CM

## 2019-08-17 DIAGNOSIS — Z9889 Other specified postprocedural states: Secondary | ICD-10-CM | POA: Diagnosis not present

## 2019-08-17 DIAGNOSIS — I10 Essential (primary) hypertension: Secondary | ICD-10-CM | POA: Diagnosis not present

## 2019-08-17 DIAGNOSIS — I421 Obstructive hypertrophic cardiomyopathy: Secondary | ICD-10-CM | POA: Diagnosis not present

## 2019-08-17 NOTE — Telephone Encounter (Signed)
Patient states that she would like to speak with the nurse in regards to her appointment on today, 08/17/19 at 4:00 PM with Dr. Martinique. Patient would not go into further detail and rejected assistance. Please call

## 2019-08-17 NOTE — Patient Instructions (Addendum)
Medication Instructions:  Continue same medications *If you need a refill on your cardiac medications before your next appointment, please call your pharmacy*  Lab Work: None ordered  Testing/Procedures: Schedule Echo  Follow-Up: At [facility], you and your health needs are our priority.  As part of our continuing mission to provide you with exceptional heart care, we have created designated Provider Care Teams.  These Care Teams include your primary Cardiologist (physician) and Advanced Practice Providers (APPs -  Physician Assistants and Nurse Practitioners) who all work together to provide you with the care you need, when you need it.  Your next appointment:   12 months     Call in Oct to schedule Feb appointment    The format for your next appointment: Office     Provider:  Dr.Jordan

## 2019-08-17 NOTE — Telephone Encounter (Signed)
Pt called asking to speak with cheryl but I advised her that she was ion clinic with Dr. Martinique and asked to take a message... she wanted her husband to come to her appt with her this afternoon but I advised her that with our [place] precautions it would be best if he waits for her in the car and she can have him on Speakerphone during the visit. I explained to her that unless she has a disability she would have to come up for her appt alone.

## 2019-08-31 ENCOUNTER — Other Ambulatory Visit: Payer: Self-pay

## 2019-08-31 ENCOUNTER — Ambulatory Visit (HOSPITAL_COMMUNITY): Payer: Medicare PPO | Attending: Cardiovascular Disease

## 2019-08-31 DIAGNOSIS — I10 Essential (primary) hypertension: Secondary | ICD-10-CM | POA: Diagnosis not present

## 2019-08-31 DIAGNOSIS — Z9889 Other specified postprocedural states: Secondary | ICD-10-CM | POA: Insufficient documentation

## 2019-08-31 DIAGNOSIS — I421 Obstructive hypertrophic cardiomyopathy: Secondary | ICD-10-CM | POA: Diagnosis not present

## 2019-08-31 DIAGNOSIS — I447 Left bundle-branch block, unspecified: Secondary | ICD-10-CM | POA: Insufficient documentation

## 2019-09-03 ENCOUNTER — Ambulatory Visit: Payer: Medicare Other

## 2019-09-05 ENCOUNTER — Other Ambulatory Visit: Payer: Self-pay | Admitting: Cardiology

## 2019-09-07 ENCOUNTER — Other Ambulatory Visit: Payer: Self-pay | Admitting: Cardiology

## 2019-09-07 MED ORDER — SPIRONOLACTONE 50 MG PO TABS: 50.0000 mg | ORAL_TABLET | Freq: Every day | ORAL | 3 refills | Status: DC

## 2020-01-12 ENCOUNTER — Other Ambulatory Visit: Payer: Self-pay | Admitting: Obstetrics & Gynecology

## 2020-01-12 DIAGNOSIS — Z1231 Encounter for screening mammogram for malignant neoplasm of breast: Secondary | ICD-10-CM

## 2020-03-01 ENCOUNTER — Other Ambulatory Visit: Payer: Self-pay

## 2020-03-01 ENCOUNTER — Ambulatory Visit
Admission: RE | Admit: 2020-03-01 | Discharge: 2020-03-01 | Disposition: A | Discharge status: Home / Self Care | Payer: Medicare PPO | Source: Ambulatory Visit | Attending: Obstetrics & Gynecology | Admitting: Obstetrics & Gynecology

## 2020-03-01 DIAGNOSIS — Z1231 Encounter for screening mammogram for malignant neoplasm of breast: Secondary | ICD-10-CM

## 2020-03-04 ENCOUNTER — Ambulatory Visit: Payer: Medicare PPO

## 2020-03-09 ENCOUNTER — Ambulatory Visit
Admission: RE | Admit: 2020-03-09 | Discharge: 2020-03-09 | Disposition: A | Discharge status: Home / Self Care | Payer: Medicare PPO | Source: Ambulatory Visit | Attending: Obstetrics & Gynecology | Admitting: Obstetrics & Gynecology

## 2020-03-09 ENCOUNTER — Other Ambulatory Visit: Payer: Self-pay

## 2020-04-18 DIAGNOSIS — L814 Other melanin hyperpigmentation: Secondary | ICD-10-CM | POA: Diagnosis not present

## 2020-04-18 DIAGNOSIS — Z86018 Personal history of other benign neoplasm: Secondary | ICD-10-CM | POA: Diagnosis not present

## 2020-04-18 DIAGNOSIS — Z85820 Personal history of malignant melanoma of skin: Secondary | ICD-10-CM | POA: Diagnosis not present

## 2020-04-18 DIAGNOSIS — D225 Melanocytic nevi of trunk: Secondary | ICD-10-CM | POA: Diagnosis not present

## 2020-04-18 DIAGNOSIS — L578 Other skin changes due to chronic exposure to nonionizing radiation: Secondary | ICD-10-CM | POA: Diagnosis not present

## 2020-04-18 DIAGNOSIS — L821 Other seborrheic keratosis: Secondary | ICD-10-CM | POA: Diagnosis not present

## 2020-04-18 DIAGNOSIS — D485 Neoplasm of uncertain behavior of skin: Secondary | ICD-10-CM | POA: Diagnosis not present

## 2020-05-10 DIAGNOSIS — H2513 Age-related nuclear cataract, bilateral: Secondary | ICD-10-CM | POA: Diagnosis not present

## 2020-05-10 DIAGNOSIS — H5203 Hypermetropia, bilateral: Secondary | ICD-10-CM | POA: Diagnosis not present

## 2020-05-24 DIAGNOSIS — L905 Scar conditions and fibrosis of skin: Secondary | ICD-10-CM | POA: Diagnosis not present

## 2020-05-24 DIAGNOSIS — D2262 Melanocytic nevi of left upper limb, including shoulder: Secondary | ICD-10-CM | POA: Diagnosis not present

## 2020-05-24 DIAGNOSIS — D485 Neoplasm of uncertain behavior of skin: Secondary | ICD-10-CM | POA: Diagnosis not present

## 2020-07-12 ENCOUNTER — Other Ambulatory Visit: Payer: Self-pay | Admitting: Cardiology

## 2020-08-13 NOTE — Progress Notes (Unsigned)
Janet [name] Date of Birth: 1950/10/31 Medical Record #[id number]  History of Present Illness: Janet [name] is seen for follow HOCM. She is  s/p septal myectomy and MV repair at [facility] clinic on March 09, 2014. This was performed for HOCM with severe LV outflow gradient as well as MV prolapse with severe posterior MV prolapse and severe MR. She had extended septal myectomy with triangular resection of the posterior MV leaflet with a C shaped annuloplasty ring. She did very well in the post op period. No Afib. She did develop a post op LBBB.  Follow up Echo in December 2015 showed no LVOT gradient and only trivial MR. Repeat Echo in July 2017 was stable. Last Echo in Feb. 2021 showed an excellent repair. Normal EF. Mild AI. Moderate LVH  Reports some reactive airway disease on beta blocker in the past even with selective beta blocker.   She is exercising regularly going to the gym.   She denies any chest pain, SOB, or edema. She does note palpitations that wax and wane in severity. More when she is lying down. Tries to avoid stimulants. No dizziness. Planning to go to [place] this month to see a new grandchild.   Current Outpatient Medications on File Prior to Visit  Medication Sig Dispense Refill  . albuterol (PROVENTIL HFA;VENTOLIN HFA) 108 (90 BASE) MCG/ACT inhaler Inhale 2 puffs into the lungs every 6 (six) hours as needed for wheezing.    Marland Kitchen aspirin 81 MG tablet Take 1 tablet (81 mg total) by mouth daily. 30 tablet   . atorvastatin (LIPITOR) 20 MG tablet Take 20 mg by mouth daily.    . Cholecalciferol 100 MCG (4000 UT) CAPS Take 2,000 Units by mouth daily.     . Coenzyme Q10 (CO Q-10) 300 MG CAPS Take 300 mg by mouth daily.    Marland Kitchen diltiazem (CARDIZEM CD) 360 MG 24 hr capsule TAKE 1 CAPSULE(360 MG) BY MOUTH DAILY 90 capsule 3  . Fluticasone Propionate HFA (FLOVENT HFA IN) Inhale into the lungs.    . hydrochlorothiazide (MICROZIDE) 12.5 MG capsule TAKE 1 CAPSULE(12.5 MG) BY MOUTH DAILY 90 capsule 3   . loratadine (CLARITIN) 10 MG tablet Take 10 mg by mouth daily as needed for allergies.    . montelukast (SINGULAIR) 10 MG tablet Take 1 tablet (10 mg total) by mouth at bedtime. 90 tablet 0  . Multiple Vitamin (MULTIVITAMIN ADULT PO) Take by mouth.    . Omega-3 Fatty Acids (FISH OIL) 1200 MG CAPS Take 1 capsule by mouth daily.    Marland Kitchen spironolactone (ALDACTONE) 50 MG tablet Take 1 tablet (50 mg total) by mouth daily. 90 tablet 3   No current facility-administered medications on file prior to visit.    Allergies  Allergen Reactions  . Irbesartan Swelling    Swelling of lips   . Oxycontin [Oxycodone] Hives  . Pneumococcal Vaccines Swelling  . Hydrocodone Nausea And Vomiting    Past Medical History:  Diagnosis Date  . Abrasion of elbow, right 12/29/2015  . Anxiety   . Asthma    daily and prn inhalers  . Breast discharge    right bloody x 2 months  . High cholesterol    takes lipitor  . HOCM (hypertrophic obstructive cardiomyopathy) ([place])   . Hypertension    states under control with med., has been on med. x "years"  . Left bundle branch block (LBBB)   . Leg skin lesion, left    mole removed 12/27/2015  . Melanoma ([place])   .  Nipple lesion 12/2015   right nipple mass  . Parsonage-Turner syndrome 02/02/2016   right  . PONV (postoperative nausea and vomiting)     Past Surgical History:  Procedure Laterality Date  . APPENDECTOMY     age 38  . BREAST DUCTAL SYSTEM EXCISION Right 02/19/2017   Procedure: RIGHT BREAST CENTRAL DUCT EXCISION;  Surgeon: Erroll Luna, MD;  Location: [place];  Service: General;  Laterality: Right;  . BREAST EXCISIONAL BIOPSY Right 2017  . CARDIAC SURGERY  03/09/2014   septal myectomy  . HYSTEROSCOPY WITH D & C  07/21/2004   submucosal myomectomy  . INFERIOR OBLIQUE MYECTOMY  2015  . MASS EXCISION Right 01/04/2016   Procedure: EXCISION OF 1CM RIGHT NIPPLE MASS;  Surgeon: Erroll Luna, MD;  Location: [place];   Service: General;  Laterality: Right;  . MITRAL VALVE REPAIR  03/09/2014  . NAILBED REPAIR Right 10/14/2012   Procedure: BIOPSY NAIL MATRIX RIGHT THUMB;  Surgeon: Wynonia Sours, MD;  Location: [place];  Service: Orthopedics;  Laterality: Right;  . SALPINGOOPHORECTOMY  2017  . TEE WITHOUT CARDIOVERSION N/A 11/10/2013   Procedure: TRANSESOPHAGEAL ECHOCARDIOGRAM (TEE);  Surgeon: Dorothy Spark, MD;  Location[facility] ENDOSCOPY;  Service: Cardiovascular;  Laterality: N/A;    Social History   Tobacco Use  Smoking Status Former Smoker  . Quit date: 07/08/1986  . Years since quitting: 34.1  Smokeless Tobacco Never Used    Social History   Substance and Sexual Activity  Alcohol Use No    Family History  Problem Relation Age of Onset  . Arrhythmia Father        A fib  . Hypertension Father   . Arrhythmia Brother        A fib  . Heart disease Brother        SBE, MV repair  . Breast cancer Maternal Grandmother   . Breast cancer Mother   . Hypertension Mother     Review of Systems: As noted in HPI.  All other systems were reviewed and are negative.  Physical Exam: BP 130/70   Pulse 69   Ht 5' (1.524 m)   Wt 171 lb 3.2 oz (77.7 kg)   BMI 33.44 kg/m   GENERAL:  Well appearing WF in NAD HEENT:  PERRL, EOMI, sclera are clear. Oropharynx is clear. NECK:  No jugular venous distention, carotid upstroke brisk and symmetric, no bruits, no thyromegaly or adenopathy LUNGS:  Clear to auscultation bilaterally CHEST:  Unremarkable HEART:  RRR,  PMI not displaced or sustained,S1 and S2 within normal limits, no S3, no S4: no clicks, no rubs, no murmurs ABD:  Soft, nontender. BS +, no masses or bruits. No hepatomegaly, no splenomegaly EXT:  2 + pulses throughout, no edema, no cyanosis no clubbing SKIN:  Warm and dry.  No rashes NEURO:  Alert and oriented x 3. Cranial nerves II through XII intact. PSYCH:  Cognitively intact    LABORATORY DATA:  Lab Results  Component Value  Date   HGB 12.8 10/14/2012   GLUCOSE 119 (H) 06/09/2018   NA 138 06/09/2018   K 4.7 06/09/2018   CL 96 06/09/2018   CREATININE 0.78 06/09/2018   BUN 15 06/09/2018   CO2 25 06/09/2018   HGBA1C 5.8 (H) 02/02/2016   Labs dated 11/03/15: cholesterol 159, triglycerides 155, LDL 78, HDL 50.  Dated 11/23/16: cholesterol 156, triglycerides 158, HDL 51, LDL 74. A1c 6%.  Dated 12/17/17: cholesterol 161, triglycerides 146,  HDL 49, LDL 83. A1c 6.2%. Hgb 13.6. ALT and TSH normal. Dated 03/12/19: cholesterol 160, triglycerides 152, HDL 51, LDL 78. CMET, CBC, TSH normal.  Dated 04/27/19: A1c 6.2%  Dated 02/29/20: cholesterol 157, triglycerides 175, HDL 59, LDL 69. CBC and CMET normal.  Ecg today shows NSR with first degree AV block. rate 69. LBBB.  Occ. PVC. I have personally reviewed and interpreted this study.   Echo 01/31/16: Study Conclusions  - Left ventricle: The cavity size was normal. There was mild   concentric hypertrophy. Systolic function was normal. The   estimated ejection fraction was in the range of 60% to 65%. No   VSD. Wall motion was normal; there were no regional wall motion   abnormalities. Doppler parameters are consistent with abnormal   left ventricular relaxation (grade 1 diastolic dysfunction). - Mitral valve: Post mitral valve repair post myomectomy. Mobility   was restricted. - Left atrium: The atrium was mildly dilated. - Pulmonic valve: There was moderate regurgitation. - Pulmonary arteries: Systolic pressure was mildly increased. PA   peak pressure: 33 mm Hg (S).  Echo 08/31/19: IMPRESSIONS    1. Left ventricular ejection fraction, by estimation, is 60 to 65%. The  left ventricle has normal function. The left ventricle has no regional  wall motion abnormalities. There is moderate concentric left ventricular  hypertrophy. Left ventricular  diastolic parameters are consistent with Grade I diastolic dysfunction  (impaired relaxation). Elevated left ventricular  end-diastolic pressure.  2. Right ventricular systolic function is normal. The right ventricular  size is normal. There is mildly elevated pulmonary artery systolic  pressure.  3. The mitral valve has been repaired/replaced. Trivial mitral valve  regurgitation. No evidence of mitral stenosis.  4. Aortic regurgitation appears trivial to mild. However PHT consistent  with moderate aortic regurgitation. . The aortic valve is normal in  structure. Aortic valve regurgitation is mild. No aortic stenosis is  present. Aortic regurgitation PHT measures  320 msec.  5. The inferior vena cava is normal in size with greater than 50%  respiratory variability, suggesting right atrial pressure of 3 mmHg.    Assessment / Plan: 1. MV prolapse with severe mitral insufficiency.Now s/p posterior MV leaflet repair. Trivial residual insufficiency by prior Echo. No murmur on exam today. Continue ASA daily.   2. HOCM s/p septal myectomy. No residual gradient. Echo last year looked very good.   3. PVCs infrequent. Not a candidate for beta blocker due to reactive airway disease. Avoid stimulants.   4.  HTN - well controlled today  5. LBBB.    Will plan follow up in one year.

## 2020-08-16 ENCOUNTER — Telehealth: Payer: Self-pay | Admitting: Cardiology

## 2020-08-16 NOTE — Telephone Encounter (Signed)
Did not need this encounter °

## 2020-08-16 NOTE — Telephone Encounter (Signed)
Sis not need this encounter

## 2020-08-17 ENCOUNTER — Ambulatory Visit: Payer: Medicare PPO | Admitting: Cardiology

## 2020-08-17 ENCOUNTER — Other Ambulatory Visit: Payer: Self-pay

## 2020-08-17 ENCOUNTER — Encounter: Payer: Self-pay | Admitting: Cardiology

## 2020-08-17 VITALS — BP 130/70 | HR 69 | Ht 60.0 in | Wt 171.2 lb

## 2020-08-17 DIAGNOSIS — I447 Left bundle-branch block, unspecified: Secondary | ICD-10-CM

## 2020-08-17 DIAGNOSIS — Z9889 Other specified postprocedural states: Secondary | ICD-10-CM

## 2020-08-17 DIAGNOSIS — I10 Essential (primary) hypertension: Secondary | ICD-10-CM | POA: Diagnosis not present

## 2020-08-17 DIAGNOSIS — I421 Obstructive hypertrophic cardiomyopathy: Secondary | ICD-10-CM

## 2020-08-23 ENCOUNTER — Telehealth: Payer: Self-pay | Admitting: Cardiology

## 2020-08-23 ENCOUNTER — Other Ambulatory Visit: Payer: Self-pay | Admitting: Cardiology

## 2020-08-23 MED ORDER — DILTIAZEM HCL ER COATED BEADS 360 MG PO CP24: 360.0000 mg | ORAL_CAPSULE | Freq: Every day | ORAL | 3 refills | Status: DC

## 2020-08-23 NOTE — Telephone Encounter (Signed)
Pt c/o medication issue:  1. Name of Medication: diltiazem (CARDIZEM CD) 360 MG 24 hr capsule  2. How are you currently taking this medication (dosage and times per day)? As directed  3. Are you having a reaction (difficulty breathing--STAT)? no  4. What is your medication issue? Patient wants to know if Dr. Martinique will write her another prescription so that she can get this medication 8 days earlier than she needs it. She states that she is going on Vacation for 3 weeks and will not have enough medication to last her the whole time. Please advise.

## 2020-08-23 NOTE — Telephone Encounter (Signed)
Spoke with patient who is going out of town for 3 weeks. She needs refill for diltiazem. Rx(s) sent to pharmacy electronically.

## 2020-08-29 DIAGNOSIS — Z20822 Contact with and (suspected) exposure to covid-19: Secondary | ICD-10-CM | POA: Diagnosis not present

## 2020-08-29 DIAGNOSIS — Z03818 Encounter for observation for suspected exposure to other biological agents ruled out: Secondary | ICD-10-CM | POA: Diagnosis not present

## 2020-10-11 ENCOUNTER — Other Ambulatory Visit: Payer: Self-pay | Admitting: Cardiology

## 2020-12-20 DIAGNOSIS — D229 Melanocytic nevi, unspecified: Secondary | ICD-10-CM | POA: Diagnosis not present

## 2020-12-20 DIAGNOSIS — L720 Epidermal cyst: Secondary | ICD-10-CM | POA: Diagnosis not present

## 2020-12-20 DIAGNOSIS — L814 Other melanin hyperpigmentation: Secondary | ICD-10-CM | POA: Diagnosis not present

## 2020-12-20 DIAGNOSIS — L821 Other seborrheic keratosis: Secondary | ICD-10-CM | POA: Diagnosis not present

## 2020-12-20 DIAGNOSIS — D239 Other benign neoplasm of skin, unspecified: Secondary | ICD-10-CM | POA: Diagnosis not present

## 2020-12-20 DIAGNOSIS — Z86006 Personal history of melanoma in-situ: Secondary | ICD-10-CM | POA: Diagnosis not present

## 2021-01-17 DIAGNOSIS — E669 Obesity, unspecified: Secondary | ICD-10-CM | POA: Diagnosis not present

## 2021-01-17 DIAGNOSIS — Z7982 Long term (current) use of aspirin: Secondary | ICD-10-CM | POA: Diagnosis not present

## 2021-01-17 DIAGNOSIS — J45909 Unspecified asthma, uncomplicated: Secondary | ICD-10-CM | POA: Diagnosis not present

## 2021-01-17 DIAGNOSIS — Z6833 Body mass index (BMI) 33.0-33.9, adult: Secondary | ICD-10-CM | POA: Diagnosis not present

## 2021-01-17 DIAGNOSIS — Z7951 Long term (current) use of inhaled steroids: Secondary | ICD-10-CM | POA: Diagnosis not present

## 2021-01-17 DIAGNOSIS — K5900 Constipation, unspecified: Secondary | ICD-10-CM | POA: Diagnosis not present

## 2021-01-17 DIAGNOSIS — R32 Unspecified urinary incontinence: Secondary | ICD-10-CM | POA: Diagnosis not present

## 2021-01-17 DIAGNOSIS — I10 Essential (primary) hypertension: Secondary | ICD-10-CM | POA: Diagnosis not present

## 2021-01-17 DIAGNOSIS — E785 Hyperlipidemia, unspecified: Secondary | ICD-10-CM | POA: Diagnosis not present

## 2021-02-13 ENCOUNTER — Other Ambulatory Visit: Payer: Self-pay | Admitting: Obstetrics & Gynecology

## 2021-02-13 DIAGNOSIS — Z1231 Encounter for screening mammogram for malignant neoplasm of breast: Secondary | ICD-10-CM

## 2021-03-06 DIAGNOSIS — E559 Vitamin D deficiency, unspecified: Secondary | ICD-10-CM | POA: Diagnosis not present

## 2021-03-06 DIAGNOSIS — J4520 Mild intermittent asthma, uncomplicated: Secondary | ICD-10-CM | POA: Diagnosis not present

## 2021-03-06 DIAGNOSIS — F339 Major depressive disorder, recurrent, unspecified: Secondary | ICD-10-CM | POA: Diagnosis not present

## 2021-03-06 DIAGNOSIS — I10 Essential (primary) hypertension: Secondary | ICD-10-CM | POA: Diagnosis not present

## 2021-03-06 DIAGNOSIS — Z85820 Personal history of malignant melanoma of skin: Secondary | ICD-10-CM | POA: Diagnosis not present

## 2021-03-06 DIAGNOSIS — T783XXA Angioneurotic edema, initial encounter: Secondary | ICD-10-CM | POA: Diagnosis not present

## 2021-03-06 DIAGNOSIS — Z1389 Encounter for screening for other disorder: Secondary | ICD-10-CM | POA: Diagnosis not present

## 2021-03-06 DIAGNOSIS — Z0000 Encounter for general adult medical examination without abnormal findings: Secondary | ICD-10-CM | POA: Diagnosis not present

## 2021-03-06 DIAGNOSIS — Z79899 Other long term (current) drug therapy: Secondary | ICD-10-CM | POA: Diagnosis not present

## 2021-03-06 DIAGNOSIS — E669 Obesity, unspecified: Secondary | ICD-10-CM | POA: Diagnosis not present

## 2021-05-01 ENCOUNTER — Other Ambulatory Visit: Payer: Self-pay

## 2021-05-01 ENCOUNTER — Ambulatory Visit
Admission: RE | Admit: 2021-05-01 | Discharge: 2021-05-01 | Disposition: A | Discharge status: Home / Self Care | Payer: Medicare PPO | Source: Ambulatory Visit | Attending: Obstetrics & Gynecology | Admitting: Obstetrics & Gynecology

## 2021-05-01 DIAGNOSIS — Z1231 Encounter for screening mammogram for malignant neoplasm of breast: Secondary | ICD-10-CM | POA: Diagnosis not present

## 2021-05-11 DIAGNOSIS — H43811 Vitreous degeneration, right eye: Secondary | ICD-10-CM | POA: Diagnosis not present

## 2021-05-11 DIAGNOSIS — H5203 Hypermetropia, bilateral: Secondary | ICD-10-CM | POA: Diagnosis not present

## 2021-05-11 DIAGNOSIS — H2513 Age-related nuclear cataract, bilateral: Secondary | ICD-10-CM | POA: Diagnosis not present

## 2021-05-25 ENCOUNTER — Other Ambulatory Visit: Payer: Self-pay | Admitting: Cardiology

## 2021-08-15 DIAGNOSIS — L814 Other melanin hyperpigmentation: Secondary | ICD-10-CM | POA: Diagnosis not present

## 2021-08-15 DIAGNOSIS — Z86018 Personal history of other benign neoplasm: Secondary | ICD-10-CM | POA: Diagnosis not present

## 2021-08-15 DIAGNOSIS — L821 Other seborrheic keratosis: Secondary | ICD-10-CM | POA: Diagnosis not present

## 2021-08-15 DIAGNOSIS — Z85820 Personal history of malignant melanoma of skin: Secondary | ICD-10-CM | POA: Diagnosis not present

## 2021-08-15 DIAGNOSIS — D225 Melanocytic nevi of trunk: Secondary | ICD-10-CM | POA: Diagnosis not present

## 2021-08-15 DIAGNOSIS — L578 Other skin changes due to chronic exposure to nonionizing radiation: Secondary | ICD-10-CM | POA: Diagnosis not present

## 2021-08-15 DIAGNOSIS — D485 Neoplasm of uncertain behavior of skin: Secondary | ICD-10-CM | POA: Diagnosis not present

## 2021-08-15 DIAGNOSIS — Z23 Encounter for immunization: Secondary | ICD-10-CM | POA: Diagnosis not present

## 2021-08-17 DIAGNOSIS — D485 Neoplasm of uncertain behavior of skin: Secondary | ICD-10-CM | POA: Diagnosis not present

## 2021-08-17 DIAGNOSIS — D225 Melanocytic nevi of trunk: Secondary | ICD-10-CM | POA: Diagnosis not present

## 2021-09-21 NOTE — Progress Notes (Signed)
? ?Janet [name] ?Date of Birth: 1950/10/06 ?Medical Record #[id number] ? ?History of Present Illness: ?Janet [name] is seen for follow HOCM. She is  s/p septal myectomy and MV repair at [facility] clinic on March 09, 2014. This was performed for HOCM with severe LV outflow gradient as well as MV prolapse with severe posterior MV prolapse and severe MR. She had extended septal myectomy with triangular resection of the posterior MV leaflet with a C shaped annuloplasty ring. She did very well in the post op period. No Afib. She did develop a post op LBBB.  Follow up Echo in December 2015 showed no LVOT gradient and only trivial MR. Repeat Echo in July 2017 was stable. Last Echo in Feb. 2021 showed an excellent repair. Normal EF. Mild AI. Moderate LVH ? ?Reports some reactive airway disease on beta blocker in the past even with selective beta blocker.  ? ?She is doing well on follow up. Breathing is ok but still notes she can't get a deep breath. Has gained 8 lbs which she attributes to lack of exercise.   She denies any chest pain, palpitations, or edema.Tries to avoid stimulants. No dizziness. She has a grandchild in [place] ? ?Current Outpatient Medications on File Prior to Visit  ?Medication Sig Dispense Refill  ? albuterol (PROVENTIL HFA;VENTOLIN HFA) 108 (90 BASE) MCG/ACT inhaler Inhale 2 puffs into the lungs every 6 (six) hours as needed for wheezing.    ? aspirin 81 MG tablet Take 1 tablet (81 mg total) by mouth daily. 30 tablet   ? atorvastatin (LIPITOR) 20 MG tablet Take 20 mg by mouth daily.    ? Cholecalciferol 100 MCG (4000 UT) CAPS Take 2,000 Units by mouth daily.     ? Coenzyme Q10 (CO Q-10) 300 MG CAPS Take 300 mg by mouth daily.    ? Fluticasone Propionate HFA (FLOVENT HFA IN) Inhale into the lungs.    ? hydrochlorothiazide (MICROZIDE) 12.5 MG capsule TAKE 1 CAPSULE(12.5 MG) BY MOUTH DAILY 90 capsule 3  ? loratadine (CLARITIN) 10 MG tablet Take 10 mg by mouth daily as needed for allergies.    ? montelukast  (SINGULAIR) 10 MG tablet Take 1 tablet (10 mg total) by mouth at bedtime. 90 tablet 0  ? Multiple Vitamin (MULTIVITAMIN ADULT PO) Take by mouth.    ? spironolactone (ALDACTONE) 50 MG tablet TAKE 1 TABLET(50 MG) BY MOUTH DAILY 90 tablet 3  ? ?No current facility-administered medications on file prior to visit.  ? ? ?Allergies  ?Allergen Reactions  ? Irbesartan Swelling  ?  Swelling of lips   ? Oxycontin [Oxycodone] Hives  ? Pneumococcal Vaccines Swelling  ? Hydrocodone Nausea And Vomiting  ? ? ?Past Medical History:  ?Diagnosis Date  ? Abrasion of elbow, right 12/29/2015  ? Anxiety   ? Asthma   ? daily and prn inhalers  ? Breast discharge   ? right bloody x 2 months  ? High cholesterol   ? takes lipitor  ? HOCM (hypertrophic obstructive cardiomyopathy) ([place])   ? Hypertension   ? states under control with med., has been on med. x "years"  ? Left bundle branch block (LBBB)   ? Leg skin lesion, left   ? mole removed 12/27/2015  ? Melanoma ([place])   ? Nipple lesion 12/2015  ? right nipple mass  ? [place]-Turner syndrome 02/02/2016  ? right  ? PONV (postoperative nausea and vomiting)   ? ? ?Past Surgical History:  ?Procedure Laterality Date  ? APPENDECTOMY    ?  age 21  ? [place] Right 02/19/2017  ? Procedure: RIGHT BREAST CENTRAL DUCT EXCISION;  Surgeon: Erroll Luna, MD;  Location: [place];  Service: General;  Laterality: Right;  ? BREAST EXCISIONAL BIOPSY Right 2017  ? CARDIAC SURGERY  03/09/2014  ? septal myectomy  ? HYSTEROSCOPY WITH D & C  07/21/2004  ? submucosal myomectomy  ? INFERIOR OBLIQUE MYECTOMY  2015  ? MASS EXCISION Right 01/04/2016  ? Procedure: EXCISION OF 1CM RIGHT NIPPLE MASS;  Surgeon: Erroll Luna, MD;  Location: [place];  Service: General;  Laterality: Right;  ? MITRAL VALVE REPAIR  03/09/2014  ? NAILBED REPAIR Right 10/14/2012  ? Procedure: BIOPSY NAIL MATRIX RIGHT THUMB;  Surgeon: Wynonia Sours, MD;  Location: [place];  Service:  Orthopedics;  Laterality: Right;  ? SALPINGOOPHORECTOMY  2017  ? TEE WITHOUT CARDIOVERSION N/A 11/10/2013  ? Procedure: TRANSESOPHAGEAL ECHOCARDIOGRAM (TEE);  Surgeon: Dorothy Spark, MD;  Location: [place];  Service: Cardiovascular;  Laterality: N/A;  ? ? ?Social History  ? ?Tobacco Use  ?Smoking Status Former  ? Types: Cigarettes  ? Quit date: 07/08/1986  ? Years since quitting: 35.2  ?Smokeless Tobacco Never  ? ? ?Social History  ? ?Substance and Sexual Activity  ?Alcohol Use No  ? ? ?Family History  ?Problem Relation Age of Onset  ? Arrhythmia Father   ?     A fib  ? Hypertension Father   ? Arrhythmia Brother   ?     A fib  ? Heart disease Brother   ?     SBE, MV repair  ? Breast cancer Maternal Grandmother   ? Breast cancer Mother   ? Hypertension Mother   ? ? ?Review of Systems: ?As noted in HPI.  All other systems were reviewed and are negative. ? ?Physical Exam: ?BP 136/82 (BP Location: Right Arm, Patient Position: Sitting, Cuff Size: Normal)   Pulse 81   Ht '5\' 1"'$  (1.549 m)   Wt 179 lb (81.2 kg)   BMI 33.82 kg/m?   ?GENERAL:  Well appearing WF in NAD ?HEENT:  PERRL, EOMI, sclera are clear. Oropharynx is clear. ?NECK:  No jugular venous distention, carotid upstroke brisk and symmetric, no bruits, no thyromegaly or adenopathy ?LUNGS:  Clear to auscultation bilaterally ?CHEST:  Unremarkable ?HEART:  RRR,  PMI not displaced or sustained,S1 and S2 within normal limits, no S3, no S4: no clicks, no rubs, no murmurs ?ABD:  Soft, nontender. BS +, no masses or bruits. No hepatomegaly, no splenomegaly ?EXT:  2 + pulses throughout, no edema, no cyanosis no clubbing ?SKIN:  Warm and dry.  No rashes ?NEURO:  Alert and oriented x 3. Cranial nerves II through XII intact. ?PSYCH:  Cognitively intact ? ? ? ?LABORATORY DATA: ? ?Lab Results  ?Component Value Date  ? HGB 12.8 10/14/2012  ? GLUCOSE 119 (H) 06/09/2018  ? NA 138 06/09/2018  ? K 4.7 06/09/2018  ? CL 96 06/09/2018  ? CREATININE 0.78 06/09/2018  ? BUN 15  06/09/2018  ? CO2 25 06/09/2018  ? HGBA1C 5.8 (H) 02/02/2016  ? ?Labs dated 11/03/15: cholesterol 159, triglycerides 155, LDL 78, HDL 50.  ?Dated 11/23/16: cholesterol 156, triglycerides 158, HDL 51, LDL 74. A1c 6%.  ?Dated 12/17/17: cholesterol 161, triglycerides 146, HDL 49, LDL 83. A1c 6.2%. Hgb 13.6. ALT and TSH normal. ?Dated 03/12/19: cholesterol 160, triglycerides 152, HDL 51, LDL 78. CMET, CBC, TSH normal.  ?Dated 04/27/19: A1c  6.2%  ?Dated 02/29/20: cholesterol 157, triglycerides 175, HDL 59, LDL 69. CBC and CMET normal. ?Dated 03/06/21: cholesterol 169, triglycerides 206, HDL 53, LDL 81.  ?Glucose 108, otherwise CBC, CMET and TSH normal ? ? ?Ecg today shows NSR with first degree AV block. rate 81. LBBB.  LAD. I have personally reviewed and interpreted this study. ? ? ?Echo 01/31/16: ?Study Conclusions ?  ?- Left ventricle: The cavity size was normal. There was mild ?  concentric hypertrophy. Systolic function was normal. The ?  estimated ejection fraction was in the range of 60% to 65%. No ?  VSD. Wall motion was normal; there were no regional wall motion ?  abnormalities. Doppler parameters are consistent with abnormal ?  left ventricular relaxation (grade 1 diastolic dysfunction). ?- Mitral valve: Post mitral valve repair post myomectomy. Mobility ?  was restricted. ?- Left atrium: The atrium was mildly dilated. ?- Pulmonic valve: There was moderate regurgitation. ?- Pulmonary arteries: Systolic pressure was mildly increased. PA ?  peak pressure: 33 mm Hg (S). ? ?Echo 08/31/19: IMPRESSIONS  ? ? ? 1. Left ventricular ejection fraction, by estimation, is 60 to 65%. The  ?left ventricle has normal function. The left ventricle has no regional  ?wall motion abnormalities. There is moderate concentric left ventricular  ?hypertrophy. Left ventricular  ?diastolic parameters are consistent with Grade I diastolic dysfunction  ?(impaired relaxation). Elevated left ventricular end-diastolic pressure.  ? 2. Right ventricular  systolic function is normal. The right ventricular  ?size is normal. There is mildly elevated pulmonary artery systolic  ?pressure.  ? 3. The mitral valve has been repaired/replaced. Trivial mitral valve  ?regurgitat

## 2021-09-26 ENCOUNTER — Encounter: Payer: Self-pay | Admitting: Cardiology

## 2021-09-26 ENCOUNTER — Other Ambulatory Visit: Payer: Self-pay

## 2021-09-26 ENCOUNTER — Ambulatory Visit: Payer: Medicare PPO | Admitting: Cardiology

## 2021-09-26 VITALS — BP 136/82 | HR 81 | Ht 61.0 in | Wt 179.0 lb

## 2021-09-26 DIAGNOSIS — Z9889 Other specified postprocedural states: Secondary | ICD-10-CM | POA: Diagnosis not present

## 2021-09-26 DIAGNOSIS — I447 Left bundle-branch block, unspecified: Secondary | ICD-10-CM | POA: Diagnosis not present

## 2021-09-26 DIAGNOSIS — I10 Essential (primary) hypertension: Secondary | ICD-10-CM | POA: Diagnosis not present

## 2021-09-26 DIAGNOSIS — I421 Obstructive hypertrophic cardiomyopathy: Secondary | ICD-10-CM

## 2021-09-26 MED ORDER — DILTIAZEM HCL ER COATED BEADS 360 MG PO CP24: ORAL_CAPSULE | ORAL | 3 refills | Status: DC

## 2021-09-28 DIAGNOSIS — I10 Essential (primary) hypertension: Secondary | ICD-10-CM | POA: Diagnosis not present

## 2021-09-28 DIAGNOSIS — E669 Obesity, unspecified: Secondary | ICD-10-CM | POA: Diagnosis not present

## 2021-09-28 DIAGNOSIS — K5900 Constipation, unspecified: Secondary | ICD-10-CM | POA: Diagnosis not present

## 2021-09-28 DIAGNOSIS — Z720 Tobacco use: Secondary | ICD-10-CM | POA: Diagnosis not present

## 2021-09-28 DIAGNOSIS — J45909 Unspecified asthma, uncomplicated: Secondary | ICD-10-CM | POA: Diagnosis not present

## 2021-09-28 DIAGNOSIS — Z7951 Long term (current) use of inhaled steroids: Secondary | ICD-10-CM | POA: Diagnosis not present

## 2021-09-28 DIAGNOSIS — I2510 Atherosclerotic heart disease of native coronary artery without angina pectoris: Secondary | ICD-10-CM | POA: Diagnosis not present

## 2021-09-28 DIAGNOSIS — E785 Hyperlipidemia, unspecified: Secondary | ICD-10-CM | POA: Diagnosis not present

## 2021-09-28 DIAGNOSIS — Z6833 Body mass index (BMI) 33.0-33.9, adult: Secondary | ICD-10-CM | POA: Diagnosis not present

## 2022-02-12 DIAGNOSIS — Z124 Encounter for screening for malignant neoplasm of cervix: Secondary | ICD-10-CM | POA: Diagnosis not present

## 2022-02-12 DIAGNOSIS — Z6833 Body mass index (BMI) 33.0-33.9, adult: Secondary | ICD-10-CM | POA: Diagnosis not present

## 2022-02-14 DIAGNOSIS — Z86006 Personal history of melanoma in-situ: Secondary | ICD-10-CM | POA: Diagnosis not present

## 2022-02-14 DIAGNOSIS — L814 Other melanin hyperpigmentation: Secondary | ICD-10-CM | POA: Diagnosis not present

## 2022-02-14 DIAGNOSIS — D239 Other benign neoplasm of skin, unspecified: Secondary | ICD-10-CM | POA: Diagnosis not present

## 2022-02-14 DIAGNOSIS — D229 Melanocytic nevi, unspecified: Secondary | ICD-10-CM | POA: Diagnosis not present

## 2022-02-14 DIAGNOSIS — L821 Other seborrheic keratosis: Secondary | ICD-10-CM | POA: Diagnosis not present

## 2022-02-14 DIAGNOSIS — D1801 Hemangioma of skin and subcutaneous tissue: Secondary | ICD-10-CM | POA: Diagnosis not present

## 2022-03-08 DIAGNOSIS — I421 Obstructive hypertrophic cardiomyopathy: Secondary | ICD-10-CM | POA: Diagnosis not present

## 2022-03-08 DIAGNOSIS — E559 Vitamin D deficiency, unspecified: Secondary | ICD-10-CM | POA: Diagnosis not present

## 2022-03-08 DIAGNOSIS — E669 Obesity, unspecified: Secondary | ICD-10-CM | POA: Diagnosis not present

## 2022-03-08 DIAGNOSIS — Z1331 Encounter for screening for depression: Secondary | ICD-10-CM | POA: Diagnosis not present

## 2022-03-08 DIAGNOSIS — J4520 Mild intermittent asthma, uncomplicated: Secondary | ICD-10-CM | POA: Diagnosis not present

## 2022-03-08 DIAGNOSIS — T783XXA Angioneurotic edema, initial encounter: Secondary | ICD-10-CM | POA: Diagnosis not present

## 2022-03-08 DIAGNOSIS — Z0000 Encounter for general adult medical examination without abnormal findings: Secondary | ICD-10-CM | POA: Diagnosis not present

## 2022-03-08 DIAGNOSIS — Z79899 Other long term (current) drug therapy: Secondary | ICD-10-CM | POA: Diagnosis not present

## 2022-03-08 DIAGNOSIS — I10 Essential (primary) hypertension: Secondary | ICD-10-CM | POA: Diagnosis not present

## 2022-03-08 DIAGNOSIS — F339 Major depressive disorder, recurrent, unspecified: Secondary | ICD-10-CM | POA: Diagnosis not present

## 2022-03-08 DIAGNOSIS — Z85820 Personal history of malignant melanoma of skin: Secondary | ICD-10-CM | POA: Diagnosis not present

## 2022-04-24 DIAGNOSIS — I10 Essential (primary) hypertension: Secondary | ICD-10-CM | POA: Diagnosis not present

## 2022-04-25 DIAGNOSIS — Z8262 Family history of osteoporosis: Secondary | ICD-10-CM | POA: Diagnosis not present

## 2022-04-25 DIAGNOSIS — J45909 Unspecified asthma, uncomplicated: Secondary | ICD-10-CM | POA: Diagnosis not present

## 2022-04-25 DIAGNOSIS — N958 Other specified menopausal and perimenopausal disorders: Secondary | ICD-10-CM | POA: Diagnosis not present

## 2022-04-25 DIAGNOSIS — Z13820 Encounter for screening for osteoporosis: Secondary | ICD-10-CM | POA: Diagnosis not present

## 2022-04-27 ENCOUNTER — Other Ambulatory Visit: Payer: Self-pay | Admitting: *Deleted

## 2022-04-27 DIAGNOSIS — D72829 Elevated white blood cell count, unspecified: Secondary | ICD-10-CM

## 2022-04-27 NOTE — Progress Notes (Signed)
Orders entered for New patient appt on 10/27. Lab appt at 1100 and provider appt at 1115 with Ned Card NP. Patient confirmed

## 2022-05-03 NOTE — Progress Notes (Signed)
New Hematology/Oncology Consult   Requesting MD: Thayer Ohm, [place]      Reason for Consult: Lymphocytosis  HPI: Janet [name] is a 71 year old woman referred for evaluation of lymphocytosis.  CBC 04/24/2022 with total white count 16.6, absolute lymphocyte count 7.7, remainder of CBC in normal range.  CBC 03/08/2022 with total white count elevated at 13.6, absolute lymphocyte count 5.7 (1.1-2.7).  Remainder of the CBC was unremarkable.  Chemistry panel also 03/08/2022 unremarkable; B12 normal.  Comparison CBC 03/06/2021-total white count mildly elevated at 11.1 (4.0-11.0), absolute lymphocyte count 3.6 (1.1-2.7); 02/29/2020-total white count 11 (upper end of normal range), absolute lymphocyte count elevated 3.6; 03/12/2019-total white count 12.8, absolute lymphocytes elevated at 3.7; 12/17/2017 total white count normal 8.4.  She denies recent infections.  No issues with recurrent infections.  She has periodic mild sweats at nighttime.  To her this feels like a hot flash.  The sweats are not drenching and do not occur every night.  She has a good appetite.  No weight loss.  She has not noticed any enlarged lymph nodes.  She reports asthma is well controlled.  She uses a steroid inhaler 4 times a day as maintenance.  No recent oral steroids.  She rarely requires albuterol.    She reports a paternal aunt died at age 45 with leukemia, paternal cousin had CLL, another paternal cousin potentially had sarcoma.  Her maternal grandmother had cancer and her mother died with breast cancer.     Past Medical History:  Diagnosis Date   Abrasion of elbow, right 12/29/2015   Anxiety    Asthma    daily and prn inhalers   Breast discharge    right bloody x 2 months   High cholesterol    takes lipitor   HOCM (hypertrophic obstructive cardiomyopathy) ([place])    Hypertension    states under control with med., has been on med. x "years"   Left bundle branch block (LBBB)    Leg skin lesion, left     mole removed 12/27/2015   Melanoma ([place])    Nipple lesion 12/2015   right nipple mass   [place]arson[place]-Turner syndrome 02/02/2016   right   PONV (postoperative nausea and vomiting)      Past Surgical History:  Procedure Laterality Date   APPENDECTOMY     age 54   [place] Right 02/19/2017   Procedure: RIGHT BREAST CENTRAL DUCT EXCISION;  Surgeon: Erroll Luna, MD;  Location: [place];  Service: General;  Laterality: Right;   BREAST EXCISIONAL BIOPSY Right 2017   CARDIAC SURGERY  03/09/2014   septal myectomy   HYSTEROSCOPY WITH D & C  07/21/2004   submucosal myomectomy   INFERIOR OBLIQUE MYECTOMY  2015   MASS EXCISION Right 01/04/2016   Procedure: EXCISION OF 1CM RIGHT NIPPLE MASS;  Surgeon: Erroll Luna, MD;  Location: [place];  Service: General;  Laterality: Right;   MITRAL VALVE REPAIR  03/09/2014   NAILBED REPAIR Right 10/14/2012   Procedure: BIOPSY NAIL MATRIX RIGHT THUMB;  Surgeon: Wynonia Sours, MD;  Location: [place];  Service: Orthopedics;  Laterality: Right;   SALPINGOOPHORECTOMY  2017   TEE WITHOUT CARDIOVERSION N/A 11/10/2013   Procedure: TRANSESOPHAGEAL ECHOCARDIOGRAM (TEE);  Surgeon: Dorothy Spark, MD;  Location: [facility] ENDOSCOPY;  Service: Cardiovascular;  Laterality: N/A;     Current Outpatient Medications:    albuterol (PROVENTIL HFA;VENTOLIN HFA) 108 (90 BASE) MCG/ACT inhaler, Inhale 2 puffs  into the lungs every 6 (six) hours as needed for wheezing., Disp: , Rfl:    aspirin 81 MG tablet, Take 1 tablet (81 mg total) by mouth daily., Disp: 30 tablet, Rfl:    atorvastatin (LIPITOR) 20 MG tablet, Take 20 mg by mouth daily., Disp: , Rfl:    Cholecalciferol 100 MCG (4000 UT) CAPS, Take 2,000 Units by mouth daily. , Disp: , Rfl:    Coenzyme Q10 (CO Q-10) 300 MG CAPS, Take 300 mg by mouth daily., Disp: , Rfl:    diltiazem (CARDIZEM CD) 360 MG 24 hr capsule, TAKE 1 CAPSULE(360 MG) BY MOUTH DAILY, Disp: 90  capsule, Rfl: 3   Fluticasone Propionate HFA (FLOVENT HFA IN), Inhale into the lungs., Disp: , Rfl:    hydrochlorothiazide (MICROZIDE) 12.5 MG capsule, TAKE 1 CAPSULE(12.5 MG) BY MOUTH DAILY, Disp: 90 capsule, Rfl: 3   loratadine (CLARITIN) 10 MG tablet, Take 10 mg by mouth daily as needed for allergies., Disp: , Rfl:    montelukast (SINGULAIR) 10 MG tablet, Take 1 tablet (10 mg total) by mouth at bedtime., Disp: 90 tablet, Rfl: 0   Multiple Vitamin (MULTIVITAMIN ADULT PO), Take by mouth., Disp: , Rfl:    Omega-3 Fatty Acids (FISH OIL) 300 MG CAPS, Take by mouth., Disp: , Rfl:    spironolactone (ALDACTONE) 50 MG tablet, TAKE 1 TABLET(50 MG) BY MOUTH DAILY, Disp: 90 tablet, Rfl: 3:    Allergies  Allergen Reactions   Irbesartan Swelling    Swelling of lips    Oxycontin [Oxycodone] Hives   Pneumococcal Vaccines Swelling   Hydrocodone Nausea And Vomiting    FH: Paternal aunt deceased at age 54 with leukemia; paternal cousin CLL; paternal cousin question sarcoma; maternal grandmother with history of breast and colon cancer, died at age 47; mother deceased with breast cancer.  Patient reports undergoing genetic testing in 2019, reported negative.  SOCIAL HISTORY: She lives in [place].  She is married.  No tobacco use.  Review of Systems: (in addition to HPI) she has stable dyspnea on exertion.  No dysphagia.  No change in bowel habits.  No bloody or black stools.  No urinary symptoms.  She is very anxious.  Physical Exam:  Blood pressure 114/74, pulse 87, temperature 98.1 F (36.7 C), temperature source Oral, resp. rate 18, height '5\' 1"'$  (1.549 m), weight 173 lb 4.8 oz (78.6 kg), SpO2 98 %.  HEENT: No thrush or ulcers. Lungs: Lungs clear bilaterally. Cardiac: Regular rate and rhythm. Abdomen: No hepatosplenomegaly. Vascular: No leg edema. Lymph nodes: Question a few shotty axillary lymph nodes versus prominent fat pads.  No cervical, supraclavicular, or inguinal lymph  nodes. Neurologic: Alert and oriented. Skin: No rash.  2 scars, left lower and left upper arm, without signs of recurrence.  LABS:   Recent Labs    05/04/22 1048  WBC 17.6*  HGB 14.9  HCT 44.6  PLT 406*  Peripheral blood smear-majority of the white blood cells are mature appearing neutrophils, there is an increased population of mature appearing lymphocytes with dense chromatin and little cytoplasm; polychromasia not increased, no nucleated red blood cells, red blood cell morphology unremarkable; platelets appear normal in number.  No results for input(s): "NA", "K", "CL", "CO2", "GLUCOSE", "BUN", "CREATININE", "CALCIUM" in the last 72 hours.    RADIOLOGY:  No results found.  Assessment and Plan:   Lymphocytosis Asthma Hypertension History of melanoma in situ History of hypertrophic cardiomyopathy Family history of CLL (paternal cousin) Anxiety  Janet [name] was referred for  evaluation of lymphocytosis.  The lymphocytosis has been present for the past 2 to 3 years.  Review of the peripheral blood smear indicates possible CLL.  We are requesting peripheral blood for flow cytometry, FISH prognostic panel.  The tentative diagnosis was reviewed with her and her husband at today's visit.  There is no indication for treatment at present.  She understands the increased risk of infection and will remain up-to-date on vaccinations.  She is experiencing anxiety and requested a prescription for an antianxiety medication.  Xanax 0.25 mg twice daily as needed sent to her pharmacy.  We will return for lab and follow-up in 3 months.  We will contact her once the outstanding labs from today are available.  Patient seen with Dr. Benay Spice.    Ned Card, NP 05/04/2022, 12:27 PM  This was a shared visit with Ned Card.  Janet [name] was interviewed and examined.  I reviewed the peripheral blood smear.  She is referred for evaluation of leukocytosis.  She has a history of lymphocytosis  for the past several years.  There is an atypical lymphoid population peripheral blood smear, most consistent with a diagnosis of chronic lymphocytic leukemia.  Visual diagnosis includes a benign cytosis and other low-grade lymphoproliferative disorders.  The peripheral blood will be submitted for flow cytometry and a FISH panel.  We will contact her when these results are available.  We explained the increased risk of infection in patients with CLL.  We recommend she stay up-to-date on influenza, COVID-19, RSV, and pneumococcal vaccines.  If she is confirmed to have CLL there is no indication for treatment at present.  I was present for greater than 50% of today's visit.  I performed medical decision making.  Julieanne Manson, MD

## 2022-05-04 ENCOUNTER — Telehealth: Payer: Self-pay | Admitting: Nurse Practitioner

## 2022-05-04 ENCOUNTER — Inpatient Hospital Stay: Payer: Medicare PPO | Admitting: Nurse Practitioner

## 2022-05-04 ENCOUNTER — Inpatient Hospital Stay: Payer: Medicare PPO | Attending: Oncology

## 2022-05-04 ENCOUNTER — Other Ambulatory Visit: Payer: Self-pay | Admitting: Physician Assistant

## 2022-05-04 ENCOUNTER — Encounter: Payer: Self-pay | Admitting: Nurse Practitioner

## 2022-05-04 ENCOUNTER — Telehealth: Payer: Self-pay

## 2022-05-04 ENCOUNTER — Other Ambulatory Visit: Payer: Self-pay

## 2022-05-04 VITALS — BP 114/74 | HR 87 | Temp 98.1°F | Resp 18 | Ht 61.0 in | Wt 173.3 lb

## 2022-05-04 DIAGNOSIS — Z86006 Personal history of melanoma in-situ: Secondary | ICD-10-CM | POA: Diagnosis not present

## 2022-05-04 DIAGNOSIS — I10 Essential (primary) hypertension: Secondary | ICD-10-CM | POA: Diagnosis not present

## 2022-05-04 DIAGNOSIS — F419 Anxiety disorder, unspecified: Secondary | ICD-10-CM | POA: Diagnosis not present

## 2022-05-04 DIAGNOSIS — D72829 Elevated white blood cell count, unspecified: Secondary | ICD-10-CM

## 2022-05-04 DIAGNOSIS — Z79899 Other long term (current) drug therapy: Secondary | ICD-10-CM

## 2022-05-04 DIAGNOSIS — J45909 Unspecified asthma, uncomplicated: Secondary | ICD-10-CM | POA: Diagnosis not present

## 2022-05-04 DIAGNOSIS — Z1231 Encounter for screening mammogram for malignant neoplasm of breast: Secondary | ICD-10-CM

## 2022-05-04 DIAGNOSIS — D72820 Lymphocytosis (symptomatic): Secondary | ICD-10-CM | POA: Insufficient documentation

## 2022-05-04 DIAGNOSIS — Z806 Family history of leukemia: Secondary | ICD-10-CM

## 2022-05-04 LAB — CBC WITH DIFFERENTIAL (CANCER CENTER ONLY)
Abs Immature Granulocytes: 0.06 10*3/uL (ref 0.00–0.07)
Basophils Absolute: 0.1 10*3/uL (ref 0.0–0.1)
Basophils Relative: 1 %
Eosinophils Absolute: 0.2 10*3/uL (ref 0.0–0.5)
Eosinophils Relative: 1 %
HCT: 44.6 % (ref 36.0–46.0)
Hemoglobin: 14.9 g/dL (ref 12.0–15.0)
Immature Granulocytes: 0 %
Lymphocytes Relative: 47 %
Lymphs Abs: 8.3 10*3/uL — ABNORMAL HIGH (ref 0.7–4.0)
MCH: 31.4 pg (ref 26.0–34.0)
MCHC: 33.4 g/dL (ref 30.0–36.0)
MCV: 94.1 fL (ref 80.0–100.0)
Monocytes Absolute: 1.2 10*3/uL — ABNORMAL HIGH (ref 0.1–1.0)
Monocytes Relative: 7 %
Neutro Abs: 7.8 10*3/uL — ABNORMAL HIGH (ref 1.7–7.7)
Neutrophils Relative %: 44 %
Platelet Count: 406 10*3/uL — ABNORMAL HIGH (ref 150–400)
RBC: 4.74 MIL/uL (ref 3.87–5.11)
RDW: 12 % (ref 11.5–15.5)
WBC Count: 17.6 10*3/uL — ABNORMAL HIGH (ref 4.0–10.5)
nRBC: 0 % (ref 0.0–0.2)

## 2022-05-04 LAB — SAVE SMEAR(SSMR), FOR PROVIDER SLIDE REVIEW

## 2022-05-04 LAB — LACTATE DEHYDROGENASE: LDH: 141 U/L (ref 98–192)

## 2022-05-04 MED ORDER — ALPRAZOLAM 0.25 MG PO TABS: 0.2500 mg | ORAL_TABLET | Freq: Two times a day (BID) | ORAL | 0 refills | Status: DC | PRN

## 2022-05-04 NOTE — Telephone Encounter (Signed)
Attempted to contact patient in regards to scheduling a lab for next week, no answer so voicemail was left for patient to call back

## 2022-05-04 NOTE — Telephone Encounter (Signed)
Patient agree to come

## 2022-05-04 NOTE — Telephone Encounter (Signed)
Patient agrees to come on 05/07/22 for lab.

## 2022-05-07 ENCOUNTER — Telehealth: Payer: Self-pay | Admitting: *Deleted

## 2022-05-07 ENCOUNTER — Inpatient Hospital Stay: Payer: Medicare PPO

## 2022-05-07 DIAGNOSIS — D72829 Elevated white blood cell count, unspecified: Secondary | ICD-10-CM | POA: Diagnosis not present

## 2022-05-07 DIAGNOSIS — D75839 Thrombocytosis, unspecified: Secondary | ICD-10-CM | POA: Diagnosis not present

## 2022-05-07 DIAGNOSIS — D72820 Lymphocytosis (symptomatic): Secondary | ICD-10-CM | POA: Diagnosis not present

## 2022-05-07 NOTE — Telephone Encounter (Signed)
Husband called to request a face-to-face appointment after all her labs return to discuss. Came back today for flow cytometry and FISH, CLL prognostic panel. Per Dr. Benay Spice: schedule for 11/13 or 11/14 at 3:30 pm. Scheduling message sent.

## 2022-05-08 LAB — SURGICAL PATHOLOGY

## 2022-05-09 ENCOUNTER — Telehealth: Payer: Self-pay | Admitting: *Deleted

## 2022-05-09 ENCOUNTER — Ambulatory Visit
Admission: RE | Admit: 2022-05-09 | Discharge: 2022-05-09 | Disposition: A | Discharge status: Home / Self Care | Payer: Medicare PPO | Source: Ambulatory Visit | Attending: Physician Assistant | Admitting: Physician Assistant

## 2022-05-09 DIAGNOSIS — Z1231 Encounter for screening mammogram for malignant neoplasm of breast: Secondary | ICD-10-CM | POA: Diagnosis not present

## 2022-05-09 LAB — MULTIPLE MYELOMA PANEL, SERUM
Albumin SerPl Elph-Mcnc: 4.2 g/dL (ref 2.9–4.4)
Albumin/Glob SerPl: 1.6 (ref 0.7–1.7)
Alpha 1: 0.2 g/dL (ref 0.0–0.4)
Alpha2 Glob SerPl Elph-Mcnc: 0.7 g/dL (ref 0.4–1.0)
B-Globulin SerPl Elph-Mcnc: 1 g/dL (ref 0.7–1.3)
Gamma Glob SerPl Elph-Mcnc: 1 g/dL (ref 0.4–1.8)
Globulin, Total: 2.8 g/dL (ref 2.2–3.9)
IgA: 258 mg/dL (ref 64–422)
IgG (Immunoglobin G), Serum: 1040 mg/dL (ref 586–1602)
IgM (Immunoglobulin M), Srm: 35 mg/dL (ref 26–217)
Total Protein ELP: 7 g/dL (ref 6.0–8.5)

## 2022-05-09 NOTE — Telephone Encounter (Signed)
-----   Message from Ladell Pier, MD sent at 05/08/2022  4:55 PM EDT ----- Please call patient, flow cytometry is consistent with a diagnosis of chronic lymphocytic leukemia, follow-up as scheduled

## 2022-05-09 NOTE — Telephone Encounter (Signed)
Notified patient that flow cytometry is consistent with chronic lymphocytic leukemia. Will call her as more results come in and to f/u as scheduled.

## 2022-05-11 LAB — FLOW CYTOMETRY

## 2022-05-14 ENCOUNTER — Other Ambulatory Visit: Payer: Self-pay | Admitting: Physician Assistant

## 2022-05-14 DIAGNOSIS — R928 Other abnormal and inconclusive findings on diagnostic imaging of breast: Secondary | ICD-10-CM

## 2022-05-16 ENCOUNTER — Telehealth: Payer: Self-pay | Admitting: Cardiology

## 2022-05-16 DIAGNOSIS — H43813 Vitreous degeneration, bilateral: Secondary | ICD-10-CM | POA: Diagnosis not present

## 2022-05-16 DIAGNOSIS — H524 Presbyopia: Secondary | ICD-10-CM | POA: Diagnosis not present

## 2022-05-16 DIAGNOSIS — H5203 Hypermetropia, bilateral: Secondary | ICD-10-CM | POA: Diagnosis not present

## 2022-05-16 DIAGNOSIS — H2513 Age-related nuclear cataract, bilateral: Secondary | ICD-10-CM | POA: Diagnosis not present

## 2022-05-16 DIAGNOSIS — H52203 Unspecified astigmatism, bilateral: Secondary | ICD-10-CM | POA: Diagnosis not present

## 2022-05-16 DIAGNOSIS — H25043 Posterior subcapsular polar age-related cataract, bilateral: Secondary | ICD-10-CM | POA: Diagnosis not present

## 2022-05-16 DIAGNOSIS — H5010 Unspecified exotropia: Secondary | ICD-10-CM | POA: Diagnosis not present

## 2022-05-16 NOTE — Telephone Encounter (Signed)
*  STAT* If patient is at the pharmacy, call can be transferred to refill team.   1. Which medications need to be refilled? (please list name of each medication and dose if known)  spironolactone (ALDACTONE) 50 MG tablet   2. Which pharmacy/location (including street and city if local pharmacy) is medication to be sent to?  [place], [place] - [address] AT [place]    3. Do they need a 30 day or 90 day supply? 90 day

## 2022-05-17 ENCOUNTER — Ambulatory Visit
Admission: RE | Admit: 2022-05-17 | Discharge: 2022-05-17 | Disposition: A | Discharge status: Home / Self Care | Payer: Medicare PPO | Source: Ambulatory Visit | Attending: Physician Assistant | Admitting: Physician Assistant

## 2022-05-17 ENCOUNTER — Other Ambulatory Visit: Payer: Self-pay | Admitting: Physician Assistant

## 2022-05-17 DIAGNOSIS — R928 Other abnormal and inconclusive findings on diagnostic imaging of breast: Secondary | ICD-10-CM

## 2022-05-17 DIAGNOSIS — R599 Enlarged lymph nodes, unspecified: Secondary | ICD-10-CM

## 2022-05-17 NOTE — Telephone Encounter (Signed)
Patient called back stating her pharmacy has not received the refill prescription.  Patient stated she has 4 days left of this medication.

## 2022-05-18 MED ORDER — SPIRONOLACTONE 50 MG PO TABS: ORAL_TABLET | ORAL | 3 refills | Status: AC

## 2022-05-21 ENCOUNTER — Inpatient Hospital Stay: Payer: Medicare PPO | Attending: Oncology | Admitting: Oncology

## 2022-05-21 VITALS — BP 158/71 | HR 94 | Temp 98.1°F | Resp 18 | Ht 61.0 in | Wt 175.0 lb

## 2022-05-21 DIAGNOSIS — R590 Localized enlarged lymph nodes: Secondary | ICD-10-CM | POA: Insufficient documentation

## 2022-05-21 DIAGNOSIS — C9110 Chronic lymphocytic leukemia of B-cell type not having achieved remission: Secondary | ICD-10-CM | POA: Diagnosis not present

## 2022-05-21 NOTE — Progress Notes (Signed)
  [place] OFFICE PROGRESS NOTE   Diagnosis: CLL  INTERVAL HISTORY:   Janet [name] returns as scheduled.  She is here with her husband.  She feels well.  No fever or night sweats.  No palpable lymph nodes.  She underwent a screening mammogram on 05/09/2022.  There was a possible right breast mass and bilateral enlarged axillary lymph nodes.  A right diagnostic mammogram and ultrasound confirmed a persistent 7 mm right breast mass consistent with an intramammary lymph node and right axillary adenopathy.  She is scheduled for a breast and axillary lymph node biopsy tomorrow.  Objective:  Vital signs in last 24 hours:  Blood pressure (!) 158/71, pulse 94, temperature 98.1 F (36.7 C), temperature source Oral, resp. rate 18, height _0  (1.549 m), weight 175 lb (79.4 kg), SpO2 99 %.    Lymphatics: No cervical or supraclavicular nodes.  High medial bilateral 2 cm axillary nodes Resp: Lungs clear bilaterally Cardio: Regular rate and rhythm GI: No hepatosplenomegaly, no mass, nontender Vascular: No leg edema   Lab Results:  Lab Results  Component Value Date   WBC 17.6 (H) 05/04/2022   HGB 14.9 05/04/2022   HCT 44.6 05/04/2022   MCV 94.1 05/04/2022   PLT 406 (H) 05/04/2022   NEUTROABS 7.8 (H) 05/04/2022    CMP  Lab Results  Component Value Date   NA 138 06/09/2018   K 4.7 06/09/2018   CL 96 06/09/2018   CO2 25 06/09/2018   GLUCOSE 119 (H) 06/09/2018   BUN 15 06/09/2018   CREATININE 0.78 06/09/2018   CALCIUM 9.7 06/09/2018   GFRNONAA 78 06/09/2018   GFRAA 90 06/09/2018   Medications: I have reviewed the patient's current medications.   Assessment/Plan: CLL Peripheral blood flow cytometry 05/07/2022-CD5, CD19, CD20, CD200, and kappa positive clonal B-cell population consistent with CLL FISH-no 17p13 deletion, deletion of ATM (11q22.3) and 13q14.3 Normal immunoglobulin levels and negative serum immunofixation 05/04/2022 Asthma Hypertension History of  melanoma in situ History of hypertrophic cardiomyopathy Family history of CLL (paternal cousin) Anxiety 8.   Right breast mass Mammogram 05/09/2022-possible right breast mass, bilateral axillary lymphadenopathy Diagnostic right mammogram and right ultrasound 05/17/2022-persistent oval mass in the lower outer right breast, bulky right axillary adenopathy, targeted ultrasound-7 mm oval right breast mass at 9:00 compatible with an abnormal appearing intramammary lymph node, multiple enlarged right axillary nodes    Disposition: Janet [name] has been diagnosed with CLL.  She is asymptomatic.  She has early stage CLL.  We reviewed indications for treating CLL.  We reviewed the flow cytometry and FISH prognostic panel results.  I recommend observation. She has normal immunoglobulin levels.  She will remain up-to-date on vaccines and she will seek medical attention for symptoms of an infection.  She will undergo a right breast and right axillary lymph node biopsy tomorrow.  I suspect the pathology will confirm involvement with CLL.  We will follow-up on the pathology report.  She will return for an office visit as scheduled on 08/03/2022.  She will call in the interim for new symptoms.  Betsy Coder, MD  05/21/2022  4:51 PM

## 2022-05-22 ENCOUNTER — Other Ambulatory Visit (HOSPITAL_COMMUNITY)
Admission: RE | Admit: 2022-05-22 | Discharge: 2022-05-22 | Disposition: A | Discharge status: Home / Self Care | Payer: Medicare PPO | Source: Ambulatory Visit | Attending: Diagnostic Radiology | Admitting: Diagnostic Radiology

## 2022-05-22 ENCOUNTER — Ambulatory Visit
Admission: RE | Admit: 2022-05-22 | Discharge: 2022-05-22 | Disposition: A | Discharge status: Home / Self Care | Payer: Medicare PPO | Source: Ambulatory Visit | Attending: Physician Assistant | Admitting: Physician Assistant

## 2022-05-22 DIAGNOSIS — R928 Other abnormal and inconclusive findings on diagnostic imaging of breast: Secondary | ICD-10-CM

## 2022-05-22 DIAGNOSIS — N6315 Unspecified lump in the right breast, overlapping quadrants: Secondary | ICD-10-CM | POA: Diagnosis not present

## 2022-05-22 DIAGNOSIS — C9110 Chronic lymphocytic leukemia of B-cell type not having achieved remission: Secondary | ICD-10-CM | POA: Diagnosis not present

## 2022-05-22 DIAGNOSIS — R599 Enlarged lymph nodes, unspecified: Secondary | ICD-10-CM

## 2022-05-22 DIAGNOSIS — R590 Localized enlarged lymph nodes: Secondary | ICD-10-CM | POA: Diagnosis not present

## 2022-05-22 HISTORY — PX: BREAST BIOPSY: SHX20

## 2022-05-24 LAB — SURGICAL PATHOLOGY

## 2022-05-25 ENCOUNTER — Other Ambulatory Visit: Payer: Medicare PPO

## 2022-06-01 LAB — FISH,CLL PROGNOSTIC PANEL

## 2022-06-08 DIAGNOSIS — I10 Essential (primary) hypertension: Secondary | ICD-10-CM | POA: Diagnosis not present

## 2022-06-08 DIAGNOSIS — I421 Obstructive hypertrophic cardiomyopathy: Secondary | ICD-10-CM | POA: Diagnosis not present

## 2022-06-08 DIAGNOSIS — F419 Anxiety disorder, unspecified: Secondary | ICD-10-CM | POA: Diagnosis not present

## 2022-06-08 DIAGNOSIS — J4520 Mild intermittent asthma, uncomplicated: Secondary | ICD-10-CM | POA: Diagnosis not present

## 2022-06-08 DIAGNOSIS — C9110 Chronic lymphocytic leukemia of B-cell type not having achieved remission: Secondary | ICD-10-CM | POA: Diagnosis not present

## 2022-06-08 DIAGNOSIS — F5104 Psychophysiologic insomnia: Secondary | ICD-10-CM | POA: Diagnosis not present

## 2022-06-08 DIAGNOSIS — E782 Mixed hyperlipidemia: Secondary | ICD-10-CM | POA: Diagnosis not present

## 2022-07-27 DIAGNOSIS — F324 Major depressive disorder, single episode, in partial remission: Secondary | ICD-10-CM | POA: Diagnosis not present

## 2022-07-27 DIAGNOSIS — I10 Essential (primary) hypertension: Secondary | ICD-10-CM | POA: Diagnosis not present

## 2022-07-27 DIAGNOSIS — J45909 Unspecified asthma, uncomplicated: Secondary | ICD-10-CM | POA: Diagnosis not present

## 2022-07-27 DIAGNOSIS — E785 Hyperlipidemia, unspecified: Secondary | ICD-10-CM | POA: Diagnosis not present

## 2022-07-27 DIAGNOSIS — F419 Anxiety disorder, unspecified: Secondary | ICD-10-CM | POA: Diagnosis not present

## 2022-07-27 DIAGNOSIS — E669 Obesity, unspecified: Secondary | ICD-10-CM | POA: Diagnosis not present

## 2022-07-27 DIAGNOSIS — I2510 Atherosclerotic heart disease of native coronary artery without angina pectoris: Secondary | ICD-10-CM | POA: Diagnosis not present

## 2022-07-27 DIAGNOSIS — R32 Unspecified urinary incontinence: Secondary | ICD-10-CM | POA: Diagnosis not present

## 2022-07-27 DIAGNOSIS — H259 Unspecified age-related cataract: Secondary | ICD-10-CM | POA: Diagnosis not present

## 2022-08-03 ENCOUNTER — Inpatient Hospital Stay: Payer: Medicare PPO | Attending: Oncology

## 2022-08-03 ENCOUNTER — Inpatient Hospital Stay: Payer: Medicare PPO | Admitting: Oncology

## 2022-08-03 VITALS — BP 140/59 | HR 80 | Temp 98.1°F | Resp 20 | Ht 61.0 in | Wt 177.0 lb

## 2022-08-03 DIAGNOSIS — Z806 Family history of leukemia: Secondary | ICD-10-CM | POA: Insufficient documentation

## 2022-08-03 DIAGNOSIS — C9110 Chronic lymphocytic leukemia of B-cell type not having achieved remission: Secondary | ICD-10-CM | POA: Insufficient documentation

## 2022-08-03 DIAGNOSIS — D72829 Elevated white blood cell count, unspecified: Secondary | ICD-10-CM

## 2022-08-03 DIAGNOSIS — F419 Anxiety disorder, unspecified: Secondary | ICD-10-CM | POA: Insufficient documentation

## 2022-08-03 DIAGNOSIS — J45909 Unspecified asthma, uncomplicated: Secondary | ICD-10-CM | POA: Diagnosis not present

## 2022-08-03 LAB — CBC WITH DIFFERENTIAL (CANCER CENTER ONLY)
Abs Immature Granulocytes: 0.07 10*3/uL (ref 0.00–0.07)
Basophils Absolute: 0.1 10*3/uL (ref 0.0–0.1)
Basophils Relative: 0 %
Eosinophils Absolute: 0.2 10*3/uL (ref 0.0–0.5)
Eosinophils Relative: 1 %
HCT: 42.5 % (ref 36.0–46.0)
Hemoglobin: 14.2 g/dL (ref 12.0–15.0)
Immature Granulocytes: 0 %
Lymphocytes Relative: 61 %
Lymphs Abs: 10.9 10*3/uL — ABNORMAL HIGH (ref 0.7–4.0)
MCH: 31.3 pg (ref 26.0–34.0)
MCHC: 33.4 g/dL (ref 30.0–36.0)
MCV: 93.8 fL (ref 80.0–100.0)
Monocytes Absolute: 0.8 10*3/uL (ref 0.1–1.0)
Monocytes Relative: 4 %
Neutro Abs: 6.2 10*3/uL (ref 1.7–7.7)
Neutrophils Relative %: 34 %
Platelet Count: 349 10*3/uL (ref 150–400)
RBC: 4.53 MIL/uL (ref 3.87–5.11)
RDW: 12.2 % (ref 11.5–15.5)
Smear Review: NORMAL
WBC Count: 18.2 10*3/uL — ABNORMAL HIGH (ref 4.0–10.5)
nRBC: 0 % (ref 0.0–0.2)

## 2022-08-03 NOTE — Progress Notes (Signed)
  [place] OFFICE PROGRESS NOTE   Diagnosis: CLL  INTERVAL HISTORY:   Janet [name] returns as scheduled.  She has discomfort at the left "hip "with prolonged ambulation.  No fever, night sweats, or palpable lymph nodes.  Good appetite.  Objective:  Vital signs in last 24 hours:  Blood pressure (!) 140/59, pulse 80, temperature 98.1 F (36.7 C), temperature source Oral, resp. rate 20, height '5\' 1"'$  (1.549 m), weight 177 lb (80.3 kg), SpO2 98 %.    Lymphatics: Pea-sized right scalene node, high bilateral axillary nodes measuring up to 2 cm Resp: Lungs clear bilaterally Cardio: Regular rate and rhythm GI: No hepatosplenomegaly Vascular: No leg edema   Lab Results:  Lab Results  Component Value Date   WBC 18.2 (H) 08/03/2022   HGB 14.2 08/03/2022   HCT 42.5 08/03/2022   MCV 93.8 08/03/2022   PLT 349 08/03/2022   NEUTROABS PENDING 08/03/2022    CMP  Lab Results  Component Value Date   NA 138 06/09/2018   K 4.7 06/09/2018   CL 96 06/09/2018   CO2 25 06/09/2018   GLUCOSE 119 (H) 06/09/2018   BUN 15 06/09/2018   CREATININE 0.78 06/09/2018   CALCIUM 9.7 06/09/2018   GFRNONAA 78 06/09/2018   GFRAA 90 06/09/2018     Medications: I have reviewed the patient's current medications.   Assessment/Plan:  CLL Peripheral blood flow cytometry 05/07/2022-CD5, CD19, CD20, CD200, and kappa positive clonal B-cell population consistent with CLL FISH-no 17p13 deletion, deletion of ATM (11q22.3) and 13q14.3 Normal immunoglobulin levels and negative serum immunofixation 05/04/2022 Asthma Hypertension History of melanoma in situ History of hypertrophic cardiomyopathy Family history of CLL (paternal cousin) Anxiety 8.   Right breast mass Mammogram 05/09/2022-possible right breast mass, bilateral axillary lymphadenopathy Diagnostic right mammogram and right ultrasound 05/17/2022-persistent oval mass in the lower outer right breast, bulky right axillary adenopathy,  targeted ultrasound-7 mm oval right breast mass at 9:00 compatible with an abnormal appearing intramammary lymph node, multiple enlarged right axillary nodes 05/22/2022-right breast and axillary needle core biopsies-CLL, CD20, CD5, and CD200 positive 05/22/2022-flow cytometry on the breast and axillary lymph node biopsies combined revealed a monoclonal kappa restricted B-cell population with expression of CD20, CD5, and CD200.  CD38 positive     Disposition: Janet [name] has CLL.  She is asymptomatic and stable from a hematologic standpoint.  There is no indication for treating the CLL.  We discussed the potential for an adverse prognosis associated with the CD38 positivity.  I recommend continued observation.  She will return for an office and lab visit in 3 months.  Betsy Coder, MD  08/03/2022  8:58 AM

## 2022-08-13 DIAGNOSIS — L570 Actinic keratosis: Secondary | ICD-10-CM | POA: Diagnosis not present

## 2022-08-13 DIAGNOSIS — D2262 Melanocytic nevi of left upper limb, including shoulder: Secondary | ICD-10-CM | POA: Diagnosis not present

## 2022-08-13 DIAGNOSIS — L814 Other melanin hyperpigmentation: Secondary | ICD-10-CM | POA: Diagnosis not present

## 2022-08-13 DIAGNOSIS — D225 Melanocytic nevi of trunk: Secondary | ICD-10-CM | POA: Diagnosis not present

## 2022-08-13 DIAGNOSIS — L578 Other skin changes due to chronic exposure to nonionizing radiation: Secondary | ICD-10-CM | POA: Diagnosis not present

## 2022-08-13 DIAGNOSIS — D485 Neoplasm of uncertain behavior of skin: Secondary | ICD-10-CM | POA: Diagnosis not present

## 2022-08-13 DIAGNOSIS — Z85820 Personal history of malignant melanoma of skin: Secondary | ICD-10-CM | POA: Diagnosis not present

## 2022-08-13 DIAGNOSIS — Z86018 Personal history of other benign neoplasm: Secondary | ICD-10-CM | POA: Diagnosis not present

## 2022-08-13 DIAGNOSIS — L821 Other seborrheic keratosis: Secondary | ICD-10-CM | POA: Diagnosis not present

## 2022-10-03 ENCOUNTER — Ambulatory Visit: Payer: Medicare PPO | Attending: Cardiology | Admitting: Cardiology

## 2022-10-03 ENCOUNTER — Encounter: Payer: Self-pay | Admitting: Cardiology

## 2022-10-03 VITALS — BP 136/80 | HR 92 | Ht 61.0 in | Wt 177.2 lb

## 2022-10-03 DIAGNOSIS — I421 Obstructive hypertrophic cardiomyopathy: Secondary | ICD-10-CM | POA: Diagnosis not present

## 2022-10-03 DIAGNOSIS — R0609 Other forms of dyspnea: Secondary | ICD-10-CM

## 2022-10-03 DIAGNOSIS — I10 Essential (primary) hypertension: Secondary | ICD-10-CM | POA: Diagnosis not present

## 2022-10-03 DIAGNOSIS — Z9889 Other specified postprocedural states: Secondary | ICD-10-CM | POA: Diagnosis not present

## 2022-10-03 DIAGNOSIS — I447 Left bundle-branch block, unspecified: Secondary | ICD-10-CM | POA: Diagnosis not present

## 2022-10-03 NOTE — Patient Instructions (Signed)
Medication Instructions:  Continue same medications *If you need a refill on your cardiac medications before your next appointment, please call your pharmacy*   Lab Work: None ordered   Testing/Procedures: Echo   Follow-Up: At [facility], you and your health needs are our priority.  As part of our continuing mission to provide you with exceptional heart care, we have created designated Provider Care Teams.  These Care Teams include your primary Cardiologist (physician) and Advanced Practice Providers (APPs -  Physician Assistants and Nurse Practitioners) who all work together to provide you with the care you need, when you need it.  We recommend signing up for the patient portal called "MyChart".  Sign up information is provided on this After Visit Summary.  MyChart is used to connect with patients for Virtual Visits (Telemedicine).  Patients are able to view lab/test results, encounter notes, upcoming appointments, etc.  Non-urgent messages can be sent to your provider as well.   To learn more about what you can do with MyChart, go to [email or web address].    Your next appointment:  1 year    Call in Dec to schedule March appointment     Provider:  Dr.Jordan

## 2022-10-03 NOTE — Progress Notes (Signed)
Janet [name] Date of Birth: 12-Jun-1951 Medical Record [id number]  History of Present Illness: Janet [name] is seen for follow HOCM. She is  s/p septal myectomy and MV repair at [facility] clinic on March 09, 2014. This was performed for HOCM with severe LV outflow gradient as well as MV prolapse with severe posterior MV prolapse and severe MR. She had extended septal myectomy with triangular resection of the posterior MV leaflet with a C shaped annuloplasty ring. She did very well in the post op period. No Afib. She did develop a post op LBBB.  Follow up Echo in December 2015 showed no LVOT gradient and only trivial MR. Repeat Echo in July 2017 was stable. Last Echo in Feb. 2021 showed an excellent repair. Normal EF. Mild AI. Moderate LVH  Reports some reactive airway disease on beta blocker in the past even with selective beta blocker.   She is doing well on follow up. Breathing is ok but still notes shortness of breath when going up hill or steps. Weight is down 4 lbs. No edema. Has been recommended a different inhaler but she hasn't started yet.   She denies any chest pain, palpitations, or edema. No dizziness. She has a grandchild in [place]. She has been diagnosed with low grade CLL. Followed by Dr Benay Spice.   Current Outpatient Medications on File Prior to Visit  Medication Sig Dispense Refill   albuterol (PROVENTIL HFA;VENTOLIN HFA) 108 (90 BASE) MCG/ACT inhaler Inhale 2 puffs into the lungs every 6 (six) hours as needed for wheezing.     aspirin 81 MG tablet Take 1 tablet (81 mg total) by mouth daily. 30 tablet    atorvastatin (LIPITOR) 20 MG tablet Take 20 mg by mouth daily.     Cholecalciferol (VITAMIN D-3) 125 MCG (5000 UT) TABS Take 1 capsule by mouth daily.     Coenzyme Q10 (CO Q-10) 300 MG CAPS Take 300 mg by mouth daily.     diltiazem (CARDIZEM CD) 360 MG 24 hr capsule TAKE 1 CAPSULE(360 MG) BY MOUTH DAILY 90 capsule 3   hydrochlorothiazide (MICROZIDE) 12.5 MG capsule TAKE 1 CAPSULE(12.5  MG) BY MOUTH DAILY 90 capsule 3   loratadine (CLARITIN) 10 MG tablet Take 10 mg by mouth daily as needed for allergies.     montelukast (SINGULAIR) 10 MG tablet Take 1 tablet (10 mg total) by mouth at bedtime. 90 tablet 0   Multiple Vitamin (MULTIVITAMIN ADULT PO) Take by mouth.     Omega-3 Fatty Acids (FISH OIL) 300 MG CAPS Take by mouth.     Polyethylene Glycol 3350 (MIRALAX PO) Take 17 g by mouth daily.     spironolactone (ALDACTONE) 50 MG tablet TAKE 1 TABLET(50 MG) BY MOUTH DAILY 90 tablet 3   No current facility-administered medications on file prior to visit.    Allergies  Allergen Reactions   Irbesartan Swelling    Swelling of lips    Oxycontin [Oxycodone] Hives   Pneumococcal Vaccines Swelling   Hydrocodone Nausea And Vomiting    Past Medical History:  Diagnosis Date   Abrasion of elbow, right 12/29/2015   Anxiety    Asthma    daily and prn inhalers   Breast discharge    right bloody x 2 months   High cholesterol    takes lipitor   HOCM (hypertrophic obstructive cardiomyopathy) ([place])    Hypertension    states under control with med., has been on med. x "years"   Left bundle branch block (LBBB)  Leg skin lesion, left    mole removed 12/27/2015   Melanoma ([place])    Nipple lesion 12/2015   right nipple mass   [place]-Turner syndrome 02/02/2016   right   PONV (postoperative nausea and vomiting)     Past Surgical History:  Procedure Laterality Date   APPENDECTOMY     age 51   BREAST BIOPSY Right 05/22/2022   [place] RT BREAST BX W LOC DEV 1ST LESION IMG BX SPEC US GUIDE 05/22/2022 [facility] MAMMOGRAPHY   BREAST DUCTAL SYSTEM EXCISION Right 02/19/2017   Procedure: RIGHT BREAST CENTRAL DUCT EXCISION;  Surgeon: Erroll Luna, MD;  Location: [place];  Service: General;  Laterality: Right;   BREAST EXCISIONAL BIOPSY Right 2017   CARDIAC SURGERY  03/09/2014   septal myectomy   HYSTEROSCOPY WITH D & C  07/21/2004   submucosal myomectomy   INFERIOR OBLIQUE  MYECTOMY  2015   MASS EXCISION Right 01/04/2016   Procedure: EXCISION OF 1CM RIGHT NIPPLE MASS;  Surgeon: Erroll Luna, MD;  Location: [place];  Service: General;  Laterality: Right;   MITRAL VALVE REPAIR  03/09/2014   NAILBED REPAIR Right 10/14/2012   Procedure: BIOPSY NAIL MATRIX RIGHT THUMB;  Surgeon: Wynonia Sours, MD;  Location: [place];  Service: Orthopedics;  Laterality: Right;   SALPINGOOPHORECTOMY  2017   TEE WITHOUT CARDIOVERSION N/A 11/10/2013   Procedure: TRANSESOPHAGEAL ECHOCARDIOGRAM (TEE);  Surgeon: Dorothy Spark, MD;  Location: [facility];  Service: Cardiovascular;  Laterality: N/A;    Social History   Tobacco Use  Smoking Status Former   Types: Cigarettes   Quit date: 07/08/1986   Years since quitting: 36.2  Smokeless Tobacco Never    Social History   Substance and Sexual Activity  Alcohol Use No    Family History  Problem Relation Age of Onset   Arrhythmia Father        A fib   Hypertension Father    Arrhythmia Brother        A fib   Heart disease Brother        SBE, MV repair   Breast cancer Maternal Grandmother    Breast cancer Mother    Hypertension Mother     Review of Systems: As noted in HPI.  All other systems were reviewed and are negative.  Physical Exam: BP 136/80   Pulse 92   Ht 5\' 1"  (1.549 m)   Wt 177 lb 3.2 oz (80.4 kg)   SpO2 97%   BMI 33.48 kg/m   GENERAL:  Well appearing WF in NAD HEENT:  PERRL, EOMI, sclera are clear. Oropharynx is clear. NECK:  No jugular venous distention, carotid upstroke brisk and symmetric, no bruits, no thyromegaly or adenopathy LUNGS:  Clear to auscultation bilaterally CHEST:  Unremarkable HEART:  RRR,  PMI not displaced or sustained,S1 and S2 within normal limits, no S3, no S4: no clicks, no rubs, soft systolic murmur RUSB ABD:  Soft, nontender. BS +, no masses or bruits. No hepatomegaly, no splenomegaly EXT:  2 + pulses throughout, no edema, no cyanosis no  clubbing SKIN:  Warm and dry.  No rashes NEURO:  Alert and oriented x 3. Cranial nerves II through XII intact. PSYCH:  Cognitively intact    LABORATORY DATA:  Lab Results  Component Value Date   WBC 18.2 (H) 08/03/2022   HGB 14.2 08/03/2022   HCT 42.5 08/03/2022   PLT 349 08/03/2022   GLUCOSE 119 (H) 06/09/2018  NA 138 06/09/2018   K 4.7 06/09/2018   CL 96 06/09/2018   CREATININE 0.78 06/09/2018   BUN 15 06/09/2018   CO2 25 06/09/2018   HGBA1C 5.8 (H) 02/02/2016   Labs dated 11/03/15: cholesterol 159, triglycerides 155, LDL 78, HDL 50.  Dated 11/23/16: cholesterol 156, triglycerides 158, HDL 51, LDL 74. A1c 6%.  Dated 12/17/17: cholesterol 161, triglycerides 146, HDL 49, LDL 83. A1c 6.2%. Hgb 13.6. ALT and TSH normal. Dated 03/12/19: cholesterol 160, triglycerides 152, HDL 51, LDL 78. CMET, CBC, TSH normal.  Dated 04/27/19: A1c 6.2%  Dated 02/29/20: cholesterol 157, triglycerides 175, HDL 59, LDL 69. CBC and CMET normal. Dated 03/06/21: cholesterol 169, triglycerides 206, HDL 53, LDL 81.  Glucose 108, otherwise CBC, CMET and TSH normal Dated 03/08/22: cholesterol 165, triglycerides 159, HDL 53, LDL 84. CMET and TSH normal.    Ecg today shows NSR with first degree AV block. rate 74. LBBB.   I have personally reviewed and interpreted this study.   Echo 01/31/16: Study Conclusions   - Left ventricle: The cavity size was normal. There was mild   concentric hypertrophy. Systolic function was normal. The   estimated ejection fraction was in the range of 60% to 65%. No   VSD. Wall motion was normal; there were no regional wall motion   abnormalities. Doppler parameters are consistent with abnormal   left ventricular relaxation (grade 1 diastolic dysfunction). - Mitral valve: Post mitral valve repair post myomectomy. Mobility   was restricted. - Left atrium: The atrium was mildly dilated. - Pulmonic valve: There was moderate regurgitation. - Pulmonary arteries: Systolic pressure  was mildly increased. PA   peak pressure: 33 mm Hg (S).  Echo 08/31/19: IMPRESSIONS     1. Left ventricular ejection fraction, by estimation, is 60 to 65%. The  left ventricle has normal function. The left ventricle has no regional  wall motion abnormalities. There is moderate concentric left ventricular  hypertrophy. Left ventricular  diastolic parameters are consistent with Grade I diastolic dysfunction  (impaired relaxation). Elevated left ventricular end-diastolic pressure.   2. Right ventricular systolic function is normal. The right ventricular  size is normal. There is mildly elevated pulmonary artery systolic  pressure.   3. The mitral valve has been repaired/replaced. Trivial mitral valve  regurgitation. No evidence of mitral stenosis.   4. Aortic regurgitation appears trivial to mild. However PHT consistent  with moderate aortic regurgitation. . The aortic valve is normal in  structure. Aortic valve regurgitation is mild. No aortic stenosis is  present. Aortic regurgitation PHT measures  320 msec.   5. The inferior vena cava is normal in size with greater than 50%  respiratory variability, suggesting right atrial pressure of 3 mmHg.    Assessment / Plan:  1. MV prolapse with severe mitral insufficiency.Now s/p posterior MV leaflet repair in 2015. Trivial residual insufficiency by prior Echo. Soft murmur on exam. Will update Echo. Continue ASA daily.   2. HOCM s/p septal myectomy in 2015. No residual gradient. Last Echo looked good.  She does have some SOB on exertion. Will update Echo. If stable would encourage her to try the inhaler   3. PVCs infrequent. Not a candidate for beta blocker due to reactive airway disease. Avoid stimulants.   4.  HTN - well controlled today  5. LBBB.    Will plan follow up in one year.

## 2022-10-30 ENCOUNTER — Inpatient Hospital Stay: Payer: Medicare PPO | Attending: Oncology

## 2022-10-30 ENCOUNTER — Inpatient Hospital Stay: Payer: Medicare PPO | Admitting: Oncology

## 2022-10-30 VITALS — BP 147/62 | HR 87 | Temp 98.1°F | Resp 19 | Ht 61.0 in | Wt 179.2 lb

## 2022-10-30 DIAGNOSIS — C9110 Chronic lymphocytic leukemia of B-cell type not having achieved remission: Secondary | ICD-10-CM | POA: Diagnosis not present

## 2022-10-30 DIAGNOSIS — I10 Essential (primary) hypertension: Secondary | ICD-10-CM | POA: Insufficient documentation

## 2022-10-30 DIAGNOSIS — Z86006 Personal history of melanoma in-situ: Secondary | ICD-10-CM | POA: Diagnosis not present

## 2022-10-30 DIAGNOSIS — F419 Anxiety disorder, unspecified: Secondary | ICD-10-CM | POA: Diagnosis not present

## 2022-10-30 DIAGNOSIS — Z806 Family history of leukemia: Secondary | ICD-10-CM | POA: Insufficient documentation

## 2022-10-30 DIAGNOSIS — J45909 Unspecified asthma, uncomplicated: Secondary | ICD-10-CM | POA: Diagnosis not present

## 2022-10-30 LAB — CBC WITH DIFFERENTIAL (CANCER CENTER ONLY)
Abs Immature Granulocytes: 0.14 10*3/uL — ABNORMAL HIGH (ref 0.00–0.07)
Basophils Absolute: 0.1 10*3/uL (ref 0.0–0.1)
Basophils Relative: 0 %
Eosinophils Absolute: 0.2 10*3/uL (ref 0.0–0.5)
Eosinophils Relative: 1 %
HCT: 43.3 % (ref 36.0–46.0)
Hemoglobin: 14.6 g/dL (ref 12.0–15.0)
Immature Granulocytes: 1 %
Lymphocytes Relative: 55 %
Lymphs Abs: 12.4 10*3/uL — ABNORMAL HIGH (ref 0.7–4.0)
MCH: 31 pg (ref 26.0–34.0)
MCHC: 33.7 g/dL (ref 30.0–36.0)
MCV: 91.9 fL (ref 80.0–100.0)
Monocytes Absolute: 1 10*3/uL (ref 0.1–1.0)
Monocytes Relative: 4 %
Neutro Abs: 8.7 10*3/uL — ABNORMAL HIGH (ref 1.7–7.7)
Neutrophils Relative %: 39 %
Platelet Count: 415 10*3/uL — ABNORMAL HIGH (ref 150–400)
RBC: 4.71 MIL/uL (ref 3.87–5.11)
RDW: 12.2 % (ref 11.5–15.5)
WBC Count: 22.4 10*3/uL — ABNORMAL HIGH (ref 4.0–10.5)
nRBC: 0 % (ref 0.0–0.2)

## 2022-10-30 NOTE — Progress Notes (Signed)
  [facility] OFFICE PROGRESS NOTE   Diagnosis: CLL  INTERVAL HISTORY:   Janet [name] returns as scheduled.  She feels well.  Good appetite.  No fever or night sweats.  No recent infection.  No change in palpable lymph nodes.  Objective:  Vital signs in last 24 hours:  Blood pressure (!) 147/62, pulse 87, temperature 98.1 F (36.7 C), temperature source Oral, resp. rate 19, height  (1.549 m), weight 179 lb 3.2 oz (81.3 kg), SpO2 98 %.    Lymphatics: 1 cm right scalene node, pea-sized right posterior cervical node, approximate 2 cm high bilateral axillary nodes, no inguinal nodes Resp: Lungs clear bilaterally Cardio: Regular rate and rhythm GI: No hepatosplenomegaly Vascular: No leg edema   Lab Results:  Lab Results  Component Value Date   WBC 22.4 (H) 10/30/2022   HGB 14.6 10/30/2022   HCT 43.3 10/30/2022   MCV 91.9 10/30/2022   PLT 415 (H) 10/30/2022   NEUTROABS 8.7 (H) 10/30/2022    CMP  Lab Results  Component Value Date   NA 138 06/09/2018   K 4.7 06/09/2018   CL 96 06/09/2018   CO2 25 06/09/2018   GLUCOSE 119 (H) 06/09/2018   BUN 15 06/09/2018   CREATININE 0.78 06/09/2018   CALCIUM 9.7 06/09/2018   GFRNONAA 78 06/09/2018   GFRAA 90 06/09/2018    No results found for: "CEA1", "CEA", "ZOX096", "CA125"  No results found for: "INR", "LABPROT"  Imaging:  No results found.  Medications: I have reviewed the patient's current medications.   Assessment/Plan: CLL Peripheral blood flow cytometry 05/07/2022-CD5, CD19, CD20, CD200, and kappa positive clonal B-cell population consistent with CLL FISH-no 17p13 deletion, deletion of ATM (11q22.3) and 13q14.3 Normal immunoglobulin levels and negative serum immunofixation 05/04/2022 Asthma Hypertension History of melanoma in situ History of hypertrophic cardiomyopathy Family history of CLL (paternal cousin) Anxiety 8.   Right breast mass Mammogram 05/09/2022-possible right breast mass,  bilateral axillary lymphadenopathy Diagnostic right mammogram and right ultrasound 05/17/2022-persistent oval mass in the lower outer right breast, bulky right axillary adenopathy, targeted ultrasound-7 mm oval right breast mass at 9:00 compatible with an abnormal appearing intramammary lymph node, multiple enlarged right axillary nodes 05/22/2022-right breast and axillary needle core biopsies-CLL, CD20, CD5, and CD200 positive 05/22/2022-flow cytometry on the breast and axillary lymph node biopsies combined revealed a monoclonal kappa restricted B-cell population with expression of CD20, CD5, and CD200.  CD38 positive       Disposition: Janet [name] has CLL.  The lymphocyte count is slightly higher today.  She is otherwise stable from a hematologic standpoint.  She appears asymptomatic from the CLL.  There is no indication for treating CLL at present.  The plan is to continue observation.  She will return for an office and lab visit in 4 months.  She will call for new symptoms.  Thornton Papas, MD  10/30/2022  1:31 PM

## 2022-11-05 ENCOUNTER — Ambulatory Visit (HOSPITAL_COMMUNITY): Payer: Medicare PPO | Attending: Cardiology

## 2022-11-05 DIAGNOSIS — Z9889 Other specified postprocedural states: Secondary | ICD-10-CM | POA: Diagnosis not present

## 2022-11-05 DIAGNOSIS — I447 Left bundle-branch block, unspecified: Secondary | ICD-10-CM | POA: Diagnosis not present

## 2022-11-05 DIAGNOSIS — R0609 Other forms of dyspnea: Secondary | ICD-10-CM

## 2022-11-05 DIAGNOSIS — I10 Essential (primary) hypertension: Secondary | ICD-10-CM | POA: Diagnosis not present

## 2022-11-05 DIAGNOSIS — I421 Obstructive hypertrophic cardiomyopathy: Secondary | ICD-10-CM | POA: Insufficient documentation

## 2022-11-05 LAB — ECHOCARDIOGRAM COMPLETE
Area-P 1/2: 2.55 cm2
MV VTI: 2.33 cm2
S' Lateral: 2.8 cm

## 2022-11-07 ENCOUNTER — Telehealth: Payer: Self-pay | Admitting: Cardiology

## 2022-11-07 MED ORDER — DILTIAZEM HCL ER COATED BEADS 360 MG PO CP24: ORAL_CAPSULE | ORAL | 3 refills | Status: DC

## 2022-11-07 NOTE — Telephone Encounter (Signed)
*  STAT* If patient is at the pharmacy, call can be transferred to refill team.   1. Which medications need to be refilled? (please list name of each medication and dose if known)   diltiazem (CARDIZEM CD) 360 MG 24 hr capsule   2. Which pharmacy/location (including street and city if local pharmacy) is medication to be sent to?  [id number] - [place], [place] - [address]   3. Do they need a 30 day or 90 day supply?  90 day  Patient stated she is running out of this medication and will be going out of town.

## 2022-11-07 NOTE — Telephone Encounter (Signed)
Refills has been sent to the pharmacy. 

## 2022-11-09 ENCOUNTER — Ambulatory Visit: Payer: Medicare PPO | Admitting: Cardiology

## 2022-12-28 ENCOUNTER — Other Ambulatory Visit: Payer: Self-pay | Admitting: Internal Medicine

## 2022-12-28 ENCOUNTER — Ambulatory Visit
Admission: RE | Admit: 2022-12-28 | Discharge: 2022-12-28 | Disposition: A | Discharge status: Home / Self Care | Payer: Medicare PPO | Source: Ambulatory Visit | Attending: Internal Medicine | Admitting: Internal Medicine

## 2022-12-28 DIAGNOSIS — I421 Obstructive hypertrophic cardiomyopathy: Secondary | ICD-10-CM | POA: Diagnosis not present

## 2022-12-28 DIAGNOSIS — F339 Major depressive disorder, recurrent, unspecified: Secondary | ICD-10-CM | POA: Diagnosis not present

## 2022-12-28 DIAGNOSIS — M25552 Pain in left hip: Secondary | ICD-10-CM

## 2022-12-28 DIAGNOSIS — C9110 Chronic lymphocytic leukemia of B-cell type not having achieved remission: Secondary | ICD-10-CM | POA: Diagnosis not present

## 2023-02-07 ENCOUNTER — Other Ambulatory Visit (HOSPITAL_BASED_OUTPATIENT_CLINIC_OR_DEPARTMENT_OTHER): Payer: Self-pay

## 2023-02-07 ENCOUNTER — Emergency Department (HOSPITAL_BASED_OUTPATIENT_CLINIC_OR_DEPARTMENT_OTHER): Payer: Medicare PPO

## 2023-02-07 ENCOUNTER — Emergency Department (HOSPITAL_BASED_OUTPATIENT_CLINIC_OR_DEPARTMENT_OTHER)
Admission: EM | Admit: 2023-02-07 | Discharge: 2023-02-07 | Disposition: A | Discharge status: Home / Self Care | Payer: Medicare PPO | Attending: Emergency Medicine | Admitting: Emergency Medicine

## 2023-02-07 ENCOUNTER — Encounter (HOSPITAL_BASED_OUTPATIENT_CLINIC_OR_DEPARTMENT_OTHER): Payer: Self-pay | Admitting: Emergency Medicine

## 2023-02-07 DIAGNOSIS — J189 Pneumonia, unspecified organism: Secondary | ICD-10-CM | POA: Diagnosis not present

## 2023-02-07 DIAGNOSIS — I421 Obstructive hypertrophic cardiomyopathy: Secondary | ICD-10-CM | POA: Insufficient documentation

## 2023-02-07 DIAGNOSIS — Z1152 Encounter for screening for COVID-19: Secondary | ICD-10-CM | POA: Insufficient documentation

## 2023-02-07 DIAGNOSIS — R739 Hyperglycemia, unspecified: Secondary | ICD-10-CM | POA: Diagnosis not present

## 2023-02-07 DIAGNOSIS — R509 Fever, unspecified: Secondary | ICD-10-CM | POA: Diagnosis not present

## 2023-02-07 DIAGNOSIS — C9110 Chronic lymphocytic leukemia of B-cell type not having achieved remission: Secondary | ICD-10-CM | POA: Diagnosis not present

## 2023-02-07 DIAGNOSIS — Z7982 Long term (current) use of aspirin: Secondary | ICD-10-CM | POA: Insufficient documentation

## 2023-02-07 DIAGNOSIS — R059 Cough, unspecified: Secondary | ICD-10-CM | POA: Diagnosis not present

## 2023-02-07 DIAGNOSIS — R918 Other nonspecific abnormal finding of lung field: Secondary | ICD-10-CM | POA: Diagnosis not present

## 2023-02-07 DIAGNOSIS — D72829 Elevated white blood cell count, unspecified: Secondary | ICD-10-CM | POA: Insufficient documentation

## 2023-02-07 LAB — CBC WITH DIFFERENTIAL/PLATELET
Abs Immature Granulocytes: 0.13 10*3/uL — ABNORMAL HIGH (ref 0.00–0.07)
Basophils Absolute: 0.1 10*3/uL (ref 0.0–0.1)
Basophils Relative: 0 %
Eosinophils Absolute: 0 10*3/uL (ref 0.0–0.5)
Eosinophils Relative: 0 %
HCT: 39.8 % (ref 36.0–46.0)
Hemoglobin: 13.6 g/dL (ref 12.0–15.0)
Immature Granulocytes: 0 %
Lymphocytes Relative: 52 %
Lymphs Abs: 16 10*3/uL — ABNORMAL HIGH (ref 0.7–4.0)
MCH: 31.3 pg (ref 26.0–34.0)
MCHC: 34.2 g/dL (ref 30.0–36.0)
MCV: 91.7 fL (ref 80.0–100.0)
Monocytes Absolute: 1.3 10*3/uL — ABNORMAL HIGH (ref 0.1–1.0)
Monocytes Relative: 4 %
Neutro Abs: 13.8 10*3/uL — ABNORMAL HIGH (ref 1.7–7.7)
Neutrophils Relative %: 44 %
Platelets: 363 10*3/uL (ref 150–400)
RBC: 4.34 MIL/uL (ref 3.87–5.11)
RDW: 12.4 % (ref 11.5–15.5)
WBC: 31.3 10*3/uL — ABNORMAL HIGH (ref 4.0–10.5)
nRBC: 0 % (ref 0.0–0.2)

## 2023-02-07 LAB — URINALYSIS, W/ REFLEX TO CULTURE (INFECTION SUSPECTED)
Bacteria, UA: NONE SEEN
Bilirubin Urine: NEGATIVE
Glucose, UA: NEGATIVE mg/dL
Hgb urine dipstick: NEGATIVE
Ketones, ur: NEGATIVE mg/dL
Nitrite: NEGATIVE
Protein, ur: NEGATIVE mg/dL
Specific Gravity, Urine: 1.007 (ref 1.005–1.030)
pH: 6.5 (ref 5.0–8.0)

## 2023-02-07 LAB — COMPREHENSIVE METABOLIC PANEL
ALT: 26 U/L (ref 0–44)
AST: 17 U/L (ref 15–41)
Albumin: 4.2 g/dL (ref 3.5–5.0)
Alkaline Phosphatase: 83 U/L (ref 38–126)
Anion gap: 11 (ref 5–15)
BUN: 11 mg/dL (ref 8–23)
CO2: 26 mmol/L (ref 22–32)
Calcium: 9.2 mg/dL (ref 8.9–10.3)
Chloride: 99 mmol/L (ref 98–111)
Creatinine, Ser: 0.66 mg/dL (ref 0.44–1.00)
GFR, Estimated: >60 mL/min (ref >60)
Glucose, Bld: 162 mg/dL — ABNORMAL HIGH (ref 70–99)
Potassium: 4 mmol/L (ref 3.5–5.1)
Sodium: 136 mmol/L (ref 135–145)
Total Bilirubin: 1.4 mg/dL — ABNORMAL HIGH (ref 0.3–1.2)
Total Protein: 7.4 g/dL (ref 6.5–8.1)

## 2023-02-07 LAB — SARS CORONAVIRUS 2 BY RT PCR: SARS Coronavirus 2 by RT PCR: NEGATIVE

## 2023-02-07 LAB — PROTIME-INR
INR: 1.1 (ref 0.8–1.2)
Prothrombin Time: 14.6 seconds (ref 11.4–15.2)

## 2023-02-07 LAB — APTT: aPTT: 31 seconds (ref 24–36)

## 2023-02-07 LAB — GROUP A STREP BY PCR: Group A Strep by PCR: NOT DETECTED

## 2023-02-07 LAB — LACTIC ACID, PLASMA: Lactic Acid, Venous: 0.8 mmol/L (ref 0.5–1.9)

## 2023-02-07 MED ORDER — LACTATED RINGERS IV BOLUS (SEPSIS)
1000.0000 mL | Freq: Once | INTRAVENOUS | Status: AC
  Administered 2023-02-07: 1000 mL via INTRAVENOUS

## 2023-02-07 MED ORDER — SODIUM CHLORIDE 0.9 % IV SOLN
500.0000 mg | Freq: Once | INTRAVENOUS | Status: AC
  Administered 2023-02-07: 500 mg via INTRAVENOUS
  Filled 2023-02-07: qty 5

## 2023-02-07 MED ORDER — ACETAMINOPHEN 325 MG PO TABS
650.0000 mg | ORAL_TABLET | Freq: Once | ORAL | Status: AC
  Administered 2023-02-07: 650 mg via ORAL
  Filled 2023-02-07: qty 2

## 2023-02-07 MED ORDER — CEFPODOXIME PROXETIL 200 MG PO TABS: 200.0000 mg | ORAL_TABLET | Freq: Two times a day (BID) | ORAL | 0 refills | Status: DC

## 2023-02-07 MED ORDER — SODIUM CHLORIDE 0.9 % IV SOLN
1.0000 g | Freq: Once | INTRAVENOUS | Status: AC
  Administered 2023-02-07: 1 g via INTRAVENOUS
  Filled 2023-02-07: qty 10

## 2023-02-07 MED ORDER — AZITHROMYCIN 250 MG PO TABS: 250.0000 mg | ORAL_TABLET | Freq: Every day | ORAL | 0 refills | Status: DC

## 2023-02-07 NOTE — ED Notes (Signed)
Pt aware of the need for a urine... Unable to currently collect the urine... 

## 2023-02-07 NOTE — ED Notes (Signed)
Discharge paperwork given and verbally understood. 

## 2023-02-07 NOTE — ED Provider Notes (Signed)
[place] [facility] DEPARTMENT AT [facility] Provider Note   CSN: [id number] Arrival date & time: 02/07/23  4098     History  No chief complaint on file.   Janet [name] is a 72 y.o. female.  She has a history of hypertrophic obstructive cardiomyopathy, CLL and follows with Dr. Truett Perna.  Not on any chemotherapy agents.  She has felt sick with URI symptoms, nasal congestion laryngitis dry cough for the last few days.  Last night had a low-grade fever and today had a fever to 101.  Nausea no vomiting.  No chest pain or abdominal pain no urinary symptoms no rashes or swollen joints.  Little bit of loose stool, nonbloody.  Just recently got back from the beach and travel to [place] a few months ago.  No sick contacts.  With her history of CLL she was told to come in if she has any fevers.  2 home COVID test that were negative.  The history is provided by the patient and the spouse.  URI Presenting symptoms: congestion, cough, fatigue, fever and rhinorrhea   Presenting symptoms: no ear pain, no facial pain and no sore throat   Fever:    Duration:  1 day   Max temp prior to arrival:  101   Temp source:  Oral Risk factors: recent travel   Risk factors: no chronic respiratory disease, no diabetes mellitus, no recent illness and no sick contacts        Home Medications Prior to Admission medications   Medication Sig Start Date End Date Taking? Authorizing Provider  aspirin 81 MG tablet Take 1 tablet (81 mg total) by mouth daily. 12/06/17   [place], Peter M, MD  atorvastatin (LIPITOR) 20 MG tablet Take 20 mg by mouth daily.    [provider]  Cholecalciferol (VITAMIN D-3) 125 MCG (5000 UT) TABS Take 1 capsule by mouth daily.    [provider]  Coenzyme Q10 (CO Q-10) 300 MG CAPS Take 300 mg by mouth daily.    [provider]  diltiazem (CARDIZEM CD) 360 MG 24 hr capsule TAKE 1 CAPSULE(360 MG) BY MOUTH DAILY 11/07/22   [place], Peter M, MD   hydrochlorothiazide (MICROZIDE) 12.5 MG capsule TAKE 1 CAPSULE(12.5 MG) BY MOUTH DAILY 07/12/20   [place], Peter M, MD  loratadine (CLARITIN) 10 MG tablet Take 10 mg by mouth daily as needed for allergies.    [provider]  montelukast (SINGULAIR) 10 MG tablet Take 1 tablet (10 mg total) by mouth at bedtime. 01/16/19   Marcelyn Bruins, MD  Multiple Vitamin (MULTIVITAMIN ADULT PO) Take by mouth.    [provider]  Omega-3 Fatty Acids (FISH OIL) 300 MG CAPS Take by mouth.    [provider]  Polyethylene Glycol 3350 (MIRALAX PO) Take 17 g by mouth daily.    [provider]  spironolactone (ALDACTONE) 50 MG tablet TAKE 1 TABLET(50 MG) BY MOUTH DAILY 05/18/22   [place], Peter M, MD  Monte Fantasia INHUB 250-50 MCG/ACT AEPB Inhale 1 puff into the lungs in the morning and at bedtime. 10/09/22   [provider]      Allergies    Irbesartan, Oxycontin [oxycodone], Pneumococcal vaccines, Angiotensin receptor blockers, and Hydrocodone    Review of Systems   Review of Systems  Constitutional:  Positive for fatigue and fever.  HENT:  Positive for congestion and rhinorrhea. Negative for ear pain and sore throat.   Respiratory:  Positive for cough.   Cardiovascular:  Negative  for chest pain.  Gastrointestinal:  Positive for nausea. Negative for abdominal pain.  Genitourinary:  Negative for dysuria.  Musculoskeletal:  Negative for joint swelling.  Skin:  Negative for rash.    Physical Exam Updated Vital Signs BP (!) 157/65   Pulse 99   Temp 99.4 F (37.4 C) (Oral)   Resp 20   Ht 5\' 1"  (1.549 m)   Wt 81.6 kg   SpO2 95%   BMI 34.01 kg/m  Physical Exam Vitals and nursing note reviewed.  Constitutional:      General: She is not in acute distress.    Appearance: Normal appearance. She is well-developed.  HENT:     Head: Normocephalic and atraumatic.  Eyes:     Conjunctiva/sclera: Conjunctivae normal.  Cardiovascular:     Rate and Rhythm: Normal  rate and regular rhythm.     Heart sounds: No murmur heard. Pulmonary:     Effort: Pulmonary effort is normal. No respiratory distress.     Breath sounds: Normal breath sounds.  Abdominal:     Palpations: Abdomen is soft.     Tenderness: There is no abdominal tenderness. There is no guarding or rebound.  Musculoskeletal:        General: No deformity. Normal range of motion.     Cervical back: Neck supple.     Right lower leg: No edema.     Left lower leg: No edema.  Skin:    General: Skin is warm and dry.     Capillary Refill: Capillary refill takes less than 2 seconds.  Neurological:     General: No focal deficit present.     Mental Status: She is alert.     ED Results / Procedures / Treatments   Labs (all labs ordered are listed, but only abnormal results are displayed) Labs Reviewed  COMPREHENSIVE METABOLIC PANEL - Abnormal; Notable for the following components:      Result Value   Glucose, Bld 162 (*)    Total Bilirubin 1.4 (*)    All other components within normal limits  CBC WITH DIFFERENTIAL/PLATELET - Abnormal; Notable for the following components:   WBC 31.3 (*)    Neutro Abs 13.8 (*)    Lymphs Abs 16.0 (*)    Monocytes Absolute 1.3 (*)    Abs Immature Granulocytes 0.13 (*)    All other components within normal limits  URINALYSIS, W/ REFLEX TO CULTURE (INFECTION SUSPECTED) - Abnormal; Notable for the following components:   Leukocytes,Ua SMALL (*)    All other components within normal limits  SARS CORONAVIRUS 2 BY RT PCR  GROUP A STREP BY PCR  CULTURE, BLOOD (ROUTINE X 2)  CULTURE, BLOOD (ROUTINE X 2)  LACTIC ACID, PLASMA  PROTIME-INR  APTT    EKG EKG Interpretation Date/Time:  Thursday February 07 2023 08:57:07 EDT Ventricular Rate:  101 PR Interval:  151 QRS Duration:  143 QT Interval:  386 QTC Calculation: 501 R Axis:   -32  Text Interpretation: Sinus tachycardia Left bundle branch block increased rate from prior 6/17 Confirmed by Meridee Score  [phone]) on 02/07/2023 9:09:29 AM  Radiology DG Chest Port 1 View  Result Date: 02/07/2023 CLINICAL DATA:  Questionable sepsis - evaluate for abnormality EXAM: PORTABLE CHEST 1 VIEW COMPARISON:  CXR 03/12/19 FINDINGS: Status post median sternotomy. No pleural effusion. No pneumothorax. There is a retrocardiac opacity that is new from prior exam. Normal cardiac and mediastinal contours. Radiographically apparent displaced rib fractures. Visualized upper abdomen is unremarkable. IMPRESSION: New retrocardiac  opacity is worrisome for infection. Electronically Signed   By: Lorenza Cambridge M.D.   On: 02/07/2023 09:41    Procedures Procedures    Medications Ordered in ED Medications  lactated ringers bolus 1,000 mL (0 mLs Intravenous Stopped 02/07/23 1120)  cefTRIAXone (ROCEPHIN) 1 g in sodium chloride 0.9 % 100 mL IVPB (0 g Intravenous Stopped 02/07/23 1120)  azithromycin (ZITHROMAX) 500 mg in sodium chloride 0.9 % 250 mL IVPB (0 mg Intravenous Stopped 02/07/23 1158)  acetaminophen (TYLENOL) tablet 650 mg (650 mg Oral Given 02/07/23 1133)    ED Course/ Medical Decision Making/ A&P Clinical Course as of 02/07/23 1748  Thu Feb 07, 2023  0935 Chest x-ray interpreted by me as no acute infiltrate.  Awaiting radiology reading. [MB]  1147 Related in the department, oxygen dropped a little down to 89-90% and heart rate went up.  Not symptomatic. [MB]    Clinical Course User Index [MB] Terrilee Files, MD                                 Medical Decision Making Amount and/or Complexity of Data Reviewed Labs: ordered. Radiology: ordered.  Risk OTC drugs. Prescription drug management.   This patient complains of fatigue fever shortness of breath cough; this involves an extensive number of treatment Options and is a complaint that carries with it a high risk of complications and morbidity. The differential includes pneumonia, viral syndrome, dehydration, sepsis, Sirs  I ordered, reviewed and interpreted  labs, which included CBC with elevated white count, chemistries normal other than elevated glucose, urinalysis without signs of infection, COVID and strep negative, lactate normal I ordered medication IV fluids IV antibiotics and reviewed PMP when indicated. I ordered imaging studies which included chest x-ray and I independently    visualized and interpreted imaging which showed possible retrocardiac infiltrate Additional history obtained from patient's companion Previous records obtained and reviewed in epic including prior oncology notes I consulted oncology Dr. Truett Perna and discussed lab and imaging findings and discussed disposition.  Cardiac monitoring reviewed, normal sinus rhythm Social determinants considered, no significant barriers Critical Interventions: None  After the interventions stated above, I reevaluated the patient and found patient still to be symptomatic with cough although improved from arrival.  Oxygenation is stable. Admission and further testing considered, we discussed admission to the hospital versus home and close outpatient follow-up with oral antibiotics.  She would rather go home and I think this is reasonable.  Recommended close follow-up with her PCP or oncology team.  Return instructions discussed         Final Clinical Impression(s) / ED Diagnoses Final diagnoses:  Community acquired pneumonia, unspecified laterality    Rx / DC Orders ED Discharge Orders          Ordered    cefpodoxime (VANTIN) 200 MG tablet  2 times daily        02/07/23 1221    azithromycin (ZITHROMAX) 250 MG tablet  Daily        02/07/23 1221              Terrilee Files, MD 02/07/23 1750

## 2023-02-07 NOTE — ED Triage Notes (Signed)
Pt arrives pov with visitor, c/o cough, shob with , dizziness x 5 days, fever yesterday RT at bedside, bilaterally clear. Home covid test neg.\

## 2023-02-07 NOTE — Discharge Instructions (Signed)
You were seen in the emergency department for fever fatigue and cough.  Your chest x-ray showed possible pneumonia.  You were given IV antibiotics.  Please start your pill antibiotics tomorrow.  Follow-up with your regular doctor.  Return to the emergency department if any worsening or concerning symptoms.

## 2023-02-07 NOTE — ED Notes (Signed)
ED Provider at bedside. 

## 2023-02-07 NOTE — ED Notes (Addendum)
Pt ambulated with Pulse Ox... No new SOB or pain to note...  HR 106-110 SpO2 89-91% Provider aware.Marland KitchenMarland Kitchen

## 2023-02-12 DIAGNOSIS — R198 Other specified symptoms and signs involving the digestive system and abdomen: Secondary | ICD-10-CM | POA: Diagnosis not present

## 2023-02-12 DIAGNOSIS — Z1211 Encounter for screening for malignant neoplasm of colon: Secondary | ICD-10-CM | POA: Diagnosis not present

## 2023-02-12 DIAGNOSIS — I421 Obstructive hypertrophic cardiomyopathy: Secondary | ICD-10-CM | POA: Diagnosis not present

## 2023-02-12 DIAGNOSIS — J8410 Pulmonary fibrosis, unspecified: Secondary | ICD-10-CM | POA: Diagnosis not present

## 2023-02-12 DIAGNOSIS — J189 Pneumonia, unspecified organism: Secondary | ICD-10-CM | POA: Diagnosis not present

## 2023-02-12 DIAGNOSIS — R051 Acute cough: Secondary | ICD-10-CM | POA: Diagnosis not present

## 2023-02-12 DIAGNOSIS — R739 Hyperglycemia, unspecified: Secondary | ICD-10-CM | POA: Diagnosis not present

## 2023-02-19 DIAGNOSIS — D229 Melanocytic nevi, unspecified: Secondary | ICD-10-CM | POA: Diagnosis not present

## 2023-02-19 DIAGNOSIS — Z86006 Personal history of melanoma in-situ: Secondary | ICD-10-CM | POA: Diagnosis not present

## 2023-02-19 DIAGNOSIS — L814 Other melanin hyperpigmentation: Secondary | ICD-10-CM | POA: Diagnosis not present

## 2023-02-19 DIAGNOSIS — D239 Other benign neoplasm of skin, unspecified: Secondary | ICD-10-CM | POA: Diagnosis not present

## 2023-02-19 DIAGNOSIS — L821 Other seborrheic keratosis: Secondary | ICD-10-CM | POA: Diagnosis not present

## 2023-02-21 DIAGNOSIS — Z01419 Encounter for gynecological examination (general) (routine) without abnormal findings: Secondary | ICD-10-CM | POA: Diagnosis not present

## 2023-02-21 DIAGNOSIS — Z6833 Body mass index (BMI) 33.0-33.9, adult: Secondary | ICD-10-CM | POA: Diagnosis not present

## 2023-02-21 DIAGNOSIS — N840 Polyp of corpus uteri: Secondary | ICD-10-CM | POA: Diagnosis not present

## 2023-02-21 DIAGNOSIS — N841 Polyp of cervix uteri: Secondary | ICD-10-CM | POA: Diagnosis not present

## 2023-02-22 DIAGNOSIS — R194 Change in bowel habit: Secondary | ICD-10-CM | POA: Diagnosis not present

## 2023-03-05 ENCOUNTER — Inpatient Hospital Stay: Payer: Medicare PPO | Attending: Oncology

## 2023-03-05 ENCOUNTER — Inpatient Hospital Stay: Payer: Medicare PPO | Admitting: Oncology

## 2023-03-05 VITALS — BP 148/79 | HR 92 | Temp 98.1°F | Resp 18 | Ht 61.0 in | Wt 177.3 lb

## 2023-03-05 DIAGNOSIS — Z806 Family history of leukemia: Secondary | ICD-10-CM | POA: Diagnosis not present

## 2023-03-05 DIAGNOSIS — J45909 Unspecified asthma, uncomplicated: Secondary | ICD-10-CM | POA: Insufficient documentation

## 2023-03-05 DIAGNOSIS — C9110 Chronic lymphocytic leukemia of B-cell type not having achieved remission: Secondary | ICD-10-CM

## 2023-03-05 DIAGNOSIS — Z86006 Personal history of melanoma in-situ: Secondary | ICD-10-CM | POA: Diagnosis not present

## 2023-03-05 DIAGNOSIS — F419 Anxiety disorder, unspecified: Secondary | ICD-10-CM | POA: Insufficient documentation

## 2023-03-05 DIAGNOSIS — Z8701 Personal history of pneumonia (recurrent): Secondary | ICD-10-CM | POA: Insufficient documentation

## 2023-03-05 DIAGNOSIS — I10 Essential (primary) hypertension: Secondary | ICD-10-CM | POA: Insufficient documentation

## 2023-03-05 LAB — CBC WITH DIFFERENTIAL (CANCER CENTER ONLY)
Abs Immature Granulocytes: 0.09 10*3/uL — ABNORMAL HIGH (ref 0.00–0.07)
Basophils Absolute: 0.1 10*3/uL (ref 0.0–0.1)
Basophils Relative: 0 %
Eosinophils Absolute: 0.2 10*3/uL (ref 0.0–0.5)
Eosinophils Relative: 1 %
HCT: 41.5 % (ref 36.0–46.0)
Hemoglobin: 13.7 g/dL (ref 12.0–15.0)
Immature Granulocytes: 0 %
Lymphocytes Relative: 74 %
Lymphs Abs: 22.6 10*3/uL — ABNORMAL HIGH (ref 0.7–4.0)
MCH: 31.2 pg (ref 26.0–34.0)
MCHC: 33 g/dL (ref 30.0–36.0)
MCV: 94.5 fL (ref 80.0–100.0)
Monocytes Absolute: 1.2 10*3/uL — ABNORMAL HIGH (ref 0.1–1.0)
Monocytes Relative: 4 %
Neutro Abs: 6.2 10*3/uL (ref 1.7–7.7)
Neutrophils Relative %: 21 %
Platelet Count: 356 10*3/uL (ref 150–400)
RBC: 4.39 MIL/uL (ref 3.87–5.11)
RDW: 13 % (ref 11.5–15.5)
WBC Count: 30.4 10*3/uL — ABNORMAL HIGH (ref 4.0–10.5)
nRBC: 0 % (ref 0.0–0.2)

## 2023-03-05 NOTE — Progress Notes (Signed)
  Janet [name] OFFICE PROGRESS NOTE   Diagnosis: CLL  INTERVAL HISTORY:   Janet [name] returns as scheduled.  She was evaluated in the emergency room 02/07/2023 with a fever, cough, and congestion.  A chest x-ray revealed a retrocardiac density.  She was treated with a course of antibiotics.  Her symptoms resolved. She feels well at present.  Good appetite.  No fever or night sweats.  No palpable lymph nodes. She is scheduled for a follow-up chest x-ray later this week. Objective:  Vital signs in last 24 hours:  Blood pressure (!) 148/79, pulse 92, temperature 98.1 F (36.7 C), temperature source Tympanic, resp. rate 18, height 5\' 1"  (1.549 m), weight 177 lb 4.8 oz (80.4 kg), SpO2 100%.    Lymphatics: No cervical or supraclavicular nodes.  2 cm high left axillary node, no right axillary nodes Resp: Lungs clear bilaterally Cardio: Regular rate and rhythm GI: No hepatosplenomegaly Vascular: No leg edema   Lab Results:  Lab Results  Component Value Date   WBC 30.4 (H) 03/05/2023   HGB 13.7 03/05/2023   HCT 41.5 03/05/2023   MCV 94.5 03/05/2023   PLT 356 03/05/2023   NEUTROABS PENDING 03/05/2023    CMP  Lab Results  Component Value Date   NA 136 02/07/2023   K 4.0 02/07/2023   CL 99 02/07/2023   CO2 26 02/07/2023   GLUCOSE 162 (H) 02/07/2023   BUN 11 02/07/2023   CREATININE 0.66 02/07/2023   CALCIUM 9.2 02/07/2023   PROT 7.4 02/07/2023   ALBUMIN 4.2 02/07/2023   AST 17 02/07/2023   ALT 26 02/07/2023   ALKPHOS 83 02/07/2023   BILITOT 1.4 (H) 02/07/2023   GFRNONAA >60 02/07/2023   GFRAA 90 06/09/2018    Medications: I have reviewed the patient's current medications.   Assessment/Plan: CLL Peripheral blood flow cytometry 05/07/2022-CD5, CD19, CD20, CD200, and kappa positive clonal B-cell population consistent with CLL FISH-no 17p13 deletion, deletion of ATM (11q22.3) and 13q14.3 Normal immunoglobulin levels and negative serum immunofixation  05/04/2022 Asthma Hypertension History of melanoma in situ History of hypertrophic cardiomyopathy Family history of CLL (paternal cousin) Anxiety 8.   Right breast mass Mammogram 05/09/2022-possible right breast mass, bilateral axillary lymphadenopathy Diagnostic right mammogram and right ultrasound 05/17/2022-persistent oval mass in the lower outer right breast, bulky right axillary adenopathy, targeted ultrasound-7 mm oval right breast mass at 9:00 compatible with an abnormal appearing intramammary lymph node, multiple enlarged right axillary nodes 05/22/2022-right breast and axillary needle core biopsies-CLL, CD20, CD5, and CD200 positive 05/22/2022-flow cytometry on the breast and axillary lymph node biopsies combined revealed a monoclonal kappa restricted B-cell population with expression of CD20, CD5, and CD200.  CD38 positive 9.  Pneumonia 02/07/2023-treated with azithromycin/cefpodoxime after 1 dose of azithromycin/ceftriaxone in the emergency room    Disposition: Ms. Almas has chronic lymphocytic leukemia.  The white count is higher today.  She is asymptomatic from the CLL.  I recommend continued observation.  She will remain up-to-date on influenza, COVID-19, and pneumonia vaccines.  She will return for an office and lab visit in 4 months.  She will call in the interim for new symptoms.  We will follow-up on the white cell differential from today.  Thornton Papas, MD  03/05/2023  2:42 PM

## 2023-03-06 ENCOUNTER — Telehealth: Payer: Self-pay | Admitting: *Deleted

## 2023-03-06 NOTE — Telephone Encounter (Signed)
Informed Mrs. Kroeze that MD reviewed her diff and said it looks OK. Lymphocytes are slightly higher, but not unexpected. OK to f/u in 4 months.

## 2023-03-08 ENCOUNTER — Other Ambulatory Visit: Payer: Self-pay | Admitting: Internal Medicine

## 2023-03-08 ENCOUNTER — Ambulatory Visit
Admission: RE | Admit: 2023-03-08 | Discharge: 2023-03-08 | Disposition: A | Discharge status: Home / Self Care | Payer: Medicare PPO | Source: Ambulatory Visit | Attending: Internal Medicine | Admitting: Internal Medicine

## 2023-03-08 DIAGNOSIS — J189 Pneumonia, unspecified organism: Secondary | ICD-10-CM

## 2023-03-08 DIAGNOSIS — R7309 Other abnormal glucose: Secondary | ICD-10-CM | POA: Diagnosis not present

## 2023-03-08 DIAGNOSIS — R739 Hyperglycemia, unspecified: Secondary | ICD-10-CM | POA: Diagnosis not present

## 2023-03-13 DIAGNOSIS — R194 Change in bowel habit: Secondary | ICD-10-CM | POA: Diagnosis not present

## 2023-03-13 DIAGNOSIS — K635 Polyp of colon: Secondary | ICD-10-CM | POA: Diagnosis not present

## 2023-03-13 DIAGNOSIS — K5730 Diverticulosis of large intestine without perforation or abscess without bleeding: Secondary | ICD-10-CM | POA: Diagnosis not present

## 2023-03-15 DIAGNOSIS — K635 Polyp of colon: Secondary | ICD-10-CM | POA: Diagnosis not present

## 2023-04-24 ENCOUNTER — Other Ambulatory Visit: Payer: Self-pay | Admitting: Physician Assistant

## 2023-04-24 ENCOUNTER — Encounter: Payer: Self-pay | Admitting: Physician Assistant

## 2023-04-24 DIAGNOSIS — Z0000 Encounter for general adult medical examination without abnormal findings: Secondary | ICD-10-CM

## 2023-05-01 ENCOUNTER — Other Ambulatory Visit: Payer: Self-pay | Admitting: Physician Assistant

## 2023-05-01 ENCOUNTER — Ambulatory Visit
Admission: RE | Admit: 2023-05-01 | Discharge: 2023-05-01 | Disposition: A | Discharge status: Home / Self Care | Payer: Medicare PPO | Source: Ambulatory Visit | Attending: Physician Assistant

## 2023-05-01 DIAGNOSIS — Z23 Encounter for immunization: Secondary | ICD-10-CM | POA: Diagnosis not present

## 2023-05-01 DIAGNOSIS — R051 Acute cough: Secondary | ICD-10-CM | POA: Diagnosis not present

## 2023-05-01 DIAGNOSIS — I10 Essential (primary) hypertension: Secondary | ICD-10-CM | POA: Diagnosis not present

## 2023-05-01 DIAGNOSIS — Z85820 Personal history of malignant melanoma of skin: Secondary | ICD-10-CM | POA: Diagnosis not present

## 2023-05-01 DIAGNOSIS — F324 Major depressive disorder, single episode, in partial remission: Secondary | ICD-10-CM | POA: Diagnosis not present

## 2023-05-01 DIAGNOSIS — R194 Change in bowel habit: Secondary | ICD-10-CM | POA: Diagnosis not present

## 2023-05-01 DIAGNOSIS — I421 Obstructive hypertrophic cardiomyopathy: Secondary | ICD-10-CM | POA: Diagnosis not present

## 2023-05-01 DIAGNOSIS — C9110 Chronic lymphocytic leukemia of B-cell type not having achieved remission: Secondary | ICD-10-CM | POA: Diagnosis not present

## 2023-05-01 DIAGNOSIS — Z0000 Encounter for general adult medical examination without abnormal findings: Secondary | ICD-10-CM | POA: Diagnosis not present

## 2023-05-01 DIAGNOSIS — E782 Mixed hyperlipidemia: Secondary | ICD-10-CM | POA: Diagnosis not present

## 2023-05-01 DIAGNOSIS — J4520 Mild intermittent asthma, uncomplicated: Secondary | ICD-10-CM | POA: Diagnosis not present

## 2023-05-01 DIAGNOSIS — E559 Vitamin D deficiency, unspecified: Secondary | ICD-10-CM | POA: Diagnosis not present

## 2023-05-16 ENCOUNTER — Ambulatory Visit
Admission: RE | Admit: 2023-05-16 | Discharge: 2023-05-16 | Disposition: A | Discharge status: Home / Self Care | Payer: Medicare PPO | Source: Ambulatory Visit | Attending: Physician Assistant | Admitting: Physician Assistant

## 2023-05-16 DIAGNOSIS — Z1231 Encounter for screening mammogram for malignant neoplasm of breast: Secondary | ICD-10-CM | POA: Diagnosis not present

## 2023-05-16 DIAGNOSIS — Z0000 Encounter for general adult medical examination without abnormal findings: Secondary | ICD-10-CM

## 2023-05-22 DIAGNOSIS — H2513 Age-related nuclear cataract, bilateral: Secondary | ICD-10-CM | POA: Diagnosis not present

## 2023-05-22 DIAGNOSIS — H5203 Hypermetropia, bilateral: Secondary | ICD-10-CM | POA: Diagnosis not present

## 2023-05-22 DIAGNOSIS — H43811 Vitreous degeneration, right eye: Secondary | ICD-10-CM | POA: Diagnosis not present

## 2023-07-11 ENCOUNTER — Encounter: Payer: Self-pay | Admitting: *Deleted

## 2023-07-11 ENCOUNTER — Inpatient Hospital Stay: Payer: Medicare PPO

## 2023-07-11 ENCOUNTER — Inpatient Hospital Stay: Payer: Medicare PPO | Attending: Oncology | Admitting: Oncology

## 2023-07-11 ENCOUNTER — Other Ambulatory Visit: Payer: Medicare PPO

## 2023-07-11 VITALS — BP 137/76 | HR 92 | Temp 97.8°F | Resp 18 | Ht 61.0 in | Wt 178.6 lb

## 2023-07-11 DIAGNOSIS — I10 Essential (primary) hypertension: Secondary | ICD-10-CM | POA: Insufficient documentation

## 2023-07-11 DIAGNOSIS — Z806 Family history of leukemia: Secondary | ICD-10-CM | POA: Insufficient documentation

## 2023-07-11 DIAGNOSIS — J45909 Unspecified asthma, uncomplicated: Secondary | ICD-10-CM | POA: Diagnosis not present

## 2023-07-11 DIAGNOSIS — Z85820 Personal history of malignant melanoma of skin: Secondary | ICD-10-CM | POA: Diagnosis not present

## 2023-07-11 DIAGNOSIS — Z8701 Personal history of pneumonia (recurrent): Secondary | ICD-10-CM | POA: Diagnosis not present

## 2023-07-11 DIAGNOSIS — N6315 Unspecified lump in the right breast, overlapping quadrants: Secondary | ICD-10-CM | POA: Insufficient documentation

## 2023-07-11 DIAGNOSIS — C9110 Chronic lymphocytic leukemia of B-cell type not having achieved remission: Secondary | ICD-10-CM | POA: Insufficient documentation

## 2023-07-11 LAB — CBC WITH DIFFERENTIAL (CANCER CENTER ONLY)
Abs Immature Granulocytes: 0 10*3/uL (ref 0.00–0.07)
Basophils Absolute: 0 10*3/uL (ref 0.0–0.1)
Basophils Relative: 0 %
Eosinophils Absolute: 0 10*3/uL (ref 0.0–0.5)
Eosinophils Relative: 0 %
HCT: 43.1 % (ref 36.0–46.0)
Hemoglobin: 14.4 g/dL (ref 12.0–15.0)
Lymphocytes Relative: 79 %
Lymphs Abs: 31.9 10*3/uL — ABNORMAL HIGH (ref 0.7–4.0)
MCH: 31.2 pg (ref 26.0–34.0)
MCHC: 33.4 g/dL (ref 30.0–36.0)
MCV: 93.3 fL (ref 80.0–100.0)
Monocytes Absolute: 0.8 10*3/uL (ref 0.1–1.0)
Monocytes Relative: 2 %
Neutro Abs: 7.7 10*3/uL (ref 1.7–7.7)
Neutrophils Relative %: 19 %
Platelet Count: 389 10*3/uL (ref 150–400)
RBC: 4.62 MIL/uL (ref 3.87–5.11)
RDW: 12.9 % (ref 11.5–15.5)
WBC Count: 40.4 10*3/uL — ABNORMAL HIGH (ref 4.0–10.5)
WBC Morphology: ABNORMAL
nRBC: 0 % (ref 0.0–0.2)

## 2023-07-11 NOTE — Progress Notes (Signed)
  [facility] OFFICE PROGRESS NOTE   Diagnosis: CLL  INTERVAL HISTORY:   Janet [name] returns as scheduled.  She feels well.  No fever, night sweats, or anorexia.  No recurrent infection following the upper respiratory infection in August.  She notes lymph nodes in both axillae.  The left axillary nodes are sometimes painful. A mammogram on 05/16/2023 was negative for malignancy.  There was progressive bilateral axillary lymph node enlargement. Objective:  Vital signs in last 24 hours:  Blood pressure 137/76, pulse 92, temperature 97.8 F (36.6 C), temperature source Temporal, resp. rate 18, height 5' 1 (1.549 m), weight 178 lb 9.6 oz (81 kg), SpO2 99%.    Lymphatics: No cervical, supraclavicular, inguinal, or femoral nodes.  Bilateral soft mobile axillary nodes on the left greater than right measuring up to 3 cm on the left Resp: Lungs clear bilaterally Cardio: Regular rate and rhythm GI: No hepatosplenomegaly, no mass, nontender Vascular: No leg edema Skin: Left arm scars without evidence of tumor recurrence   Lab Results:  Lab Results  Component Value Date   WBC 40.4 (H) 07/11/2023   HGB 14.4 07/11/2023   HCT 43.1 07/11/2023   MCV 93.3 07/11/2023   PLT 389 07/11/2023   NEUTROABS 7.7 07/11/2023    CMP  Lab Results  Component Value Date   NA 136 02/07/2023   K 4.0 02/07/2023   CL 99 02/07/2023   CO2 26 02/07/2023   GLUCOSE 162 (H) 02/07/2023   BUN 11 02/07/2023   CREATININE 0.66 02/07/2023   CALCIUM 9.2 02/07/2023   PROT 7.4 02/07/2023   ALBUMIN 4.2 02/07/2023   AST 17 02/07/2023   ALT 26 02/07/2023   ALKPHOS 83 02/07/2023   BILITOT 1.4 (H) 02/07/2023   GFRNONAA >60 02/07/2023   GFRAA 90 06/09/2018     Medications: I have reviewed the patient's current medications.   Assessment/Plan: CLL Peripheral blood flow cytometry 05/07/2022-CD5, CD19, CD20, CD200, and kappa positive clonal B-cell population consistent with CLL FISH-no 17p13  deletion, deletion of ATM (11q22.3) and 13q14.3 Normal immunoglobulin levels and negative serum immunofixation 05/04/2022 Asthma Hypertension History of melanoma in situ History of hypertrophic cardiomyopathy Family history of CLL (paternal cousin) Anxiety 8.   Right breast mass Mammogram 05/09/2022-possible right breast mass, bilateral axillary lymphadenopathy Diagnostic right mammogram and right ultrasound 05/17/2022-persistent oval mass in the lower outer right breast, bulky right axillary adenopathy, targeted ultrasound-7 mm oval right breast mass at 9:00 compatible with an abnormal appearing intramammary lymph node, multiple enlarged right axillary nodes 05/22/2022-right breast and axillary needle core biopsies-CLL, CD20, CD5, and CD200 positive 05/22/2022-flow cytometry on the breast and axillary lymph node biopsies combined revealed a monoclonal kappa restricted B-cell population with expression of CD20, CD5, and CD200.  CD38 positive 9.  Pneumonia 02/07/2023-treated with azithromycin /cefpodoxime  after 1 dose of azithromycin /ceftriaxone  in the emergency room     Disposition: Janet [name] has chronic lymphocytic leukemia.  The lymphocyte count is slowly rising and she has palpable axillary lymphadenopathy.  There is no indication for treating the CLL at present.  She will return for an office and lab visit in 4 months.  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  I have a low clinical suspicion for recurrent melanoma in the left axilla.  She confirms the area of an atypical melanocytic proliferation at the left arm with a close margin in March 2017 was reexcised.  Arley Hof, MD  07/11/2023  3:10 PM

## 2023-08-19 ENCOUNTER — Telehealth: Payer: Self-pay | Admitting: Cardiology

## 2023-08-19 MED ORDER — DILTIAZEM HCL ER COATED BEADS 360 MG PO CP24: ORAL_CAPSULE | ORAL | 0 refills | Status: DC

## 2023-08-19 NOTE — Telephone Encounter (Signed)
*  STAT* If patient is at the pharmacy, call can be transferred to refill team.   1. Which medications need to be refilled? (please list name of each medication and dose if known)   diltiazem  (CARDIZEM  CD) 360 MG 24 hr capsule    4. Which pharmacy/location (including street and city if local pharmacy) is medication to be sent to?  [facility] [address], [place] - [address] Phone: [phone]  Fax: [phone]        5. Do they need a 30 day or 90 day supply? 90    Pt scheduled for 3/28

## 2023-08-19 NOTE — Telephone Encounter (Signed)
 Pt's medication was sent to pt's pharmacy as requested. Confirmation received.

## 2023-09-24 DIAGNOSIS — D225 Melanocytic nevi of trunk: Secondary | ICD-10-CM | POA: Diagnosis not present

## 2023-09-24 DIAGNOSIS — L578 Other skin changes due to chronic exposure to nonionizing radiation: Secondary | ICD-10-CM | POA: Diagnosis not present

## 2023-09-24 DIAGNOSIS — Z85820 Personal history of malignant melanoma of skin: Secondary | ICD-10-CM | POA: Diagnosis not present

## 2023-09-24 DIAGNOSIS — Z86018 Personal history of other benign neoplasm: Secondary | ICD-10-CM | POA: Diagnosis not present

## 2023-09-24 DIAGNOSIS — L814 Other melanin hyperpigmentation: Secondary | ICD-10-CM | POA: Diagnosis not present

## 2023-09-24 DIAGNOSIS — L821 Other seborrheic keratosis: Secondary | ICD-10-CM | POA: Diagnosis not present

## 2023-09-30 NOTE — Progress Notes (Unsigned)
 Janet [name] Date of Birth: 06-25-1951 Medical Record #[id number]  History of Present Illness: Janet [name] is seen for follow HOCM. She is  s/p septal myectomy and MV repair at [facility] on March 09, 2014. This was performed for HOCM with severe LV outflow gradient as well as MV prolapse with severe posterior MV prolapse and severe MR. She had extended septal myectomy with triangular resection of the posterior MV leaflet with a C shaped annuloplasty ring. She did very well in the post op period. No Afib. She did develop a post op LBBB.  Follow up Echo in December 2015 showed no LVOT gradient and only trivial MR. Repeat Echo in July 2017 was stable. Last Echo in Feb. 2021 showed an excellent repair. Normal EF. Mild AI. Moderate LVH  Reports some reactive airway disease on beta blocker in the past even with selective beta blocker.   She is doing well on follow up. Breathing is ok but still notes shortness of breath when going up hill or steps. Weight is down 4 lbs. No edema. Has been recommended a different inhaler but she hasn't started yet.   She denies any chest pain, palpitations, or edema. No dizziness. She has a grandchild in [place]. She has been diagnosed with low grade CLL. Followed by Dr Truett Perna.   Current Outpatient Medications on File Prior to Visit  Medication Sig Dispense Refill   amoxicillin (AMOXIL) 500 MG capsule Take 2,000 mg by mouth as directed. Take #4 500 mg pre dental procedures (Patient not taking: Reported on 07/11/2023)     aspirin 81 MG tablet Take 1 tablet (81 mg total) by mouth daily. 30 tablet    atorvastatin (LIPITOR) 20 MG tablet Take 20 mg by mouth daily.     Cholecalciferol (VITAMIN D-3) 125 MCG (5000 UT) TABS Take 1 capsule by mouth daily.     Coenzyme Q10 (CO Q-10) 300 MG CAPS Take 300 mg by mouth daily.     diltiazem (CARDIZEM CD) 360 MG 24 hr capsule TAKE 1 CAPSULE(360 MG) BY MOUTH DAILY 90 capsule 0   hydrochlorothiazide (MICROZIDE) 12.5 MG capsule TAKE 1  CAPSULE(12.5 MG) BY MOUTH DAILY 90 capsule 3   loratadine (CLARITIN) 10 MG tablet Take 10 mg by mouth daily as needed for allergies.     montelukast (SINGULAIR) 10 MG tablet Take 1 tablet (10 mg total) by mouth at bedtime. 90 tablet 0   Multiple Vitamin (MULTIVITAMIN ADULT PO) Take by mouth.     Omega-3 Fatty Acids (FISH OIL) 300 MG CAPS Take by mouth.     Polyethylene Glycol 3350 (MIRALAX PO) Take 17 g by mouth daily.     spironolactone (ALDACTONE) 50 MG tablet TAKE 1 TABLET(50 MG) BY MOUTH DAILY 90 tablet 3   Wheat Dextrin (BENEFIBER) POWD Take 10 mLs by mouth daily at 6 (six) AM.     WIXELA INHUB 250-50 MCG/ACT AEPB Inhale 1 puff into the lungs in the morning and at bedtime.     No current facility-administered medications on file prior to visit.    Allergies  Allergen Reactions   Irbesartan Swelling    Swelling of lips    Oxycontin [Oxycodone] Hives   Pneumococcal Vaccines Swelling   Angiotensin Receptor Blockers Other (See Comments)    Angioedema    Hydrocodone Nausea And Vomiting    Past Medical History:  Diagnosis Date   Abrasion of elbow, right 12/29/2015   Anxiety    Asthma    daily and prn inhalers  Breast discharge    right bloody x 2 months   High cholesterol    takes lipitor   HOCM (hypertrophic obstructive cardiomyopathy) (HCC)    Hypertension    states under control with med., has been on med. x "years"   Left bundle branch block (LBBB)    Leg skin lesion, left    mole removed 12/27/2015   Melanoma (HCC)    Nipple lesion 12/2015   right nipple mass   P[place]-Turner syndrome 02/02/2016   right   PONV (postoperative nausea and vomiting)     Past Surgical History:  Procedure Laterality Date   APPENDECTOMY     age 15   BREAST BIOPSY Right 05/22/2022   [place] RT BREAST BX W LOC DEV 1ST LESION IMG BX SPEC US GUIDE 05/22/2022 [facility]   BREAST DUCTAL SYSTEM EXCISION Right 02/19/2017   Procedure: RIGHT BREAST CENTRAL DUCT EXCISION;  Surgeon: Harriette Bouillon, MD;  Location: [facility];  Service: General;  Laterality: Right;   BREAST EXCISIONAL BIOPSY Right 2017   CARDIAC SURGERY  03/09/2014   septal myectomy   HYSTEROSCOPY WITH D & C  07/21/2004   submucosal myomectomy   INFERIOR OBLIQUE MYECTOMY  2015   MASS EXCISION Right 01/04/2016   Procedure: EXCISION OF 1CM RIGHT NIPPLE MASS;  Surgeon: Harriette Bouillon, MD;  Location: [facility];  Service: General;  Laterality: Right;   MITRAL VALVE REPAIR  03/09/2014   NAILBED REPAIR Right 10/14/2012   Procedure: BIOPSY NAIL MATRIX RIGHT THUMB;  Surgeon: Nicki Reaper, MD;  Location: [facility];  Service: Orthopedics;  Laterality: Right;   SALPINGOOPHORECTOMY  2017   TEE WITHOUT CARDIOVERSION N/A 11/10/2013   Procedure: TRANSESOPHAGEAL ECHOCARDIOGRAM (TEE);  Surgeon: Lars Masson, MD;  [facility];  Service: Cardiovascular;  Laterality: N/A;    Social History   Tobacco Use  Smoking Status Former   Current packs/day: 0.00   Types: Cigarettes   Quit date: 07/08/1986   Years since quitting: 37.2  Smokeless Tobacco Never    Social History   Substance and Sexual Activity  Alcohol Use No    Family History  Problem Relation Age of Onset   Arrhythmia Father        A fib   Hypertension Father    Arrhythmia Brother        A fib   Heart disease Brother        SBE, MV repair   Breast cancer Maternal Grandmother    Breast cancer Mother    Hypertension Mother     Review of Systems: As noted in HPI.  All other systems were reviewed and are negative.  Physical Exam: There were no vitals taken for this visit.  GENERAL:  Well appearing WF in NAD HEENT:  PERRL, EOMI, sclera are clear. Oropharynx is clear. NECK:  No jugular venous distention, carotid upstroke brisk and symmetric, no bruits, no thyromegaly or adenopathy LUNGS:  Clear to auscultation bilaterally CHEST:  Unremarkable HEART:  RRR,  PMI not displaced or sustained,S1 and S2  within normal limits, no S3, no S4: no clicks, no rubs, soft systolic murmur RUSB ABD:  Soft, nontender. BS +, no masses or bruits. No hepatomegaly, no splenomegaly EXT:  2 + pulses throughout, no edema, no cyanosis no clubbing SKIN:  Warm and dry.  No rashes NEURO:  Alert and oriented x 3. Cranial nerves II through XII intact. PSYCH:  Cognitively intact    LABORATORY DATA:  Lab Results  Component Value Date   WBC 40.4 (H) 07/11/2023   HGB 14.4 07/11/2023   HCT 43.1 07/11/2023   PLT 389 07/11/2023   GLUCOSE 162 (H) 02/07/2023   ALT 26 02/07/2023   AST 17 02/07/2023   NA 136 02/07/2023   K 4.0 02/07/2023   CL 99 02/07/2023   CREATININE 0.66 02/07/2023   BUN 11 02/07/2023   CO2 26 02/07/2023   INR 1.1 02/07/2023   HGBA1C 5.8 (H) 02/02/2016   Labs dated 11/03/15: cholesterol 159, triglycerides 155, LDL 78, HDL 50.  Dated 11/23/16: cholesterol 156, triglycerides 158, HDL 51, LDL 74. A1c 6%.  Dated 12/17/17: cholesterol 161, triglycerides 146, HDL 49, LDL 83. A1c 6.2%. Hgb 13.6. ALT and TSH normal. Dated 03/12/19: cholesterol 160, triglycerides 152, HDL 51, LDL 78. CMET, CBC, TSH normal.  Dated 04/27/19: A1c 6.2%  Dated 02/29/20: cholesterol 157, triglycerides 175, HDL 59, LDL 69. CBC and CMET normal. Dated 03/06/21: cholesterol 169, triglycerides 206, HDL 53, LDL 81.  Glucose 108, otherwise CBC, CMET and TSH normal Dated 03/08/22: cholesterol 165, triglycerides 159, HDL 53, LDL 84. CMET and TSH normal.  Dated 05/01/23: cholesterol 176, triglycerides 184, HDL 51, LDL 94. A1c 6.1%. CMET normal    Ecg today shows NSR with first degree AV block. rate 74. LBBB.   I have personally reviewed and interpreted this study.   Echo 01/31/16: Study Conclusions   - Left ventricle: The cavity size was normal. There was mild   concentric hypertrophy. Systolic function was normal. The   estimated ejection fraction was in the range of 60% to 65%. No   VSD. Wall motion was normal; there were no  regional wall motion   abnormalities. Doppler parameters are consistent with abnormal   left ventricular relaxation (grade 1 diastolic dysfunction). - Mitral valve: Post mitral valve repair post myomectomy. Mobility   was restricted. - Left atrium: The atrium was mildly dilated. - Pulmonic valve: There was moderate regurgitation. - Pulmonary arteries: Systolic pressure was mildly increased. PA   peak pressure: 33 mm Hg (S).  Echo 08/31/19: IMPRESSIONS     1. Left ventricular ejection fraction, by estimation, is 60 to 65%. The  left ventricle has normal function. The left ventricle has no regional  wall motion abnormalities. There is moderate concentric left ventricular  hypertrophy. Left ventricular  diastolic parameters are consistent with Grade I diastolic dysfunction  (impaired relaxation). Elevated left ventricular end-diastolic pressure.   2. Right ventricular systolic function is normal. The right ventricular  size is normal. There is mildly elevated pulmonary artery systolic  pressure.   3. The mitral valve has been repaired/replaced. Trivial mitral valve  regurgitation. No evidence of mitral stenosis.   4. Aortic regurgitation appears trivial to mild. However PHT consistent  with moderate aortic regurgitation. . The aortic valve is normal in  structure. Aortic valve regurgitation is mild. No aortic stenosis is  present. Aortic regurgitation PHT measures  320 msec.   5. The inferior vena cava is normal in size with greater than 50%  respiratory variability, suggesting right atrial pressure of 3 mmHg.   Echo 11/05/22: IMPRESSIONS     1. S/p mitral valve repair. Procedure Date: 03/09/2014.   2. Left ventricular ejection fraction, by estimation, is 60 to 65%. The  left ventricle has normal function. The left ventricle has no regional  wall motion abnormalities. Left ventricular diastolic parameters are  indeterminate.   3. Right ventricular systolic function is normal. The  right ventricular  size is normal.   4. The aortic valve is tricuspid. Aortic valve regurgitation is not  visualized. No aortic stenosis is present.   5. The inferior vena cava is normal in size with greater than 50%  respiratory variability, suggesting right atrial pressure of 3 mmHg.   6. Left atrial size was moderately dilated.   Assessment / Plan:  1. MV prolapse with severe mitral insufficiency.Now s/p posterior MV leaflet repair in 2015. Trivial residual insufficiency by prior Echo. Soft murmur on exam. Will update Echo. Continue ASA daily.   2. HOCM s/p septal myectomy in 2015. No residual gradient. Last Echo looked good.  She does have some SOB on exertion. Will update Echo. If stable would encourage her to try the inhaler   3. PVCs infrequent. Not a candidate for beta blocker due to reactive airway disease. Avoid stimulants.   4.  HTN - well controlled today  5. LBBB.    Will plan follow up in one year.

## 2023-10-04 ENCOUNTER — Ambulatory Visit: Payer: Medicare PPO | Attending: Cardiology | Admitting: Cardiology

## 2023-10-04 ENCOUNTER — Encounter: Payer: Self-pay | Admitting: Cardiology

## 2023-10-04 VITALS — BP 124/72 | HR 84 | Ht 60.5 in | Wt 179.4 lb

## 2023-10-04 DIAGNOSIS — I421 Obstructive hypertrophic cardiomyopathy: Secondary | ICD-10-CM

## 2023-10-04 DIAGNOSIS — I447 Left bundle-branch block, unspecified: Secondary | ICD-10-CM

## 2023-10-04 DIAGNOSIS — Z9889 Other specified postprocedural states: Secondary | ICD-10-CM

## 2023-10-04 DIAGNOSIS — R0609 Other forms of dyspnea: Secondary | ICD-10-CM | POA: Diagnosis not present

## 2023-10-04 NOTE — Patient Instructions (Signed)
 Medication Instructions:  Continue same medications *If you need a refill on your cardiac medications before your next appointment, please call your pharmacy*  Lab Work: None ordered  Testing/Procedures: Coronary Calcium Score  Follow-Up: At [facility], you and your health needs are our priority.  As part of our continuing mission to provide you with exceptional heart care, our providers are all part of one team.  This team includes your primary Cardiologist (physician) and Advanced Practice Providers or APPs (Physician Assistants and Nurse Practitioners) who all work together to provide you with the care you need, when you need it.  Your next appointment:  1 year   Call in Dec to schedule March appointment     Provider:  Dr.Jordan   We recommend signing up for the patient portal called "MyChart".  Sign up information is provided on this After Visit Summary.  MyChart is used to connect with patients for Virtual Visits (Telemedicine).  Patients are able to view lab/test results, encounter notes, upcoming appointments, etc.  Non-urgent messages can be sent to your provider as well.   To learn more about what you can do with MyChart, go to [email or web address].         1st Floor: - Lobby - Registration  - Pharmacy  - Lab - Cafe  2nd Floor: - PV Lab - Diagnostic Testing (echo, CT, nuclear med)  3rd Floor: - Vacant  4th Floor: - TCTS (cardiothoracic surgery) - AFib Clinic - Structural Heart Clinic - Vascular Surgery  - Vascular Ultrasound  5th Floor: - [facility] (general and EP) - Clinical Pharmacy for coumadin, hypertension, lipid, weight-loss medications, and med management appointments    Valet parking services will be available as well.

## 2023-10-11 ENCOUNTER — Ambulatory Visit (HOSPITAL_COMMUNITY)
Admission: RE | Admit: 2023-10-11 | Discharge: 2023-10-11 | Disposition: A | Discharge status: Home / Self Care | Payer: Self-pay | Source: Ambulatory Visit | Attending: Cardiology | Admitting: Cardiology

## 2023-10-11 DIAGNOSIS — R0609 Other forms of dyspnea: Secondary | ICD-10-CM | POA: Insufficient documentation

## 2023-10-11 DIAGNOSIS — I421 Obstructive hypertrophic cardiomyopathy: Secondary | ICD-10-CM | POA: Insufficient documentation

## 2023-10-11 DIAGNOSIS — I447 Left bundle-branch block, unspecified: Secondary | ICD-10-CM | POA: Insufficient documentation

## 2023-10-11 DIAGNOSIS — Z9889 Other specified postprocedural states: Secondary | ICD-10-CM | POA: Insufficient documentation

## 2023-10-18 DIAGNOSIS — E785 Hyperlipidemia, unspecified: Secondary | ICD-10-CM | POA: Diagnosis not present

## 2023-10-18 DIAGNOSIS — I739 Peripheral vascular disease, unspecified: Secondary | ICD-10-CM | POA: Diagnosis not present

## 2023-10-18 DIAGNOSIS — N189 Chronic kidney disease, unspecified: Secondary | ICD-10-CM | POA: Diagnosis not present

## 2023-10-18 DIAGNOSIS — E669 Obesity, unspecified: Secondary | ICD-10-CM | POA: Diagnosis not present

## 2023-10-18 DIAGNOSIS — J45909 Unspecified asthma, uncomplicated: Secondary | ICD-10-CM | POA: Diagnosis not present

## 2023-10-18 DIAGNOSIS — R32 Unspecified urinary incontinence: Secondary | ICD-10-CM | POA: Diagnosis not present

## 2023-10-18 DIAGNOSIS — I129 Hypertensive chronic kidney disease with stage 1 through stage 4 chronic kidney disease, or unspecified chronic kidney disease: Secondary | ICD-10-CM | POA: Diagnosis not present

## 2023-10-18 DIAGNOSIS — I429 Cardiomyopathy, unspecified: Secondary | ICD-10-CM | POA: Diagnosis not present

## 2023-10-18 DIAGNOSIS — I2510 Atherosclerotic heart disease of native coronary artery without angina pectoris: Secondary | ICD-10-CM | POA: Diagnosis not present

## 2023-10-31 ENCOUNTER — Other Ambulatory Visit: Payer: Self-pay | Admitting: Cardiology

## 2023-11-01 MED ORDER — DILTIAZEM HCL ER COATED BEADS 360 MG PO CP24: ORAL_CAPSULE | ORAL | 0 refills | Status: DC

## 2023-11-04 ENCOUNTER — Other Ambulatory Visit: Payer: Self-pay | Admitting: Cardiology

## 2023-11-06 ENCOUNTER — Other Ambulatory Visit: Payer: Self-pay

## 2023-11-06 MED ORDER — DILTIAZEM HCL ER COATED BEADS 360 MG PO CP24: ORAL_CAPSULE | ORAL | 3 refills | Status: AC

## 2023-11-06 MED ORDER — DILTIAZEM HCL ER COATED BEADS 360 MG PO CP24: ORAL_CAPSULE | ORAL | 0 refills | Status: DC

## 2023-11-12 ENCOUNTER — Telehealth: Payer: Self-pay | Admitting: Oncology

## 2023-11-12 ENCOUNTER — Inpatient Hospital Stay: Payer: Medicare PPO | Attending: Oncology

## 2023-11-12 ENCOUNTER — Inpatient Hospital Stay: Payer: Medicare PPO | Admitting: Oncology

## 2023-11-12 VITALS — BP 118/61 | HR 93 | Temp 98.1°F | Resp 18 | Ht 60.0 in | Wt 182.3 lb

## 2023-11-12 DIAGNOSIS — Z807 Family history of other malignant neoplasms of lymphoid, hematopoietic and related tissues: Secondary | ICD-10-CM | POA: Diagnosis not present

## 2023-11-12 DIAGNOSIS — J45909 Unspecified asthma, uncomplicated: Secondary | ICD-10-CM | POA: Diagnosis not present

## 2023-11-12 DIAGNOSIS — Z8701 Personal history of pneumonia (recurrent): Secondary | ICD-10-CM | POA: Insufficient documentation

## 2023-11-12 DIAGNOSIS — C9110 Chronic lymphocytic leukemia of B-cell type not having achieved remission: Secondary | ICD-10-CM

## 2023-11-12 DIAGNOSIS — F419 Anxiety disorder, unspecified: Secondary | ICD-10-CM | POA: Diagnosis not present

## 2023-11-12 DIAGNOSIS — Z86006 Personal history of melanoma in-situ: Secondary | ICD-10-CM | POA: Insufficient documentation

## 2023-11-12 LAB — CBC WITH DIFFERENTIAL (CANCER CENTER ONLY)
Abs Immature Granulocytes: 0.14 10*3/uL — ABNORMAL HIGH (ref 0.00–0.07)
Basophils Absolute: 0.1 10*3/uL (ref 0.0–0.1)
Basophils Relative: 0 %
Eosinophils Absolute: 0.5 10*3/uL (ref 0.0–0.5)
Eosinophils Relative: 1 %
HCT: 41.4 % (ref 36.0–46.0)
Hemoglobin: 13.7 g/dL (ref 12.0–15.0)
Immature Granulocytes: 0 %
Lymphocytes Relative: 76 %
Lymphs Abs: 34 10*3/uL — ABNORMAL HIGH (ref 0.7–4.0)
MCH: 31.1 pg (ref 26.0–34.0)
MCHC: 33.1 g/dL (ref 30.0–36.0)
MCV: 93.9 fL (ref 80.0–100.0)
Monocytes Absolute: 2 10*3/uL — ABNORMAL HIGH (ref 0.1–1.0)
Monocytes Relative: 5 %
Neutro Abs: 7.8 10*3/uL — ABNORMAL HIGH (ref 1.7–7.7)
Neutrophils Relative %: 18 %
Platelet Count: 354 10*3/uL (ref 150–400)
RBC: 4.41 MIL/uL (ref 3.87–5.11)
RDW: 12.5 % (ref 11.5–15.5)
WBC Count: 44.5 10*3/uL — ABNORMAL HIGH (ref 4.0–10.5)
nRBC: 0 % (ref 0.0–0.2)

## 2023-11-12 NOTE — Progress Notes (Signed)
  [facility] OFFICE PROGRESS NOTE   Diagnosis: CLL  INTERVAL HISTORY:   Janet [name] returns as scheduled.  She feels well.  Good appetite.  No fever.  No consistent night sweats.  She has intermittent discomfort at neck nodes.  No change in the palpable lymphadenopathy.  Objective:  Vital signs in last 24 hours:  Blood pressure 118/61, pulse 93, temperature 98.1 F (36.7 C), temperature source Temporal, resp. rate 18, height 5' (1.524 m), weight 182 lb 4.8 oz (82.7 kg), SpO2 98%.    Lymphatics: Bilateral low cervical/scalene nodes measuring 1-2 cm, 2-3 cm bilateral axillary nodes Resp: Lungs with mild end expiratory wheeze at the left posterior chest, no respiratory distress Cardio: Regular rate and rhythm GI: No hepatosplenomegaly Vascular: No leg edema   Lab Results:  Lab Results  Component Value Date   WBC 44.5 (H) 11/12/2023   HGB 13.7 11/12/2023   HCT 41.4 11/12/2023   MCV 93.9 11/12/2023   PLT 354 11/12/2023   NEUTROABS 7.8 (H) 11/12/2023    CMP  Lab Results  Component Value Date   NA 136 02/07/2023   K 4.0 02/07/2023   CL 99 02/07/2023   CO2 26 02/07/2023   GLUCOSE 162 (H) 02/07/2023   BUN 11 02/07/2023   CREATININE 0.66 02/07/2023   CALCIUM 9.2 02/07/2023   PROT 7.4 02/07/2023   ALBUMIN 4.2 02/07/2023   AST 17 02/07/2023   ALT 26 02/07/2023   ALKPHOS 83 02/07/2023   BILITOT 1.4 (H) 02/07/2023   GFRNONAA >60 02/07/2023   GFRAA 90 06/09/2018     Medications: I have reviewed the patient's current medications.   Assessment/Plan: CLL Peripheral blood flow cytometry 05/07/2022-CD5, CD19, CD20, CD200, and kappa positive clonal B-cell population consistent with CLL FISH-no 17p13 deletion, deletion of ATM (11q22.3) and 13q14.3 Normal immunoglobulin levels and negative serum immunofixation 05/04/2022 Asthma Hypertension History of melanoma in situ History of hypertrophic cardiomyopathy Family history of CLL (paternal  cousin) Anxiety 8.   Right breast mass Mammogram 05/09/2022-possible right breast mass, bilateral axillary lymphadenopathy Diagnostic right mammogram and right ultrasound 05/17/2022-persistent oval mass in the lower outer right breast, bulky right axillary adenopathy, targeted ultrasound-7 mm oval right breast mass at 9:00 compatible with an abnormal appearing intramammary lymph node, multiple enlarged right axillary nodes 05/22/2022-right breast and axillary needle core biopsies-CLL, CD20, CD5, and CD200 positive 05/22/2022-flow cytometry on the breast and axillary lymph node biopsies combined revealed a monoclonal kappa restricted B-cell population with expression of CD20, CD5, and CD200.  CD38 positive 9.  Pneumonia 02/07/2023-treated with azithromycin /cefpodoxime  after 1 dose of azithromycin /ceftriaxone  in the emergency room     Disposition: Janet [name] appears stable.  She has slowly progressive lymphocytosis and stable palpable lymphadenopathy.  She is asymptomatic from CLL.  The hemoglobin and platelet count are normal.  I recommend continuing observation.  She will return for an office and lab visit in 4 months.  Janet Deep, Janet [name]  11/12/2023  11:33 AM

## 2023-11-12 NOTE — Telephone Encounter (Signed)
 Contacted pt to schedule an appt per 11/12/23 LOS. Unable to reach via phone, voicemail was left.     Follow-Up Information  Follow-up disposition: Return for Lab, office.  Check out comments: Office 9/9

## 2023-11-15 ENCOUNTER — Telehealth: Payer: Self-pay | Admitting: Oncology

## 2023-11-15 NOTE — Telephone Encounter (Signed)
 Return call per patient request from voicemail

## 2023-11-19 ENCOUNTER — Other Ambulatory Visit: Payer: Medicare PPO

## 2023-11-19 ENCOUNTER — Ambulatory Visit: Payer: Medicare PPO | Admitting: Oncology

## 2023-11-20 NOTE — Telephone Encounter (Signed)
 Patient has been scheduled for follow-up visit per 11/12/23 LOS. Left message with PT to call back to schedule follow-up appt.

## 2023-12-10 DIAGNOSIS — H5203 Hypermetropia, bilateral: Secondary | ICD-10-CM | POA: Diagnosis not present

## 2023-12-10 DIAGNOSIS — H43811 Vitreous degeneration, right eye: Secondary | ICD-10-CM | POA: Diagnosis not present

## 2023-12-10 DIAGNOSIS — H2513 Age-related nuclear cataract, bilateral: Secondary | ICD-10-CM | POA: Diagnosis not present

## 2024-02-25 DIAGNOSIS — Z124 Encounter for screening for malignant neoplasm of cervix: Secondary | ICD-10-CM | POA: Diagnosis not present

## 2024-02-25 DIAGNOSIS — Z6834 Body mass index (BMI) 34.0-34.9, adult: Secondary | ICD-10-CM | POA: Diagnosis not present

## 2024-03-17 ENCOUNTER — Inpatient Hospital Stay (HOSPITAL_BASED_OUTPATIENT_CLINIC_OR_DEPARTMENT_OTHER): Admitting: Oncology

## 2024-03-17 ENCOUNTER — Ambulatory Visit: Admitting: Oncology

## 2024-03-17 ENCOUNTER — Telehealth: Payer: Self-pay | Admitting: Oncology

## 2024-03-17 ENCOUNTER — Other Ambulatory Visit: Payer: Self-pay

## 2024-03-17 ENCOUNTER — Inpatient Hospital Stay: Attending: Oncology

## 2024-03-17 VITALS — BP 117/87 | HR 80 | Temp 97.3°F | Resp 14 | Wt 179.4 lb

## 2024-03-17 DIAGNOSIS — Z806 Family history of leukemia: Secondary | ICD-10-CM | POA: Diagnosis not present

## 2024-03-17 DIAGNOSIS — C9110 Chronic lymphocytic leukemia of B-cell type not having achieved remission: Secondary | ICD-10-CM

## 2024-03-17 DIAGNOSIS — I10 Essential (primary) hypertension: Secondary | ICD-10-CM | POA: Insufficient documentation

## 2024-03-17 DIAGNOSIS — Z86006 Personal history of melanoma in-situ: Secondary | ICD-10-CM | POA: Insufficient documentation

## 2024-03-17 DIAGNOSIS — F419 Anxiety disorder, unspecified: Secondary | ICD-10-CM | POA: Diagnosis not present

## 2024-03-17 DIAGNOSIS — J45909 Unspecified asthma, uncomplicated: Secondary | ICD-10-CM | POA: Diagnosis not present

## 2024-03-17 DIAGNOSIS — Z8701 Personal history of pneumonia (recurrent): Secondary | ICD-10-CM | POA: Diagnosis not present

## 2024-03-17 DIAGNOSIS — N6315 Unspecified lump in the right breast, overlapping quadrants: Secondary | ICD-10-CM | POA: Diagnosis not present

## 2024-03-17 LAB — CBC WITH DIFFERENTIAL (CANCER CENTER ONLY)
Abs Immature Granulocytes: 0.16 K/uL — ABNORMAL HIGH (ref 0.00–0.07)
Basophils Absolute: 0.1 K/uL (ref 0.0–0.1)
Basophils Relative: 0 %
Eosinophils Absolute: 0.2 K/uL (ref 0.0–0.5)
Eosinophils Relative: 0 %
HCT: 41 % (ref 36.0–46.0)
Hemoglobin: 13.8 g/dL (ref 12.0–15.0)
Immature Granulocytes: 0 %
Lymphocytes Relative: 84 %
Lymphs Abs: 51.2 K/uL — ABNORMAL HIGH (ref 0.7–4.0)
MCH: 31.3 pg (ref 26.0–34.0)
MCHC: 33.7 g/dL (ref 30.0–36.0)
MCV: 93 fL (ref 80.0–100.0)
Monocytes Absolute: 2.4 K/uL — ABNORMAL HIGH (ref 0.1–1.0)
Monocytes Relative: 4 %
Neutro Abs: 7.7 K/uL (ref 1.7–7.7)
Neutrophils Relative %: 12 %
Platelet Count: 350 K/uL (ref 150–400)
RBC: 4.41 MIL/uL (ref 3.87–5.11)
RDW: 13 % (ref 11.5–15.5)
WBC Count: 61.8 K/uL (ref 4.0–10.5)
nRBC: 0 % (ref 0.0–0.2)

## 2024-03-17 NOTE — Progress Notes (Signed)
 CRITICAL VALUE STICKER  CRITICAL VALUE: WBC:  61.8  RECEIVER (on-site recipient of call): Keene Crown, RN  DATE & TIME NOTIFIED:   03/17/2024  1009  MESSENGER (representative from lab):  MD NOTIFIED: Yes, Dr. Cloretta  TIME OF NOTIFICATION: 1014  RESPONSE:  Aware

## 2024-03-17 NOTE — Progress Notes (Signed)
 CRITICAL VALUE STICKER  CRITICAL VALUE: WBC 61.9  RECEIVER (on-site recipient of call): Keene Devonshire  DATE & TIME NOTIFIED: 03/17/24 @ 1059  MESSENGER (representative from lab):  MD NOTIFIED: Dr. Cloretta  TIME OF NOTIFICATION: 1105  RESPONSE:  Seeing patient in office now

## 2024-03-17 NOTE — Progress Notes (Signed)
   [facility] OFF[facility] PROGRESS [facility]   Diagnosis: CLL  INTERVAL HISTORY:   Janet [name] returns as scheduled.  She feels well.  She reports 1 night sweats over the past 4 months.  No fever.  Good appetite.  The axillary lymph nodes wax and wane.  No recent infection.  Objective:  Vital signs in last 24 hours:  Blood pressure 117/87, pulse 80, temperature (!) 97.3 F (36.3 C), temperature source Temporal, resp. rate 14, weight 179 lb 6.4 oz (81.4 kg), SpO2 99%.    HEENT: Oropharynx without visible mass Lymphatics: Soft mobile 1 cm right posterior cervical node, 1 cm left supraclavicular node, bilateral soft mobile 2 cm axillary nodes, no inguinal or femoral nodes Resp: Lungs clear bilaterally Cardio: Regular rate and rhythm GI: No hepatosplenomegaly Vascular: No leg edema   Lab Results:  Lab Results  Component Value Date   WBC 61.8 (HH) 03/17/2024   HGB 13.8 03/17/2024   HCT 41.0 03/17/2024   MCV 93.0 03/17/2024   PLT 350 03/17/2024   NEUTROABS 7.7 03/17/2024    CMP  Lab Results  Component Value Date   NA 136 02/07/2023   K 4.0 02/07/2023   CL 99 02/07/2023   CO2 26 02/07/2023   GLUCOSE 162 (H) 02/07/2023   BUN 11 02/07/2023   CREATININE 0.66 02/07/2023   CALCIUM 9.2 02/07/2023   PROT 7.4 02/07/2023   ALBUMIN 4.2 02/07/2023   AST 17 02/07/2023   ALT 26 02/07/2023   ALKPHOS 83 02/07/2023   BILITOT 1.4 (H) 02/07/2023   GFRNONAA >60 02/07/2023   GFRAA 90 06/09/2018   Medications: I have reviewed the patient's current medications.   Assessment/Plan:  CLL Peripheral blood flow cytometry 05/07/2022-CD5, CD19, CD20, CD200, and kappa positive clonal B-cell population consistent with CLL FISH-no 17p13 deletion, deletion of ATM (11q22.3) and 13q14.3 Normal immunoglobulin levels and negative serum immunofixation 05/04/2022 Asthma Hypertension History of melanoma in situ History of hypertrophic cardiomyopathy Family history of CLL (paternal  cousin) Anxiety 8.   Right breast mass Mammogram 05/09/2022-possible right breast mass, bilateral axillary lymphadenopathy Diagnostic right mammogram and right ultrasound 05/17/2022-persistent oval mass in the lower outer right breast, bulky right axillary adenopathy, targeted ultrasound-7 mm oval right breast mass at 9:00 compatible with an abnormal appearing intramammary lymph node, multiple enlarged right axillary nodes 05/22/2022-right breast and axillary needle core biopsies-CLL, CD20, CD5, and CD200 positive 05/22/2022-flow cytometry on the breast and axillary lymph node biopsies combined revealed a monoclonal kappa restricted B-cell population with expression of CD20, CD5, and CD200.  CD38 positive 9.  Pneumonia 02/07/2023-treated with azithromycin /cefpodoxime  after 1 dose of azithromycin /ceftriaxone  in the emergency room     Disposition: Janet [name] appears stable.  The lymphocyte count continues to rise, but the hemoglobin and platelet count are normal.  She is asymptomatic from the CLL.  The plan is to continue observation.  She will obtain an influenza and COVID-19 vaccine within the next few months.  She will return for an office and lab visit in 3 months.  Arley Hof, MD  03/17/2024  12:04 PM

## 2024-03-17 NOTE — Telephone Encounter (Signed)
 Patient has been scheduled for follow-up visit per 03/17/24 LOS.  Pt noted appt details on personal planner/calendar.

## 2024-03-31 DIAGNOSIS — L814 Other melanin hyperpigmentation: Secondary | ICD-10-CM | POA: Diagnosis not present

## 2024-03-31 DIAGNOSIS — L209 Atopic dermatitis, unspecified: Secondary | ICD-10-CM | POA: Diagnosis not present

## 2024-03-31 DIAGNOSIS — D229 Melanocytic nevi, unspecified: Secondary | ICD-10-CM | POA: Diagnosis not present

## 2024-03-31 DIAGNOSIS — L578 Other skin changes due to chronic exposure to nonionizing radiation: Secondary | ICD-10-CM | POA: Diagnosis not present

## 2024-03-31 DIAGNOSIS — L821 Other seborrheic keratosis: Secondary | ICD-10-CM | POA: Diagnosis not present

## 2024-03-31 DIAGNOSIS — Z86006 Personal history of melanoma in-situ: Secondary | ICD-10-CM | POA: Diagnosis not present

## 2024-04-01 ENCOUNTER — Other Ambulatory Visit: Payer: Self-pay | Admitting: Obstetrics and Gynecology

## 2024-04-01 DIAGNOSIS — Z1231 Encounter for screening mammogram for malignant neoplasm of breast: Secondary | ICD-10-CM

## 2024-04-15 DIAGNOSIS — M7661 Achilles tendinitis, right leg: Secondary | ICD-10-CM | POA: Diagnosis not present

## 2024-04-15 DIAGNOSIS — M7662 Achilles tendinitis, left leg: Secondary | ICD-10-CM | POA: Diagnosis not present

## 2024-04-21 NOTE — Therapy (Signed)
 OUTPATIENT PHYSICAL THERAPY EVALUATION   Patient Name: Janet [name] MRN: [id number] DOB:[birth year], 73 y.o., female Today's Date: 04/22/2024   END OF SESSION:  PT End of Session - 04/22/24 0944     Visit Number 1    Number of Visits 9    Date for Recertification  06/17/24    Authorization Type [employer] MCR    Progress Note Due on Visit 10    PT Start Time 0932    PT Stop Time 1015    PT Time Calculation (min) 43 min    Activity Tolerance Patient tolerated treatment well    Behavior During Therapy [facility] for tasks assessed/performed          Past Medical History:  Diagnosis Date   Abrasion of elbow, right 12/29/2015   Anxiety    Asthma    daily and prn inhalers   Breast discharge    right bloody x 2 months   High cholesterol    takes lipitor   HOCM (hypertrophic obstructive cardiomyopathy) (HCC)    Hypertension    states under control with med., has been on med. x years   Left bundle branch block (LBBB)    Leg skin lesion, left    mole removed 12/27/2015   Melanoma (HCC)    Nipple lesion 12/2015   right nipple mass   Parsonage-Turner syndrome 02/02/2016   right   PONV (postoperative nausea and vomiting)    Past Surgical History:  Procedure Laterality Date   APPENDECTOMY     age 62   BREAST BIOPSY Right 05/22/2022   US  RT BREAST BX W LOC DEV 1ST LESION IMG BX SPEC US  GUIDE 05/22/2022 [facility] MAMMOGRAPHY   BREAST DUCTAL SYSTEM EXCISION Right 02/19/2017   Procedure: RIGHT BREAST CENTRAL DUCT EXCISION;  Surgeon: Vanderbilt Ned, MD;  Location: [facility];  Service: General;  Laterality: Right;   BREAST EXCISIONAL BIOPSY Right 2017   CARDIAC SURGERY  03/09/2014   septal myectomy   HYSTEROSCOPY WITH D & C  07/21/2004   submucosal myomectomy   INFERIOR OBLIQUE MYECTOMY  2015   MASS EXCISION Right 01/04/2016   Procedure: EXCISION OF 1CM RIGHT NIPPLE MASS;  Surgeon: Ned Vanderbilt, MD;  Location: [facility];  Service: General;  Laterality:  Right;   MITRAL VALVE REPAIR  03/09/2014   NAILBED REPAIR Right 10/14/2012   Procedure: BIOPSY NAIL MATRIX RIGHT THUMB;  Surgeon: Arley JONELLE Curia, MD;  Location: [facility];  Service: Orthopedics;  Laterality: Right;   SALPINGOOPHORECTOMY  2017   TEE WITHOUT CARDIOVERSION N/A 11/10/2013   Procedure: TRANSESOPHAGEAL ECHOCARDIOGRAM (TEE);  Surgeon: Leim VEAR Moose, MD;  [facility] END[facility];  Service: Cardiovascular;  Laterality: N/A;   Patient Active Problem List   Diagnosis Date Noted   Parsonage-Turner syndrome 02/02/2016   Hypertrophic obstructive cardiomyopathy (HCC) 12/26/2015   Dyspnea 11/29/2014   Breath shortness 11/29/2014   S/P ventricular septal myectomy 04/06/2014   S/P MVR (mitral valve repair) 04/06/2014   LBBB (left bundle branch block)    HOCM (hypertrophic obstructive cardiomyopathy) (HCC)    Dynamic left ventricular outflow obstruction 11/19/2013   PVC's (premature ventricular contractions)    Mitral insufficiency 11/06/2013   Hypertension    MVP (mitral valve prolapse)    Dysrhythmia     PCP: [facility]  REFERRING PROVIDER: Kit Rush, MD  REFERRING DIAG: Achilles tendinitis, right leg; Achilles tendinitis, left leg  THERAPY DIAG:  Pain in left ankle and joints of  left foot  Pain in right ankle and joints of right foot  Muscle weakness (generalized)  Rationale for Evaluation and Treatment: Rehabilitation  ONSET DATE: 03/23/2024   SUBJECTIVE:  SUBJECTIVE STATEMENT: Patient reports achilles pain on both sides that is worse on the left. She has been on steroids and reduced her walking so is feeling better now, and she is wearing compression socks. She has been having this particular pain for about a month, but had this same pain years ago that just seemed to just get better. She did start walking a couple miles which may have flared up her current pain.  PERTINENT HISTORY: See PMH above  PAIN:  Are you having pain?  Yes:  NPRS scale: 0/10 at rest, 3/10 at worst since seeing the doctor and starting medication Pain location: Bilateral achilles (R>L) Pain description: Sharp Aggravating factors: Walking, going down stairs Relieving factors: Rest, medication, icing  PRECAUTIONS: None  RED FLAGS: None   WEIGHT BEARING RESTRICTIONS: No  FALLS:  Has patient fallen in last 6 months? No  PLOF: Independent  PATIENT GOALS: Improve pain to get back to walking   OBJECTIVE:  Note: Objective measures were completed at Evaluation unless otherwise noted. PATIENT SURVEYS:  PSFS: 4 Walking for exercise: 3 Going down stairs: 5  COGNITION: Overall cognitive status: Within functional limits for tasks assessed     SENSATION: WFL  EDEMA:  None noted  MUSCLE LENGTH: Calf tightness noted bilaterally  POSTURE:   Grossly WFL  PALPATION: Tender to palpation R>L mid portion achilles, non-tender to insertion or calf  LOWER EXTREMITY ROM:  Active ROM Right eval Left eval  Hip flexion    Hip extension    Hip abduction    Hip adduction    Hip internal rotation    Hip external rotation    Knee flexion    Knee extension    Ankle dorsiflexion 8 5  Ankle plantarflexion    Ankle inversion    Ankle eversion     (Blank rows = not tested)  LOWER EXTREMITY MMT:  MMT Right eval Left eval  Hip flexion    Hip extension    Hip abduction    Hip adduction    Hip internal rotation    Hip external rotation    Knee flexion    Knee extension    Ankle dorsiflexion 5 5  Ankle plantarflexion 4 3  Ankle inversion 4 4  Ankle eversion 4 4   (Blank rows = not tested)  FUNCTIONAL TESTS:  Not formally assessed  GAIT: Assistive device utilized: None Level of assistance: Complete Independence Comments: Grossly WFL                                                                                                                               TREATMENT  [facility] Adult PT Treatment:  DATE: 04/22/2024 Longsitting ankle PF with blue 2 x 10 each Calf stretch at counter 2 x 30 sec each Heel raises 2 x 10  Discussed anatomy of achilles tendinopathy and restarting walking program but beginning with less distance and slow/gradual progression  PATIENT EDUCATION:  Education details: Exam findings, POC, HEP Person educated: Patient Education method: Explanation, Demonstration, Tactile cues, Verbal cues, and Handouts Education comprehension: verbalized understanding, returned demonstration, verbal cues required, tactile cues required, and needs further education  HOME EXERCISE PROGRAM: Access Code: [email or web address]    ASSESSMENT: CLINICAL IMPRESSION: Patient is a 73 y.o. female who was seen today for physical therapy evaluation and treatment for bilateral achilles pain that seems consistent with mid portion achilles tendinopathy. She exhibits limitations in her calf flexibility and ankle strength bilaterally that is likely contributing to her pain and decreased activity tolerance.   OBJECTIVE IMPAIRMENTS: decreased activity tolerance, decreased ROM, decreased strength, impaired flexibility, and pain.   ACTIVITY LIMITATIONS: standing, stairs, and locomotion level  PARTICIPATION LIMITATIONS: shopping and community activity  PERSONAL FACTORS: Fitness, Past/current experiences, and Time since onset of injury/illness/exacerbation are also affecting patient's functional outcome.   REHAB POTENTIAL: Good  CLINICAL DECISION MAKING: Stable/uncomplicated  EVALUATION COMPLEXITY: Low   GOALS: Goals reviewed with patient? Yes  SHORT TERM GOALS: Target date: 05/20/2024  Patient will be I with initial HEP in order to progress with therapy. Baseline: HEP provided at eval Goal status: INITIAL  2.  Patient will report bilateral achilles pain </= 1/10 in order to reduce functional limitations Baseline: 3/10 Goal status: INITIAL  LONG TERM GOALS: Target date:  06/17/2024  Patient will be I with final HEP to maintain progress from PT. Baseline: HEP provided at eval Goal status: INITIAL  2.  Patient will report PSFS >/= 8 in order to indicate improvement in their functional ability. Baseline: 4 Goal status: INITIAL  3.  Patient will demonstrate bilateral ankle strength 5/5 MMT in order to improve her walking tolerance Baseline: see limitations above Goal status: INITIAL  4.  Patient will demonstrate bilateral ankle DF >/= 10 deg in order to improve tolerance for going down stairs Baseline: see limitations above Goal status: INITIAL   PLAN: PT FREQUENCY: 1-2x/week  PT DURATION: 8 weeks  PLANNED INTERVENTIONS: 97164- PT Re-evaluation, 97750- Physical Performance Testing, 97110-Therapeutic exercises, 97530- Therapeutic activity, 97112- Neuromuscular re-education, 97535- Self Care, 02859- Manual therapy, [phone]- Gait training, [phone]- Ionotophoresis 4mg /ml Dexamethasone , Patient/Family education, Balance training, Stair training, Taping, Joint mobilization, Joint manipulation, Cryotherapy, and Moist heat  PLAN FOR NEXT SESSION: Review HEP and progress PRN, manual and stretching for calf, progress ankle strength and achilles load tolerance, balance training, incorporate LE and hip strengthening   Elaine Daring, PT, DPT, LAT, ATC 04/22/24  12:41 PM Phone: [phone] Fax: [phone]

## 2024-04-22 ENCOUNTER — Ambulatory Visit: Admitting: Physical Therapy

## 2024-04-22 ENCOUNTER — Encounter: Payer: Self-pay | Admitting: Physical Therapy

## 2024-04-22 ENCOUNTER — Other Ambulatory Visit: Payer: Self-pay

## 2024-04-22 DIAGNOSIS — M25572 Pain in left ankle and joints of left foot: Secondary | ICD-10-CM

## 2024-04-22 DIAGNOSIS — M6281 Muscle weakness (generalized): Secondary | ICD-10-CM

## 2024-04-22 DIAGNOSIS — M25571 Pain in right ankle and joints of right foot: Secondary | ICD-10-CM | POA: Diagnosis not present

## 2024-04-22 NOTE — Patient Instructions (Signed)
 Access Code: [email or web address] URL: [email or web address] Date: 04/22/2024 Prepared by: Elaine Daring  Exercises - Long Sitting Ankle Plantar Flexion with Resistance  - 1 x daily - 3 sets - 10 reps - Standing Gastroc Stretch at Counter  - 1 x daily - 3 reps - 20 seconds hold - Heel Raises with Counter Support  - 1 x daily - 3 sets - 10 reps

## 2024-04-28 DIAGNOSIS — Z13820 Encounter for screening for osteoporosis: Secondary | ICD-10-CM | POA: Diagnosis not present

## 2024-04-28 DIAGNOSIS — J45909 Unspecified asthma, uncomplicated: Secondary | ICD-10-CM | POA: Diagnosis not present

## 2024-04-28 DIAGNOSIS — N958 Other specified menopausal and perimenopausal disorders: Secondary | ICD-10-CM | POA: Diagnosis not present

## 2024-04-29 ENCOUNTER — Ambulatory Visit: Admitting: Physical Therapy

## 2024-04-29 ENCOUNTER — Encounter: Payer: Self-pay | Admitting: Physical Therapy

## 2024-04-29 ENCOUNTER — Other Ambulatory Visit: Payer: Self-pay

## 2024-04-29 DIAGNOSIS — M25571 Pain in right ankle and joints of right foot: Secondary | ICD-10-CM

## 2024-04-29 DIAGNOSIS — M25572 Pain in left ankle and joints of left foot: Secondary | ICD-10-CM | POA: Diagnosis not present

## 2024-04-29 DIAGNOSIS — M6281 Muscle weakness (generalized): Secondary | ICD-10-CM

## 2024-04-29 NOTE — Therapy (Signed)
 OUTPATIENT PHYSICAL THERAPY EVALUATION   Patient Name: Janet [name] MRN: [id number] DOB:12/10/1950, 73 y.o., female Today's Date: 04/29/2024   END OF SESSION:  PT End of Session - 04/29/24 1023     Visit Number 2    Number of Visits 9    Date for Recertification  06/17/24    Authorization Type [employer] MCR    Progress Note Due on Visit 10    PT Start Time 1020    PT Stop Time 1100    PT Time Calculation (min) 40 min    Activity Tolerance Patient tolerated treatment well    Behavior During Therapy WFL for tasks assessed/performed           Past Medical History:  Diagnosis Date   Abrasion of elbow, right 12/29/2015   Anxiety    Asthma    daily and prn inhalers   Breast discharge    right bloody x 2 months   High cholesterol    takes lipitor   HOCM (hypertrophic obstructive cardiomyopathy) (HCC)    Hypertension    states under control with med., has been on med. x years   Left bundle branch block (LBBB)    Leg skin lesion, left    mole removed 12/27/2015   Melanoma (HCC)    Nipple lesion 12/2015   right nipple mass   Parsonage-Turner syndrome 02/02/2016   right   PONV (postoperative nausea and vomiting)    Past Surgical History:  Procedure Laterality Date   APPENDECTOMY     age 47   BREAST BIOPSY Right 05/22/2022   US  RT BREAST BX W LOC DEV 1ST LESION IMG BX SPEC US  GUIDE 05/22/2022 [facility] MAMMOGRAPHY   BREAST DUCTAL SYSTEM EXCISION Right 02/19/2017   Procedure: RIGHT BREAST CENTRAL DUCT EXCISION;  Surgeon: Vanderbilt Ned, MD;  Location: [facility];  Service: General;  Laterality: Right;   BREAST EXCISIONAL BIOPSY Right 2017   CARDIAC SURGERY  03/09/2014   septal myectomy   HYSTEROSCOPY WITH D & C  07/21/2004   submucosal myomectomy   INFERIOR OBLIQUE MYECTOMY  2015   MASS EXCISION Right 01/04/2016   Procedure: EXCISION OF 1CM RIGHT NIPPLE MASS;  Surgeon: Ned Vanderbilt, MD;  Location: [facility];  Service: General;  Laterality:  Right;   MITRAL VALVE REPAIR  03/09/2014   NAILBED REPAIR Right 10/14/2012   Procedure: BIOPSY NAIL MATRIX RIGHT THUMB;  Surgeon: Arley JONELLE Curia, MD;  Location: [facility];  Service: Orthopedics;  Laterality: Right;   SALPINGOOPHORECTOMY  2017   TEE WITHOUT CARDIOVERSION N/A 11/10/2013   Procedure: TRANSESOPHAGEAL ECHOCARDIOGRAM (TEE);  Surgeon: Leim VEAR Moose, MD;  Location: [facility] ENDOSCOPY;  Service: Cardiovascular;  Laterality: N/A;   Patient Active Problem List   Diagnosis Date Noted   Parsonage-Turner syndrome 02/02/2016   Hypertrophic obstructive cardiomyopathy (HCC) 12/26/2015   Dyspnea 11/29/2014   Breath shortness 11/29/2014   S/P ventricular septal myectomy 04/06/2014   S/P MVR (mitral valve repair) 04/06/2014   LBBB (left bundle branch block)    HOCM (hypertrophic obstructive cardiomyopathy) (HCC)    Dynamic left ventricular outflow obstruction 11/19/2013   PVC's (premature ventricular contractions)    Mitral insufficiency 11/06/2013   Hypertension    MVP (mitral valve prolapse)    Dysrhythmia     PCP: [facility]  REFERRING PROVIDER: Kit Rush, MD  REFERRING DIAG: Achilles tendinitis, right leg; Achilles tendinitis, left leg  THERAPY DIAG:  Pain in right ankle and joints  of right foot  Pain in left ankle and joints of left foot  Muscle weakness (generalized)  Rationale for Evaluation and Treatment: Rehabilitation  ONSET DATE: 03/23/2024   SUBJECTIVE:  SUBJECTIVE STATEMENT: Patient reports she is consistent with her exercises and they are going well. She still still feels about the same.   Eval: Patient reports achilles pain on both sides that is worse on the left. She has been on steroids and reduced her walking so is feeling better now, and she is wearing compression socks. She has been having this particular pain for about a month, but had this same pain years ago that just seemed to just get better. She did start walking  a couple miles which may have flared up her current pain.  PERTINENT HISTORY: See PMH above  PAIN:  Are you having pain? Yes:  NPRS scale: 0/10 at rest, 3/10 at worst since seeing the doctor and starting medication Pain location: Bilateral achilles (R>L) Pain description: Sharp Aggravating factors: Walking, going down stairs Relieving factors: Rest, medication, icing  PRECAUTIONS: None  PATIENT GOALS: Improve pain to get back to walking   OBJECTIVE:  Note: Objective measures were completed at Evaluation unless otherwise noted. PATIENT SURVEYS:  PSFS: 4 Walking for exercise: 3 Going down stairs: 5  MUSCLE LENGTH: Calf tightness noted bilaterally  POSTURE:   Grossly WFL  PALPATION: Tender to palpation R>L mid portion achilles, non-tender to insertion or calf  LOWER EXTREMITY ROM:  Active ROM Right eval Left eval  Hip flexion    Hip extension    Hip abduction    Hip adduction    Hip internal rotation    Hip external rotation    Knee flexion    Knee extension    Ankle dorsiflexion 8 5  Ankle plantarflexion    Ankle inversion    Ankle eversion     (Blank rows = not tested)  LOWER EXTREMITY MMT:  MMT Right eval Left eval  Hip flexion    Hip extension    Hip abduction    Hip adduction    Hip internal rotation    Hip external rotation    Knee flexion    Knee extension    Ankle dorsiflexion 5 5  Ankle plantarflexion 4 3  Ankle inversion 4 4  Ankle eversion 4 4   (Blank rows = not tested)  FUNCTIONAL TESTS:  Not formally assessed  04/29/2024: SLS: right 16 seconds, left 22 seconds  GAIT: Assistive device utilized: None Level of assistance: Complete Independence Comments: Grossly WFL                                                                                                                               TREATMENT  [facility] Adult PT Treatment:  DATE: 04/29/2024 Recumbent bike L3 x 5 min to improve  endurance and workload capacity Slant board calf stretch 3 x 30 sec Standing calf raises 2 x 10 Front foot elevated on 8 box SL calf raise 2 x 10 each Seated calf raise with 15# 2 x 15 each Longsitting ankle inversion and eversion with red 2 x 10 each SLS 3 x 30 sec each  Discussed walking program for patient to build up her walking tolerance before her trip  PATIENT EDUCATION:  Education details: HEP update Person educated: Patient Education method: Explanation, Demonstration, Tactile cues, Verbal cues, and Handouts Education comprehension: verbalized understanding, returned demonstration, verbal cues required, tactile cues required, and needs further education  HOME EXERCISE PROGRAM: Access Code: [email or web address]    ASSESSMENT: CLINICAL IMPRESSION: Patient tolerated therapy well with no adverse effects. Therapy focused primarily on strengthening for the calf/achilles and improving ankle control with good tolerance. She denies any achilles pain in therapy, reporting more work of the calf. She was able to progress to more single leg calf strengthening. She did exhibit some difficulty with single leg stance. Updated her HEP to progress calf strengthening and balance. Patient would benefit from continued skilled PT to progress mobility and strength in order to reduce pain and maximize functional ability.   Eval: Patient is a 73 y.o. female who was seen today for physical therapy evaluation and treatment for bilateral achilles pain that seems consistent with mid portion achilles tendinopathy. She exhibits limitations in her calf flexibility and ankle strength bilaterally that is likely contributing to her pain and decreased activity tolerance.   OBJECTIVE IMPAIRMENTS: decreased activity tolerance, decreased ROM, decreased strength, impaired flexibility, and pain.   ACTIVITY LIMITATIONS: standing, stairs, and locomotion level  PARTICIPATION LIMITATIONS: shopping and community activity  PERSONAL  FACTORS: Fitness, Past/current experiences, and Time since onset of injury/illness/exacerbation are also affecting patient's functional outcome.    GOALS: Goals reviewed with patient? Yes  SHORT TERM GOALS: Target date: 05/20/2024  Patient will be I with initial HEP in order to progress with therapy. Baseline: HEP provided at eval Goal status: INITIAL  2.  Patient will report bilateral achilles pain </= 1/10 in order to reduce functional limitations Baseline: 3/10 Goal status: INITIAL  LONG TERM GOALS: Target date: 06/17/2024  Patient will be I with final HEP to maintain progress from PT. Baseline: HEP provided at eval Goal status: INITIAL  2.  Patient will report PSFS >/= 8 in order to indicate improvement in their functional ability. Baseline: 4 Goal status: INITIAL  3.  Patient will demonstrate bilateral ankle strength 5/5 MMT in order to improve her walking tolerance Baseline: see limitations above Goal status: INITIAL  4.  Patient will demonstrate bilateral ankle DF >/= 10 deg in order to improve tolerance for going down stairs Baseline: see limitations above Goal status: INITIAL   PLAN: PT FREQUENCY: 1-2x/week  PT DURATION: 8 weeks  PLANNED INTERVENTIONS: 97164- PT Re-evaluation, 97750- Physical Performance Testing, 97110-Therapeutic exercises, 97530- Therapeutic activity, 97112- Neuromuscular re-education, 97535- Self Care, 02859- Manual therapy, [phone]- Gait training, [phone]- Ionotophoresis 4mg /ml Dexamethasone , Patient/Family education, Balance training, Stair training, Taping, Joint mobilization, Joint manipulation, Cryotherapy, and Moist heat  PLAN FOR NEXT SESSION: Review HEP and progress PRN, manual and stretching for calf, progress ankle strength and achilles load tolerance, balance training, incorporate LE and hip strengthening   Elaine Daring, PT, DPT, LAT, ATC 04/29/24  11:10 AM Phone: [phone] Fax: [phone]

## 2024-05-05 DIAGNOSIS — I10 Essential (primary) hypertension: Secondary | ICD-10-CM | POA: Diagnosis not present

## 2024-05-05 DIAGNOSIS — K219 Gastro-esophageal reflux disease without esophagitis: Secondary | ICD-10-CM | POA: Diagnosis not present

## 2024-05-05 DIAGNOSIS — E559 Vitamin D deficiency, unspecified: Secondary | ICD-10-CM | POA: Diagnosis not present

## 2024-05-05 DIAGNOSIS — E782 Mixed hyperlipidemia: Secondary | ICD-10-CM | POA: Diagnosis not present

## 2024-05-05 DIAGNOSIS — R790 Abnormal level of blood mineral: Secondary | ICD-10-CM | POA: Diagnosis not present

## 2024-05-05 DIAGNOSIS — Z85820 Personal history of malignant melanoma of skin: Secondary | ICD-10-CM | POA: Diagnosis not present

## 2024-05-05 DIAGNOSIS — Z1331 Encounter for screening for depression: Secondary | ICD-10-CM | POA: Diagnosis not present

## 2024-05-05 DIAGNOSIS — I349 Nonrheumatic mitral valve disorder, unspecified: Secondary | ICD-10-CM | POA: Diagnosis not present

## 2024-05-05 DIAGNOSIS — Z0000 Encounter for general adult medical examination without abnormal findings: Secondary | ICD-10-CM | POA: Diagnosis not present

## 2024-05-05 DIAGNOSIS — R7303 Prediabetes: Secondary | ICD-10-CM | POA: Diagnosis not present

## 2024-05-05 DIAGNOSIS — J4520 Mild intermittent asthma, uncomplicated: Secondary | ICD-10-CM | POA: Diagnosis not present

## 2024-05-06 ENCOUNTER — Encounter: Payer: Self-pay | Admitting: Physical Therapy

## 2024-05-06 ENCOUNTER — Other Ambulatory Visit: Payer: Self-pay

## 2024-05-06 ENCOUNTER — Ambulatory Visit: Admitting: Physical Therapy

## 2024-05-06 DIAGNOSIS — M25571 Pain in right ankle and joints of right foot: Secondary | ICD-10-CM

## 2024-05-06 DIAGNOSIS — M25572 Pain in left ankle and joints of left foot: Secondary | ICD-10-CM | POA: Diagnosis not present

## 2024-05-06 DIAGNOSIS — M6281 Muscle weakness (generalized): Secondary | ICD-10-CM

## 2024-05-06 NOTE — Therapy (Signed)
 OUTPATIENT PHYSICAL THERAPY EVALUATION   Patient Name: Janet [name] MRN: [id number] DOB:[birth year], 73 y.o., female Today's Date: 05/06/2024   END OF SESSION:  PT End of Session - 05/06/24 0942     Visit Number 3    Number of Visits 9    Date for Recertification  06/17/24    Authorization Type [employer] MCR    Authorization Time Period 04/22/2024 - 07/21/2024    Authorization - Visit Number 3    Authorization - Number of Visits 8    Progress Note Due on Visit 10    PT Start Time 0935    PT Stop Time 1015    PT Time Calculation (min) 40 min    Activity Tolerance Patient tolerated treatment well    Behavior During Therapy [facility] for tasks assessed/performed            Past Medical History:  Diagnosis Date   Abrasion of elbow, right 12/29/2015   Anxiety    Asthma    daily and prn inhalers   Breast discharge    right bloody x 2 months   High cholesterol    takes lipitor   HOCM (hypertrophic obstructive cardiomyopathy) (HCC)    Hypertension    states under control with med., has been on med. x years   Left bundle branch block (LBBB)    Leg skin lesion, left    mole removed 12/27/2015   Melanoma (HCC)    Nipple lesion 12/2015   right nipple mass   [place] syndrome 02/02/2016   right   PONV (postoperative nausea and vomiting)    Past Surgical History:  Procedure Laterality Date   APPENDECTOMY     age 66   BREAST BIOPSY Right 05/22/2022   US  RT BREAST BX W LOC DEV 1ST LESION IMG BX SPEC US  GUIDE 05/22/2022 [facility] MAMMOGRAPHY   BREAST DUCTAL SYSTEM EXCISION Right 02/19/2017   Procedure: RIGHT BREAST CENTRAL DUCT EXCISION;  Surgeon: Vanderbilt Ned, MD;  Location: [facility];  Service: General;  Laterality: Right;   BREAST EXCISIONAL BIOPSY Right 2017   CARDIAC SURGERY  03/09/2014   septal myectomy   HYSTEROSCOPY WITH D & C  07/21/2004   submucosal myomectomy   INFERIOR OBLIQUE MYECTOMY  2015   MASS EXCISION Right 01/04/2016   Procedure: EXCISION  OF 1CM RIGHT NIPPLE MASS;  Surgeon: Ned Vanderbilt, MD;  Location: [facility];  Service: General;  Laterality: Right;   MITRAL VALVE REPAIR  03/09/2014   NAILBED REPAIR Right 10/14/2012   Procedure: BIOPSY NAIL MATRIX RIGHT THUMB;  Surgeon: Arley JONELLE Curia, MD;  Location: [facility];  Service: Orthopedics;  Laterality: Right;   SALPINGOOPHORECTOMY  2017   TEE WITHOUT CARDIOVERSION N/A 11/10/2013   Procedure: TRANSESOPHAGEAL ECHOCARDIOGRAM (TEE);  Surgeon: Leim VEAR Moose, MD;  [facility];  Service: Cardiovascular;  Laterality: N/A;   Patient Active Problem List   Diagnosis Date Noted   [place] syndrome 02/02/2016   Hypertrophic obstructive cardiomyopathy (HCC) 12/26/2015   Dyspnea 11/29/2014   Breath shortness 11/29/2014   S/P ventricular septal myectomy 04/06/2014   S/P MVR (mitral valve repair) 04/06/2014   LBBB (left bundle branch block)    HOCM (hypertrophic obstructive cardiomyopathy) (HCC)    Dynamic left ventricular outflow obstruction 11/19/2013   PVC's (premature ventricular contractions)    Mitral insufficiency 11/06/2013   Hypertension    MVP (mitral valve prolapse)    Dysrhythmia     PCP: [facility]  REFERRING PROVIDER: Kit Rush, MD  REFERRING DIAG: Achilles tendinitis, right leg; Achilles tendinitis, left leg  THERAPY DIAG:  Pain in right ankle and joints of right foot  Pain in left ankle and joints of left foot  Muscle weakness (generalized)  Rationale for Evaluation and Treatment: Rehabilitation  ONSET DATE: 03/23/2024   SUBJECTIVE:  SUBJECTIVE STATEMENT: Patient reports yesterday she was having more pain. She did put some lidocaine  cream on the achilles and states she is feeling better today.   Eval: Patient reports achilles pain on both sides that is worse on the left. She has been on steroids and reduced her walking so is feeling better now, and she is wearing compression socks.  She has been having this particular pain for about a month, but had this same pain years ago that just seemed to just get better. She did start walking a couple miles which may have flared up her current pain.  PERTINENT HISTORY: See PMH above  PAIN:  Are you having pain? Yes:  NPRS scale: 0/10 at rest, 3/10 at worst since seeing the doctor and starting medication Pain location: Bilateral achilles (R>L) Pain description: Sharp Aggravating factors: Walking, going down stairs Relieving factors: Rest, medication, icing  PRECAUTIONS: None  PATIENT GOALS: Improve pain to get back to walking   OBJECTIVE:  Note: Objective measures were completed at Evaluation unless otherwise noted. PATIENT SURVEYS:  PSFS: 4 Walking for exercise: 3 Going down stairs: 5  MUSCLE LENGTH: Calf tightness noted bilaterally  POSTURE:   Grossly WFL  PALPATION: Tender to palpation R>L mid portion achilles, non-tender to insertion or calf  LOWER EXTREMITY ROM:  Active ROM Right eval Left eval  Hip flexion    Hip extension    Hip abduction    Hip adduction    Hip internal rotation    Hip external rotation    Knee flexion    Knee extension    Ankle dorsiflexion 8 5  Ankle plantarflexion    Ankle inversion    Ankle eversion     (Blank rows = not tested)  LOWER EXTREMITY MMT:  MMT Right eval Left eval Rt / Lt 05/06/2024  Hip flexion     Hip extension     Hip abduction     Hip adduction     Hip internal rotation     Hip external rotation     Knee flexion     Knee extension     Ankle dorsiflexion 5 5   Ankle plantarflexion 4 3 4  / 4-  Ankle inversion 4 4   Ankle eversion 4 4    (Blank rows = not tested)  FUNCTIONAL TESTS:  Not formally assessed  04/29/2024: SLS: right 16 seconds, left 22 seconds  GAIT: Assistive device utilized: None Level of assistance: Complete Independence Comments: Grossly [facility]  TREATMENT  [facility] Adult PT Treatment:                                                DATE: 05/06/2024 Recumbent bike L3 x 5 min to improve endurance and workload capacity Slant board calf stretch 3 x 30 sec SLS 3 x 30 sec each Front foot elevated on 10 box SL calf raise 3 x 10 each Side clamshell with red 3 x 10 each  PATIENT EDUCATION:  Education details: HEP update Person educated: Patient Education method: Explanation, Demonstration, Tactile cues, Verbal cues Education comprehension: verbalized understanding, returned demonstration, verbal cues required, tactile cues required, and needs further education  HOME EXERCISE PROGRAM: Access Code: [email or web address]    ASSESSMENT: CLINICAL IMPRESSION: Patient tolerated therapy well with no adverse effects. Therapy focused primarily on strengthening for the calf/achilles, improving ankle control, and incorporated some hip strengthening with good tolerance. She does report feeling the SL heel raise exercise in the achilles but this is tolerable and she demonstrates full range with her heel raises. She did exhibit improvement in her single leg control this visit. Updated her HEP discontinued double leg heel raises and only perform the SL heel raise every other day to improve her tolerance for exercise. Patient would benefit from continued skilled PT to progress mobility and strength in order to reduce pain and maximize functional ability.   Eval: Patient is a 73 y.o. female who was seen today for physical therapy evaluation and treatment for bilateral achilles pain that seems consistent with mid portion achilles tendinopathy. She exhibits limitations in her calf flexibility and ankle strength bilaterally that is likely contributing to her pain and decreased activity tolerance.   OBJECTIVE IMPAIRMENTS: decreased activity tolerance, decreased ROM, decreased strength, impaired flexibility, and pain.   ACTIVITY  LIMITATIONS: standing, stairs, and locomotion level  PARTICIPATION LIMITATIONS: shopping and community activity  PERSONAL FACTORS: Fitness, Past/current experiences, and Time since onset of injury/illness/exacerbation are also affecting patient's functional outcome.    GOALS: Goals reviewed with patient? Yes  SHORT TERM GOALS: Target date: 05/20/2024  Patient will be I with initial HEP in order to progress with therapy. Baseline: HEP provided at eval Goal status: INITIAL  2.  Patient will report bilateral achilles pain </= 1/10 in order to reduce functional limitations Baseline: 3/10 Goal status: INITIAL  LONG TERM GOALS: Target date: 06/17/2024  Patient will be I with final HEP to maintain progress from PT. Baseline: HEP provided at eval Goal status: INITIAL  2.  Patient will report PSFS >/= 8 in order to indicate improvement in their functional ability. Baseline: 4 Goal status: INITIAL  3.  Patient will demonstrate bilateral ankle strength 5/5 MMT in order to improve her walking tolerance Baseline: see limitations above Goal status: INITIAL  4.  Patient will demonstrate bilateral ankle DF >/= 10 deg in order to improve tolerance for going down stairs Baseline: see limitations above Goal status: INITIAL   PLAN: PT FREQUENCY: 1-2x/week  PT DURATION: 8 weeks  PLANNED INTERVENTIONS: 97164- PT Re-evaluation, 97750- Physical Performance Testing, 97110-Therapeutic exercises, 97530- Therapeutic activity, [id number]- Neuromuscular re-education, 97535- Self Care, [id number]- Manual therapy, 97116- Gait training, [phone]- Ionotophoresis 4mg /ml Dexamethasone , Patient/Family education, Balance training, Stair training, Taping, Joint mobilization, Joint manipulation, Cryotherapy, and Moist heat  PLAN FOR NEXT SESSION: Review HEP and progress PRN, manual and stretching for calf, progress ankle strength  and achilles load tolerance, balance training, incorporate LE and hip  strengthening   Elaine Daring, PT, DPT, LAT, ATC 05/06/24  10:19 AM Phone: [phone] Fax: [phone]

## 2024-05-26 ENCOUNTER — Ambulatory Visit
Admission: RE | Admit: 2024-05-26 | Discharge: 2024-05-26 | Disposition: A | Source: Ambulatory Visit | Attending: Obstetrics and Gynecology | Admitting: Obstetrics and Gynecology

## 2024-05-26 DIAGNOSIS — Z1231 Encounter for screening mammogram for malignant neoplasm of breast: Secondary | ICD-10-CM

## 2024-05-27 ENCOUNTER — Encounter: Payer: Self-pay | Admitting: Physical Therapy

## 2024-05-27 ENCOUNTER — Other Ambulatory Visit: Payer: Self-pay

## 2024-05-27 ENCOUNTER — Ambulatory Visit: Admitting: Physical Therapy

## 2024-05-27 DIAGNOSIS — M25572 Pain in left ankle and joints of left foot: Secondary | ICD-10-CM | POA: Diagnosis not present

## 2024-05-27 DIAGNOSIS — M25571 Pain in right ankle and joints of right foot: Secondary | ICD-10-CM | POA: Diagnosis not present

## 2024-05-27 DIAGNOSIS — M6281 Muscle weakness (generalized): Secondary | ICD-10-CM

## 2024-05-27 NOTE — Patient Instructions (Signed)
 Access Code: [email or web address] URL: [email or web address] Date: 05/27/2024 Prepared by: Elaine Daring  Exercises - Long Sitting Ankle Plantar Flexion with Resistance  - 1 x daily - 3 sets - 10 reps - Standing Gastroc Stretch at Counter  - 1 x daily - 3 reps - 20 seconds hold - Heel Raises with Counter Support  - 1 x daily - 3 sets - 10 reps - Single Leg Stance  - 1 x daily - 3 reps - 30 seconds hold - Side Stepping with Resistance at Thighs  - 1 x daily - 3 sets - 20 reps

## 2024-05-27 NOTE — Therapy (Signed)
 OUTPATIENT PHYSICAL THERAPY EVALUATION   Patient Name: Janet [name] MRN: [id number] DOB:May 14, 1951, 73 y.o., female Today's Date: 05/27/2024   END OF SESSION:  PT End of Session - 05/27/24 0939     Visit Number 4    Number of Visits 9    Date for Recertification  06/17/24    Authorization Type [employer] MCR    Authorization Time Period 04/22/2024 - 07/21/2024    Authorization - Visit Number 4    Authorization - Number of Visits 8    Progress Note Due on Visit 10    PT Start Time 0935    PT Stop Time 1015    PT Time Calculation (min) 40 min    Activity Tolerance Patient tolerated treatment well    Behavior During Therapy [facility] for tasks assessed/performed             Past Medical History:  Diagnosis Date   Abrasion of elbow, right 12/29/2015   Anxiety    Asthma    daily and prn inhalers   Breast discharge    right bloody x 2 months   High cholesterol    takes lipitor   HOCM (hypertrophic obstructive cardiomyopathy) (HCC)    Hypertension    states under control with med., has been on med. x years   Left bundle branch block (LBBB)    Leg skin lesion, left    mole removed 12/27/2015   Melanoma (HCC)    Nipple lesion 12/2015   right nipple mass   P[place]-Turner syndrome 02/02/2016   right   PONV (postoperative nausea and vomiting)    Past Surgical History:  Procedure Laterality Date   APPENDECTOMY     age 13   BREAST BIOPSY Right 05/22/2022   US  RT BREAST BX W LOC DEV 1ST LESION IMG BX SPEC US  GUIDE 05/22/2022 [facility] MAMMOGRAPHY   BREAST DUCTAL SYSTEM EXCISION Right 02/19/2017   Procedure: RIGHT BREAST CENTRAL DUCT EXCISION;  Surgeon: Vanderbilt Ned, MD;  Location: [facility];  Service: General;  Laterality: Right;   BREAST EXCISIONAL BIOPSY Right 2017   CARDIAC SURGERY  03/09/2014   septal myectomy   HYSTEROSCOPY WITH D & C  07/21/2004   submucosal myomectomy   INFERIOR OBLIQUE MYECTOMY  2015   MASS EXCISION Right 01/04/2016   Procedure:  EXCISION OF 1CM RIGHT NIPPLE MASS;  Surgeon: Ned Vanderbilt, MD;  Location: [facility];  Service: General;  Laterality: Right;   MITRAL VALVE REPAIR  03/09/2014   NAILBED REPAIR Right 10/14/2012   Procedure: BIOPSY NAIL MATRIX RIGHT THUMB;  Surgeon: Arley JONELLE Curia, MD;  Location: [facility];  Service: Orthopedics;  Laterality: Right;   SALPINGOOPHORECTOMY  2017   TEE WITHOUT CARDIOVERSION N/A 11/10/2013   Procedure: TRANSESOPHAGEAL ECHOCARDIOGRAM (TEE);  Surgeon: Leim VEAR Moose, MD;  Location: [facility];  Service: Cardiovascular;  Laterality: N/A;   Patient Active Problem List   Diagnosis Date Noted   P[place]-Turner syndrome 02/02/2016   Hypertrophic obstructive cardiomyopathy (HCC) 12/26/2015   Dyspnea 11/29/2014   Breath shortness 11/29/2014   S/P ventricular septal myectomy 04/06/2014   S/P MVR (mitral valve repair) 04/06/2014   LBBB (left bundle branch block)    HOCM (hypertrophic obstructive cardiomyopathy) (HCC)    Dynamic left ventricular outflow obstruction 11/19/2013   PVC's (premature ventricular contractions)    Mitral insufficiency 11/06/2013   Hypertension    MVP (mitral valve prolapse)    Dysrhythmia     PCP: [facility]  REFERRING PROVIDER: Kit Rush, MD  REFERRING DIAG: Achilles tendinitis, right leg; Achilles tendinitis, left leg  THERAPY DIAG:  Pain in right ankle and joints of right foot  Pain in left ankle and joints of left foot  Muscle weakness (generalized)  Rationale for Evaluation and Treatment: Rehabilitation  ONSET DATE: 03/23/2024   SUBJECTIVE:  SUBJECTIVE STATEMENT: Patient reports she did an enormous amount of walking and climbing hills while on her trip. She states her tendons held up but she did take Advil everyday. She reports yesterday she had a bad tendon day, but today it is feeling better. The right has no problem but the left on still bothers her.   Eval: Patient reports  achilles pain on both sides that is worse on the left. She has been on steroids and reduced her walking so is feeling better now, and she is wearing compression socks. She has been having this particular pain for about a month, but had this same pain years ago that just seemed to just get better. She did start walking a couple miles which may have flared up her current pain.  PERTINENT HISTORY: See PMH above  PAIN:  Are you having pain? Yes:  NPRS scale: 0/10 at rest, 3/10 at worst since seeing the doctor and starting medication Pain location: Bilateral achilles (R>L) Pain description: Sharp Aggravating factors: Walking, going down stairs Relieving factors: Rest, medication, icing  PRECAUTIONS: None  PATIENT GOALS: Improve pain to get back to walking   OBJECTIVE:  Note: Objective measures were completed at Evaluation unless otherwise noted. PATIENT SURVEYS:  PSFS: 4 Walking for exercise: 3 Going down stairs: 5  MUSCLE LENGTH: Calf tightness noted bilaterally  POSTURE:   Grossly WFL  PALPATION: Tender to palpation R>L mid portion achilles, non-tender to insertion or calf  LOWER EXTREMITY ROM:  Active ROM Right eval Left eval  Hip flexion    Hip extension    Hip abduction    Hip adduction    Hip internal rotation    Hip external rotation    Knee flexion    Knee extension    Ankle dorsiflexion 8 5  Ankle plantarflexion    Ankle inversion    Ankle eversion     (Blank rows = not tested)  LOWER EXTREMITY MMT:  MMT Right eval Left eval Rt / Lt 05/06/2024  Hip flexion     Hip extension     Hip abduction     Hip adduction     Hip internal rotation     Hip external rotation     Knee flexion     Knee extension     Ankle dorsiflexion 5 5   Ankle plantarflexion 4 3 4  / 4-  Ankle inversion 4 4   Ankle eversion 4 4    (Blank rows = not tested)  FUNCTIONAL TESTS:  Not formally assessed  04/29/2024: SLS: right 16 seconds, left 22 seconds  GAIT: Assistive  device utilized: None Level of assistance: Complete Independence Comments: Grossly [facility]  TREATMENT  [facility] Adult PT Treatment:                                                DATE: 05/27/2024 Recumbent bike L4 x 5 min to improve endurance and workload capacity Slant board calf stretch 3 x 30 sec Seated heel raise with 30# 3 x 15 each Front foot elevated on 10 box SL calf raise 3 x 10 each SLS 3 x 30 sec each Lateral band walk with green at knees 2 x 20 down/back  PATIENT EDUCATION:  Education details: HEP update Person educated: Patient Education method: Explanation, Demonstration, Tactile cues, Verbal cues, Handout Education comprehension: verbalized understanding, returned demonstration, verbal cues required, tactile cues required, and needs further education  HOME EXERCISE PROGRAM: Access Code: [email or web address]    ASSESSMENT: CLINICAL IMPRESSION: Patient tolerated therapy well with no adverse effects. Therapy continues to focus on progressing strengthening for the calves and improving load tolerance for the achilles, and progressed her hip strengthening this visit. Overall she does seem to be improving with her activity tolerance but does report days with more pain primarily on left achilles. Updated her HEP to incorporate hip strengthening with good tolerance. Patient would benefit from continued skilled PT to progress mobility and strength in order to reduce pain and maximize functional ability.   Eval: Patient is a 73 y.o. female who was seen today for physical therapy evaluation and treatment for bilateral achilles pain that seems consistent with mid portion achilles tendinopathy. She exhibits limitations in her calf flexibility and ankle strength bilaterally that is likely contributing to her pain and decreased activity tolerance.   OBJECTIVE IMPAIRMENTS: decreased  activity tolerance, decreased ROM, decreased strength, impaired flexibility, and pain.   ACTIVITY LIMITATIONS: standing, stairs, and locomotion level  PARTICIPATION LIMITATIONS: shopping and community activity  PERSONAL FACTORS: Fitness, Past/current experiences, and Time since onset of injury/illness/exacerbation are also affecting patient's functional outcome.    GOALS: Goals reviewed with patient? Yes  SHORT TERM GOALS: Target date: 05/20/2024  Patient will be I with initial HEP in order to progress with therapy. Baseline: HEP provided at eval 05/27/2024: independent Goal status: MET  2.  Patient will report bilateral achilles pain </= 1/10 in order to reduce functional limitations Baseline: 3/10 05/27/2024: pain up to 3/10 Goal status: ONGOING  LONG TERM GOALS: Target date: 06/17/2024  Patient will be I with final HEP to maintain progress from PT. Baseline: HEP provided at eval Goal status: INITIAL  2.  Patient will report PSFS >/= 8 in order to indicate improvement in their functional ability. Baseline: 4 Goal status: INITIAL  3.  Patient will demonstrate bilateral ankle strength 5/5 MMT in order to improve her walking tolerance Baseline: see limitations above Goal status: INITIAL  4.  Patient will demonstrate bilateral ankle DF >/= 10 deg in order to improve tolerance for going down stairs Baseline: see limitations above Goal status: INITIAL   PLAN: PT FREQUENCY: 1-2x/week  PT DURATION: 8 weeks  PLANNED INTERVENTIONS: 97164- PT Re-evaluation, 97750- Physical Performance Testing, 97110-Therapeutic exercises, 97530- Therapeutic activity, [id number]- Neuromuscular re-education, 97535- Self Care, 02859- Manual therapy, 97116- Gait training, [phone]- Ionotophoresis 4mg /ml Dexamethasone , Patient/Family education, Balance training, Stair training, Taping, Joint mobilization, Joint manipulation, Cryotherapy, and Moist heat  PLAN FOR NEXT SESSION: Review HEP and progress PRN,  manual and stretching for calf, progress ankle strength and achilles  load tolerance, balance training, incorporate LE and hip strengthening   Elaine Daring, PT, DPT, LAT, ATC 05/27/24  10:17 AM Phone: [phone] Fax: [phone]

## 2024-05-28 ENCOUNTER — Other Ambulatory Visit: Payer: Self-pay | Admitting: Obstetrics and Gynecology

## 2024-05-28 DIAGNOSIS — R928 Other abnormal and inconclusive findings on diagnostic imaging of breast: Secondary | ICD-10-CM

## 2024-06-01 ENCOUNTER — Encounter: Payer: Self-pay | Admitting: Physical Therapy

## 2024-06-01 ENCOUNTER — Other Ambulatory Visit: Payer: Self-pay

## 2024-06-01 ENCOUNTER — Ambulatory Visit: Admitting: Physical Therapy

## 2024-06-01 DIAGNOSIS — M6281 Muscle weakness (generalized): Secondary | ICD-10-CM

## 2024-06-01 DIAGNOSIS — M25571 Pain in right ankle and joints of right foot: Secondary | ICD-10-CM

## 2024-06-01 DIAGNOSIS — M25572 Pain in left ankle and joints of left foot: Secondary | ICD-10-CM

## 2024-06-01 NOTE — Therapy (Signed)
 OUTPATIENT PHYSICAL THERAPY TREATMENT   Patient Name: Janet [name] MRN: [id number] DOB:[birth year], 73 y.o., female Today's Date: 06/01/2024   END OF SESSION:  PT End of Session - 06/01/24 0935     Visit Number 5    Number of Visits 9    Date for Recertification  06/17/24    Authorization Type [employer] MCR    Authorization Time Period 04/22/2024 - 07/21/2024    Authorization - Visit Number 5    Authorization - Number of Visits 8    Progress Note Due on Visit 10    PT Start Time 0933    PT Stop Time 1011    PT Time Calculation (min) 38 min    Activity Tolerance Patient tolerated treatment well    Behavior During Therapy [facility] for tasks assessed/performed              Past Medical History:  Diagnosis Date   Abrasion of elbow, right 12/29/2015   Anxiety    Asthma    daily and prn inhalers   Breast discharge    right bloody x 2 months   High cholesterol    takes lipitor   HOCM (hypertrophic obstructive cardiomyopathy) (HCC)    Hypertension    states under control with med., has been on med. x years   Left bundle branch block (LBBB)    Leg skin lesion, left    mole removed 12/27/2015   Melanoma (HCC)    Nipple lesion 12/2015   right nipple mass   Parsonage-Turner syndrome 02/02/2016   right   PONV (postoperative nausea and vomiting)    Past Surgical History:  Procedure Laterality Date   APPENDECTOMY     age 30   BREAST BIOPSY Right 05/22/2022   US  RT BREAST BX W LOC DEV 1ST LESION IMG BX SPEC US  GUIDE 05/22/2022 [facility]MOGR[facility]   BREAST DUCTAL SYSTEM EXCISION Right 02/19/2017   Procedure: RIGHT BREAST CENTRAL DUCT EXCISION;  Surgeon: Vanderbilt Ned, MD;  Location: [facility];  Service: General;  Laterality: Right;   BREAST EXCISIONAL BIOPSY Right 2017   CARDIAC SURGERY  03/09/2014   septal myectomy   HYSTEROSCOPY WITH D & C  07/21/2004   submucosal myomectomy   INFERIOR OBLIQUE MYECTOMY  2015   MASS EXCISION Right 01/04/2016   Procedure:  EXCISION OF 1CM RIGHT NIPPLE MASS;  Surgeon: Ned Vanderbilt, MD;  Location: [facility];  Service: General;  Laterality: Right;   MITRAL VALVE REPAIR  03/09/2014   NAILBED REPAIR Right 10/14/2012   Procedure: BIOPSY NAIL MATRIX RIGHT THUMB;  Surgeon: Arley JONELLE Curia, MD;  Location:  SURGERY CENTER;  Service: Orthopedics;  Laterality: Right;   SALPINGOOPHORECTOMY  2017   TEE WITHOUT CARDIOVERSION N/A 11/10/2013   Procedure: TRANSESOPHAGEAL ECHOCARDIOGRAM (TEE);  Surgeon: Leim VEAR Moose, MD;  Location: [facility];  Service: Cardiovascular;  Laterality: N/A;   Patient Active Problem List   Diagnosis Date Noted   Parsonage-Turner syndrome 02/02/2016   Hypertrophic obstructive cardiomyopathy (HCC) 12/26/2015   Dyspnea 11/29/2014   Breath shortness 11/29/2014   S/P ventricular septal myectomy 04/06/2014   S/P MVR (mitral valve repair) 04/06/2014   LBBB (left bundle branch block)    HOCM (hypertrophic obstructive cardiomyopathy) (HCC)    Dynamic left ventricular outflow obstruction 11/19/2013   PVC's (premature ventricular contractions)    Mitral insufficiency 11/06/2013   Hypertension    MVP (mitral valve prolapse)    Dysrhythmia     PCP: [facility]  REFERRING PROVIDER: Kit Rush, MD  REFERRING DIAG: Achilles tendinitis, right leg; Achilles tendinitis, left leg  THERAPY DIAG:  Pain in left ankle and joints of left foot  Pain in right ankle and joints of right foot  Muscle weakness (generalized)  Rationale for Evaluation and Treatment: Rehabilitation  ONSET DATE: 03/23/2024   SUBJECTIVE:  SUBJECTIVE STATEMENT: Patient reports the left calf still gives her some trouble but does seem to improve some.   Eval: Patient reports achilles pain on both sides that is worse on the left. She has been on steroids and reduced her walking so is feeling better now, and she is wearing compression socks. She has been having this particular pain  for about a month, but had this same pain years ago that just seemed to just get better. She did start walking a couple miles which may have flared up her current pain.  PERTINENT HISTORY: See PMH above  PAIN:  Are you having pain? Yes:  NPRS scale: 0/10 at rest, 3/10 at worst since seeing the doctor and starting medication Pain location: Left achilles Pain description: Sharp Aggravating factors: Walking, going down stairs Relieving factors: Rest, medication, icing  PRECAUTIONS: None  PATIENT GOALS: Improve pain to get back to walking   OBJECTIVE:  Note: Objective measures were completed at Evaluation unless otherwise noted. PATIENT SURVEYS:  PSFS: 4 Walking for exercise: 3 Going down stairs: 5  MUSCLE LENGTH: Calf tightness noted bilaterally  POSTURE:   Grossly WFL  PALPATION: Tender to palpation R>L mid portion achilles, non-tender to insertion or calf  LOWER EXTREMITY ROM:  Active ROM Right eval Left eval  Hip flexion    Hip extension    Hip abduction    Hip adduction    Hip internal rotation    Hip external rotation    Knee flexion    Knee extension    Ankle dorsiflexion 8 5  Ankle plantarflexion    Ankle inversion    Ankle eversion     (Blank rows = not tested)  LOWER EXTREMITY MMT:  MMT Right eval Left eval Rt / Lt 05/06/2024  Hip flexion     Hip extension     Hip abduction     Hip adduction     Hip internal rotation     Hip external rotation     Knee flexion     Knee extension     Ankle dorsiflexion 5 5   Ankle plantarflexion 4 3 4  / 4-  Ankle inversion 4 4   Ankle eversion 4 4    (Blank rows = not tested)  FUNCTIONAL TESTS:  Not formally assessed  04/29/2024: SLS: right 16 seconds, left 22 seconds  GAIT: Assistive device utilized: None Level of assistance: Complete Independence Comments: Grossly [facility]  TREATMENT  [facility] Adult PT Treatment:                                                DATE: 06/01/2024 Recumbent bike L4 x 5 min to improve endurance and workload capacity Slant board calf stretch 3 x 30 sec DL heel raises on edge of box 3 x 10 Seated heel raise with 30# 3 x 15 each Seated ankle inversion with yellow in figure-4 position 3 x 10 each SLS 3 x 30 sec each  Discussed walking program, beginning at 1/2 miles and slowly progressing as able  PATIENT EDUCATION:  Education details: HEP Person educated: Patient Education method: Programmer, Multimedia, Demonstration, Tactile cues, Verbal cues Education comprehension: verbalized understanding, returned demonstration, verbal cues required, tactile cues required, and needs further education  HOME EXERCISE PROGRAM: Access Code: [email or web address]    ASSESSMENT: CLINICAL IMPRESSION: Patient tolerated therapy well with no adverse effects. Therapy focused primarily on progressing her ankle strength and control with good tolerance. She was able to progress to heel raises off edge of a box but did report muscular fatigue of the calves. Discussed walking program and beginning with shorter distance and gradually progressing. Patient would benefit from continued skilled PT to progress mobility and strength in order to reduce pain and maximize functional ability.   Eval: Patient is a 73 y.o. female who was seen today for physical therapy evaluation and treatment for bilateral achilles pain that seems consistent with mid portion achilles tendinopathy. She exhibits limitations in her calf flexibility and ankle strength bilaterally that is likely contributing to her pain and decreased activity tolerance.   OBJECTIVE IMPAIRMENTS: decreased activity tolerance, decreased ROM, decreased strength, impaired flexibility, and pain.   ACTIVITY LIMITATIONS: standing, stairs, and locomotion level  PARTICIPATION LIMITATIONS: shopping and community activity  PERSONAL FACTORS:  Fitness, Past/current experiences, and Time since onset of injury/illness/exacerbation are also affecting patient's functional outcome.    GOALS: Goals reviewed with patient? Yes  SHORT TERM GOALS: Target date: 05/20/2024  Patient will be I with initial HEP in order to progress with therapy. Baseline: HEP provided at eval 05/27/2024: independent Goal status: MET  2.  Patient will report bilateral achilles pain </= 1/10 in order to reduce functional limitations Baseline: 3/10 05/27/2024: pain up to 3/10 Goal status: ONGOING  LONG TERM GOALS: Target date: 06/17/2024  Patient will be I with final HEP to maintain progress from PT. Baseline: HEP provided at eval Goal status: INITIAL  2.  Patient will report PSFS >/= 8 in order to indicate improvement in their functional ability. Baseline: 4 Goal status: INITIAL  3.  Patient will demonstrate bilateral ankle strength 5/5 MMT in order to improve her walking tolerance Baseline: see limitations above Goal status: INITIAL  4.  Patient will demonstrate bilateral ankle DF >/= 10 deg in order to improve tolerance for going down stairs Baseline: see limitations above Goal status: INITIAL   PLAN: PT FREQUENCY: 1-2x/week  PT DURATION: 8 weeks  PLANNED INTERVENTIONS: 97164- PT Re-evaluation, 97750- Physical Performance Testing, 97110-Therapeutic exercises, 97530- Therapeutic activity, [id number]- Neuromuscular re-education, 97535- Self Care, [id number]- Manual therapy, 97116- Gait training, [phone]- Ionotophoresis 4mg /ml Dexamethasone , Patient/Family education, Balance training, Stair training, Taping, Joint mobilization, Joint manipulation, Cryotherapy, and Moist heat  PLAN FOR NEXT SESSION: Review HEP and progress PRN, manual and stretching for calf, progress ankle strength and achilles load tolerance, balance  training, incorporate LE and hip strengthening   Elaine Daring, PT, DPT, LAT, ATC 06/01/24  10:18 AM Phone: [phone] Fax:  [phone]

## 2024-06-09 ENCOUNTER — Other Ambulatory Visit: Payer: Self-pay

## 2024-06-09 ENCOUNTER — Encounter: Payer: Self-pay | Admitting: Physical Therapy

## 2024-06-09 ENCOUNTER — Ambulatory Visit: Admitting: Physical Therapy

## 2024-06-09 DIAGNOSIS — M6281 Muscle weakness (generalized): Secondary | ICD-10-CM | POA: Diagnosis not present

## 2024-06-09 DIAGNOSIS — M25571 Pain in right ankle and joints of right foot: Secondary | ICD-10-CM

## 2024-06-09 DIAGNOSIS — M25572 Pain in left ankle and joints of left foot: Secondary | ICD-10-CM | POA: Diagnosis not present

## 2024-06-09 NOTE — Therapy (Signed)
 OUTPATIENT PHYSICAL THERAPY TREATMENT   Patient Name: Janet [name] MRN: [id number] DOB:September 30, 1950, 73 y.o., female Today's Date: 06/09/2024   END OF SESSION:  PT End of Session - 06/09/24 1026     Visit Number 6    Number of Visits 9    Date for Recertification  06/17/24    Authorization Type [employer] MCR    Authorization Time Period 04/22/2024 - 07/21/2024    Authorization - Visit Number 6    Authorization - Number of Visits 8    Progress Note Due on Visit 10    PT Start Time 1019    PT Stop Time 1101    PT Time Calculation (min) 42 min    Activity Tolerance Patient tolerated treatment well    Behavior During Therapy WFL for tasks assessed/performed               Past Medical History:  Diagnosis Date   Abrasion of elbow, right 12/29/2015   Anxiety    Asthma    daily and prn inhalers   Breast discharge    right bloody x 2 months   High cholesterol    takes lipitor   HOCM (hypertrophic obstructive cardiomyopathy) (HCC)    Hypertension    states under control with med., has been on med. x years   Left bundle branch block (LBBB)    Leg skin lesion, left    mole removed 12/27/2015   Melanoma (HCC)    Nipple lesion 12/2015   right nipple mass   Parsonage-Turner syndrome 02/02/2016   right   PONV (postoperative nausea and vomiting)    Past Surgical History:  Procedure Laterality Date   APPENDECTOMY     age 66   BREAST BIOPSY Right 05/22/2022   US  RT BREAST BX W LOC DEV 1ST LESION IMG BX SPEC US  GUIDE 05/22/2022 [facility]AMMOGRAPHY   BREAST DUCTAL SYSTEM EXCISION Right 02/19/2017   Procedure: RIGHT BREAST CENTRAL DUCT EXCISION;  Surgeon: Vanderbilt Ned, MD;  Location: [facility];  Service: General;  Laterality: Right;   BREAST EXCISIONAL BIOPSY Right 2017   CARDIAC SURGERY  03/09/2014   septal myectomy   HYSTEROSCOPY WITH D & C  07/21/2004   submucosal myomectomy   INFERIOR OBLIQUE MYECTOMY  2015   MASS EXCISION Right 01/04/2016   Procedure:  EXCISION OF 1CM RIGHT NIPPLE MASS;  Surgeon: Ned Vanderbilt, MD;  Location: [facility];  Service: General;  Laterality: Right;   MITRAL VALVE REPAIR  03/09/2014   NAILBED REPAIR Right 10/14/2012   Procedure: BIOPSY NAIL MATRIX RIGHT THUMB;  Surgeon: Arley JONELLE Curia, MD;  Location: [facility];  Service: Orthopedics;  Laterality: Right;   SALPINGOOPHORECTOMY  2017   TEE WITHOUT CARDIOVERSION N/A 11/10/2013   Procedure: TRANSESOPHAGEAL ECHOCARDIOGRAM (TEE);  Surgeon: Leim VEAR Moose, MD;  Location: [facility];  Service: Cardiovascular;  Laterality: N/A;   Patient Active Problem List   Diagnosis Date Noted   Parsonage-Turner syndrome 02/02/2016   Hypertrophic obstructive cardiomyopathy (HCC) 12/26/2015   Dyspnea 11/29/2014   Breath shortness 11/29/2014   S/P ventricular septal myectomy 04/06/2014   S/P MVR (mitral valve repair) 04/06/2014   LBBB (left bundle branch block)    HOCM (hypertrophic obstructive cardiomyopathy) (HCC)    Dynamic left ventricular outflow obstruction 11/19/2013   PVC's (premature ventricular contractions)    Mitral insufficiency 11/06/2013   Hypertension    MVP (mitral valve prolapse)    Dysrhythmia     PCP: [facility]  REFERRING PROVIDER: Kit Rush, MD  REFERRING DIAG: Achilles tendinitis, right leg; Achilles tendinitis, left leg  THERAPY DIAG:  Pain in left ankle and joints of left foot  Pain in right ankle and joints of right foot  Muscle weakness (generalized)  Rationale for Evaluation and Treatment: Rehabilitation  ONSET DATE: 03/23/2024   SUBJECTIVE:  SUBJECTIVE STATEMENT: Patient reports the left achilles still is painful at times. She has been walking more and feels overall better since starting therapy.  Eval: Patient reports achilles pain on both sides that is worse on the left. She has been on steroids and reduced her walking so is feeling better now, and she is wearing compression  socks. She has been having this particular pain for about a month, but had this same pain years ago that just seemed to just get better. She did start walking a couple miles which may have flared up her current pain.  PERTINENT HISTORY: See PMH above  PAIN:  Are you having pain? Yes:  NPRS scale: 0/10 at rest, 3/10 at worst since seeing the doctor and starting medication Pain location: Left achilles Pain description: Sharp Aggravating factors: Walking, going down stairs Relieving factors: Rest, medication, icing  PRECAUTIONS: None  PATIENT GOALS: Improve pain to get back to walking   OBJECTIVE:  Note: Objective measures were completed at Evaluation unless otherwise noted. PATIENT SURVEYS:  PSFS: 4 Walking for exercise: 3 Going down stairs: 5  MUSCLE LENGTH: Calf tightness noted bilaterally  POSTURE:   Grossly WFL  PALPATION: Tender to palpation R>L mid portion achilles, non-tender to insertion or calf  LOWER EXTREMITY ROM:  Active ROM Right eval Left eval  Hip flexion    Hip extension    Hip abduction    Hip adduction    Hip internal rotation    Hip external rotation    Knee flexion    Knee extension    Ankle dorsiflexion 8 5  Ankle plantarflexion    Ankle inversion    Ankle eversion     (Blank rows = not tested)  LOWER EXTREMITY MMT:  MMT Right eval Left eval Rt / Lt 05/06/2024  Hip flexion     Hip extension     Hip abduction     Hip adduction     Hip internal rotation     Hip external rotation     Knee flexion     Knee extension     Ankle dorsiflexion 5 5   Ankle plantarflexion 4 3 4  / 4-  Ankle inversion 4 4   Ankle eversion 4 4    (Blank rows = not tested)  FUNCTIONAL TESTS:  Not formally assessed  04/29/2024: SLS: right 16 seconds, left 22 seconds  GAIT: Assistive device utilized: None Level of assistance: Complete Independence Comments: Grossly [facility]  TREATMENT  [facility] Adult PT Treatment:                                                DATE: 06/09/2024 Recumbent bike L4 x 5 min to improve endurance and workload capacity Slant board calf stretch 3 x 30 sec Front foot elevated heel raise 3 x 10 each Side clamshell with green 2 x 15 each Bridge with green at knees 2 x 10 Seated heel raise with 35# 3 x 15 each Seated ankle inversion with red in figure-4 position 2 x 15 each Seated ankle eversion with red loop 2 x 15 each SLS 3 x 30 sec each  Discussed walking program, beginning at 1/2 miles and slowly progressing as able  PATIENT EDUCATION:  Education details: HEP Person educated: Patient Education method: Programmer, Multimedia, Demonstration, Actor cues, Verbal cues Education comprehension: verbalized understanding, returned demonstration, verbal cues required, tactile cues required, and needs further education  HOME EXERCISE PROGRAM: Access Code: [email or web address]    ASSESSMENT: CLINICAL IMPRESSION: Patient tolerated therapy well with no adverse effects. Therapy continues to focus primarily on progressing strength for the ankle and incorporated more hip strengthening this visit. She does seem to be improving but reports continued left achilles pain which improved following therapy this visit. She reports walking more recently. No changes to her HEP this visit. Patient would benefit from continued skilled PT to progress mobility and strength in order to reduce pain and maximize functional ability.   Eval: Patient is a 73 y.o. female who was seen today for physical therapy evaluation and treatment for bilateral achilles pain that seems consistent with mid portion achilles tendinopathy. She exhibits limitations in her calf flexibility and ankle strength bilaterally that is likely contributing to her pain and decreased activity tolerance.   OBJECTIVE IMPAIRMENTS: decreased activity tolerance, decreased ROM,  decreased strength, impaired flexibility, and pain.   ACTIVITY LIMITATIONS: standing, stairs, and locomotion level  PARTICIPATION LIMITATIONS: shopping and community activity  PERSONAL FACTORS: Fitness, Past/current experiences, and Time since onset of injury/illness/exacerbation are also affecting patient's functional outcome.    GOALS: Goals reviewed with patient? Yes  SHORT TERM GOALS: Target date: 05/20/2024  Patient will be I with initial HEP in order to progress with therapy. Baseline: HEP provided at eval 05/27/2024: independent Goal status: MET  2.  Patient will report bilateral achilles pain </= 1/10 in order to reduce functional limitations Baseline: 3/10 05/27/2024: pain up to 3/10 Goal status: ONGOING  LONG TERM GOALS: Target date: 06/17/2024  Patient will be I with final HEP to maintain progress from PT. Baseline: HEP provided at eval Goal status: INITIAL  2.  Patient will report PSFS >/= 8 in order to indicate improvement in their functional ability. Baseline: 4 Goal status: INITIAL  3.  Patient will demonstrate bilateral ankle strength 5/5 MMT in order to improve her walking tolerance Baseline: see limitations above Goal status: INITIAL  4.  Patient will demonstrate bilateral ankle DF >/= 10 deg in order to improve tolerance for going down stairs Baseline: see limitations above Goal status: INITIAL   PLAN: PT FREQUENCY: 1-2x/week  PT DURATION: 8 weeks  PLANNED INTERVENTIONS: 97164- PT Re-evaluation, 97750- Physical Performance Testing, 97110-Therapeutic exercises, 97530- Therapeutic activity, 97112- Neuromuscular re-education, 97535- Self Care, 02859- Manual therapy, [phone]- Gait training, [phone]- Ionotophoresis 4mg /ml Dexamethasone , Patient/Family education, Balance training, Stair training, Taping, Joint mobilization, Joint  manipulation, Cryotherapy, and Moist heat  PLAN FOR NEXT SESSION: Review HEP and progress PRN, manual and stretching for calf,  progress ankle strength and achilles load tolerance, balance training, incorporate LE and hip strengthening   Elaine Daring, PT, DPT, LAT, ATC 06/09/24  11:13 AM Phone: [phone] Fax: [phone]

## 2024-06-10 DIAGNOSIS — M7662 Achilles tendinitis, left leg: Secondary | ICD-10-CM | POA: Diagnosis not present

## 2024-06-10 DIAGNOSIS — M7661 Achilles tendinitis, right leg: Secondary | ICD-10-CM | POA: Diagnosis not present

## 2024-06-12 ENCOUNTER — Ambulatory Visit
Admission: RE | Admit: 2024-06-12 | Discharge: 2024-06-12 | Disposition: A | Source: Ambulatory Visit | Attending: Obstetrics and Gynecology | Admitting: Obstetrics and Gynecology

## 2024-06-12 DIAGNOSIS — N6315 Unspecified lump in the right breast, overlapping quadrants: Secondary | ICD-10-CM | POA: Diagnosis not present

## 2024-06-12 DIAGNOSIS — R928 Other abnormal and inconclusive findings on diagnostic imaging of breast: Secondary | ICD-10-CM | POA: Diagnosis not present

## 2024-06-12 DIAGNOSIS — N6001 Solitary cyst of right breast: Secondary | ICD-10-CM | POA: Diagnosis not present

## 2024-06-16 ENCOUNTER — Other Ambulatory Visit: Payer: Self-pay

## 2024-06-16 ENCOUNTER — Ambulatory Visit: Admitting: Physical Therapy

## 2024-06-16 ENCOUNTER — Encounter: Admitting: Physical Therapy

## 2024-06-16 ENCOUNTER — Encounter: Payer: Self-pay | Admitting: Physical Therapy

## 2024-06-16 DIAGNOSIS — M25572 Pain in left ankle and joints of left foot: Secondary | ICD-10-CM

## 2024-06-16 DIAGNOSIS — M25571 Pain in right ankle and joints of right foot: Secondary | ICD-10-CM

## 2024-06-16 DIAGNOSIS — M6281 Muscle weakness (generalized): Secondary | ICD-10-CM

## 2024-06-16 NOTE — Patient Instructions (Signed)
 Access Code: [email or web address] URL: [email or web address] Date: 06/16/2024 Prepared by: Elaine Daring  Exercises - Long Sitting Ankle Plantar Flexion with Resistance  - 1 x daily - 3 sets - 10 reps - Standing Gastroc Stretch at Counter  - 1 x daily - 3 reps - 20 seconds hold - Heel Raises with Counter Support  - 1 x daily - 3 sets - 10 reps - Single Leg Stance  - 1 x daily - 3 reps - 30 seconds hold - Side Stepping with Resistance at Thighs  - 1 x daily - 3 sets - 20 reps - Long Sitting Ankle Eversion with Resistance  - 1 x daily - 3 sets - 10 reps

## 2024-06-16 NOTE — Therapy (Signed)
 OUTPATIENT PHYSICAL THERAPY TREATMENT   Patient Name: Janet [name] MRN: [id number] DOB:06-27-1951, 73 y.o., female Today's Date: 06/16/2024   END OF SESSION:  PT End of Session - 06/16/24 1308     Visit Number 7    Number of Visits 15    Date for Recertification  08/11/24    Authorization Type [employer] MCR    Authorization Time Period 04/22/2024 - 07/21/2024    Authorization - Visit Number 7    Authorization - Number of Visits 8    Progress Note Due on Visit 10    PT Start Time 1300    PT Stop Time 1345    PT Time Calculation (min) 45 min    Activity Tolerance Patient tolerated treatment well    Behavior During Therapy [facility] for tasks assessed/performed                Past Medical History:  Diagnosis Date   Abrasion of elbow, right 12/29/2015   Anxiety    Asthma    daily and prn inhalers   Breast discharge    right bloody x 2 months   High cholesterol    takes lipitor   HOCM (hypertrophic obstructive cardiomyopathy) (HCC)    Hypertension    states under control with med., has been on med. x years   Left bundle branch block (LBBB)    Leg skin lesion, left    mole removed 12/27/2015   Melanoma (HCC)    Nipple lesion 12/2015   right nipple mass   Parsonage-Turner syndrome 02/02/2016   right   PONV (postoperative nausea and vomiting)    Past Surgical History:  Procedure Laterality Date   APPENDECTOMY     age 3   BREAST BIOPSY Right 05/22/2022   US  RT BREAST BX W LOC DEV 1ST LESION IMG BX SPEC US  GUIDE 05/22/2022 [facility] MAMMOGRAPHY   BREAST DUCTAL SYSTEM EXCISION Right 02/19/2017   Procedure: RIGHT BREAST CENTRAL DUCT EXCISION;  Surgeon: Vanderbilt Ned, MD;  Location: [facility];  Service: General;  Laterality: Right;   BREAST EXCISIONAL BIOPSY Right 2017   CARDIAC SURGERY  03/09/2014   septal myectomy   HYSTEROSCOPY WITH D & C  07/21/2004   submucosal myomectomy   INFERIOR OBLIQUE MYECTOMY  2015   MASS EXCISION Right 01/04/2016   Procedure:  EXCISION OF 1CM RIGHT NIPPLE MASS;  Surgeon: Ned Vanderbilt, MD;  Location: [facility];  Service: General;  Laterality: Right;   MITRAL VALVE REPAIR  03/09/2014   NAILBED REPAIR Right 10/14/2012   Procedure: BIOPSY NAIL MATRIX RIGHT THUMB;  Surgeon: Arley JONELLE Curia, MD;  Location: [facility];  Service: Orthopedics;  Laterality: Right;   SALPINGOOPHORECTOMY  2017   TEE WITHOUT CARDIOVERSION N/A 11/10/2013   Procedure: TRANSESOPHAGEAL ECHOCARDIOGRAM (TEE);  Surgeon: Leim VEAR Moose, MD;  Location: [facility] END[facility];  Service: Cardiovascular;  Laterality: N/A;   Patient Active Problem List   Diagnosis Date Noted   Parsonage-Turner syndrome 02/02/2016   Hypertrophic obstructive cardiomyopathy (HCC) 12/26/2015   Dyspnea 11/29/2014   Breath shortness 11/29/2014   S/P ventricular septal myectomy 04/06/2014   S/P MVR (mitral valve repair) 04/06/2014   LBBB (left bundle branch block)    HOCM (hypertrophic obstructive cardiomyopathy) (HCC)    Dynamic left ventricular outflow obstruction 11/19/2013   PVC's (premature ventricular contractions)    Mitral insufficiency 11/06/2013   Hypertension    MVP (mitral valve prolapse)    Dysrhythmia     PCP: Pa,  [facility]  REFERRING PROVIDER: Kit Rush, MD  REFERRING DIAG: Achilles tendinitis, right leg; Achilles tendinitis, left leg  THERAPY DIAG:  Pain in left ankle and joints of left foot  Pain in right ankle and joints of right foot  Muscle weakness (generalized)  Rationale for Evaluation and Treatment: Rehabilitation  ONSET DATE: 03/23/2024   SUBJECTIVE:  SUBJECTIVE STATEMENT: Patient reports she saw the doctor he had her wear a nitroglycerin patch and also gave her a steroid pack for last resort. She leave for [place] for 3 weeks.   Eval: Patient reports achilles pain on both sides that is worse on the left. She has been on steroids and reduced her walking so is feeling better now, and she is  wearing compression socks. She has been having this particular pain for about a month, but had this same pain years ago that just seemed to just get better. She did start walking a couple miles which may have flared up her current pain.  PERTINENT HISTORY: See PMH above  PAIN:  Are you having pain? Yes:  NPRS scale: 0/10 at rest, 3/10 at worst Pain location: Left achilles Pain description: Sharp Aggravating factors: Walking, going down stairs Relieving factors: Rest, medication, icing  PRECAUTIONS: None  PATIENT GOALS: Improve pain to get back to walking   OBJECTIVE:  Note: Objective measures were completed at Evaluation unless otherwise noted. PATIENT SURVEYS:  PSFS: 4 Walking for exercise: 3 Going down stairs: 5  06/16/2024:  PSFS: 6.5 Walking for exercise: 8 Going down stairs: 5  MUSCLE LENGTH: Calf tightness noted bilaterally  POSTURE:   Grossly WFL  PALPATION: Tender to palpation R>L mid portion achilles, non-tender to insertion or calf  LOWER EXTREMITY ROM:  Active ROM Right eval Left eval Rt / Lt 06/16/2024  Hip flexion     Hip extension     Hip abduction     Hip adduction     Hip internal rotation     Hip external rotation     Knee flexion     Knee extension     Ankle dorsiflexion 8 5 10  / 10  Ankle plantarflexion     Ankle inversion     Ankle eversion      (Blank rows = not tested)  LOWER EXTREMITY MMT:  MMT Right eval Left eval Rt / Lt 05/06/2024 Rt / Lt 06/16/2024  Hip flexion      Hip extension      Hip abduction      Hip adduction      Hip internal rotation      Hip external rotation      Knee flexion      Knee extension      Ankle dorsiflexion 5 5    Ankle plantarflexion 4 3 4  / 4- 4 / 4-  Ankle inversion 4 4    Ankle eversion 4 4     (Blank rows = not tested)  FUNCTIONAL TESTS:  Not formally assessed  04/29/2024: SLS: right 16 seconds, left 22 seconds  06/16/2024: SLS: 30 sec each   GAIT: Assistive device utilized:  None Level of assistance: Complete Independence Comments: Grossly [facility]  TREATMENT  [facility] Adult PT Treatment:                                                DATE: 06/16/2024 Recumbent bike L4 x 5 min to improve endurance and workload capacity Slant board calf stretch 3 x 30 sec Standing eccentric heel raise 2 x 10 each SL heel raise x 10 each Longsitting ankle inversion/eversion with green 3 x 10 each Longsitting ankle eversion with red for HEP demo Seated heel raise with 50# 3 x 10 each SLS x 30 sec,on Airex 2 x 30 sec each  PATIENT EDUCATION:  Education details: HEP update, POC extension Person educated: Patient Education method: Explanation, Demonstration, Tactile cues, Verbal cues, Handout Education comprehension: verbalized understanding, returned demonstration, verbal cues required, tactile cues required, and needs further education  HOME EXERCISE PROGRAM: Access Code: [email or web address]    ASSESSMENT: CLINICAL IMPRESSION: Patient tolerated therapy well with no adverse effects. Therapy focused on progressing her calf/achilles strengthening and workload capacity, and progressing her ankle stability and balance with good tolerance. She was able to complete single leg heel raises but greater difficultly and reduce height on the left with report of achilles pain. She did report greater difficulty with ankle eversion this visit on the left so updated her HEP to progress strengthening. She progressed to using foam surface for SLS and did exhibit greater difficulty. Overall she reports improvement in her functional status but does remain limited due to left achilles pain. Patient would benefit from continued skilled PT to progress mobility and strength in order to reduce pain and maximize functional ability, so will extend PT POC for 8 more weeks.   Eval: Patient is a 73 y.o.  female who was seen today for physical therapy evaluation and treatment for bilateral achilles pain that seems consistent with mid portion achilles tendinopathy. She exhibits limitations in her calf flexibility and ankle strength bilaterally that is likely contributing to her pain and decreased activity tolerance.   OBJECTIVE IMPAIRMENTS: decreased activity tolerance, decreased ROM, decreased strength, impaired flexibility, and pain.   ACTIVITY LIMITATIONS: standing, stairs, and locomotion level  PARTICIPATION LIMITATIONS: shopping and community activity  PERSONAL FACTORS: Fitness, Past/current experiences, and Time since onset of injury/illness/exacerbation are also affecting patient's functional outcome.    GOALS: Goals reviewed with patient? Yes  SHORT TERM GOALS: Target date: 07/14/2024  Patient will be I with initial HEP in order to progress with therapy. Baseline: HEP provided at eval 05/27/2024: independent Goal status: MET  2.  Patient will report bilateral achilles pain </= 1/10 in order to reduce functional limitations Baseline: 3/10 05/27/2024: pain up to 3/10 06/16/2024: pain up to 3/10 Goal status: ONGOING  LONG TERM GOALS: Target date: 08/11/2024  Patient will be I with final HEP to maintain progress from PT. Baseline: HEP provided at eval 06/16/2024: progressing Goal status: ONGOING  2.  Patient will report PSFS >/= 8 in order to indicate improvement in their functional ability. Baseline: 4 06/16/2024: 6.5 Goal status: ONGOING  3.  Patient will demonstrate bilateral ankle strength 5/5 MMT in order to improve her walking tolerance Baseline: see limitations above 06/16/2024: see limitations above Goal status: ONGOING  4.  Patient will demonstrate bilateral ankle DF >/= 10 deg in order to improve tolerance for going down stairs Baseline: see limitations above 06/16/2024: 10 deg Goal status: MET   PLAN:  PT FREQUENCY: 1-2x/week  PT DURATION: 8 weeks  PLANNED  INTERVENTIONS: 97164- PT Re-evaluation, 97750- Physical Performance Testing, 97110-Therapeutic exercises, 97530- Therapeutic activity, 97112- Neuromuscular re-education, 97535- Self Care, 02859- Manual therapy, [phone]- Gait training, [phone]- Ionotophoresis 4mg /ml Dexamethasone , Patient/Family education, Balance training, Stair training, Taping, Joint mobilization, Joint manipulation, Cryotherapy, and Moist heat  PLAN FOR NEXT SESSION: Review HEP and progress PRN, manual and stretching for calf, progress ankle strength and achilles load tolerance, balance training, incorporate LE and hip strengthening   Elaine Daring, PT, DPT, LAT, ATC 06/16/24  1:55 PM Phone: [phone] Fax: [phone]

## 2024-06-17 ENCOUNTER — Inpatient Hospital Stay: Attending: Oncology

## 2024-06-17 ENCOUNTER — Inpatient Hospital Stay: Admitting: Oncology

## 2024-06-17 VITALS — BP 136/65 | HR 87 | Temp 97.8°F | Resp 18 | Ht 60.0 in | Wt 174.5 lb

## 2024-06-17 DIAGNOSIS — Z8701 Personal history of pneumonia (recurrent): Secondary | ICD-10-CM | POA: Diagnosis not present

## 2024-06-17 DIAGNOSIS — J45909 Unspecified asthma, uncomplicated: Secondary | ICD-10-CM | POA: Diagnosis not present

## 2024-06-17 DIAGNOSIS — I10 Essential (primary) hypertension: Secondary | ICD-10-CM | POA: Insufficient documentation

## 2024-06-17 DIAGNOSIS — C9110 Chronic lymphocytic leukemia of B-cell type not having achieved remission: Secondary | ICD-10-CM | POA: Diagnosis not present

## 2024-06-17 DIAGNOSIS — Z86006 Personal history of melanoma in-situ: Secondary | ICD-10-CM | POA: Insufficient documentation

## 2024-06-17 DIAGNOSIS — N63 Unspecified lump in unspecified breast: Secondary | ICD-10-CM | POA: Diagnosis not present

## 2024-06-17 DIAGNOSIS — F419 Anxiety disorder, unspecified: Secondary | ICD-10-CM | POA: Insufficient documentation

## 2024-06-17 DIAGNOSIS — Z806 Family history of leukemia: Secondary | ICD-10-CM | POA: Insufficient documentation

## 2024-06-17 LAB — CBC WITH DIFFERENTIAL (CANCER CENTER ONLY)
Abs Immature Granulocytes: 0.17 K/uL — ABNORMAL HIGH (ref 0.00–0.07)
Basophils Absolute: 0.2 K/uL — ABNORMAL HIGH (ref 0.0–0.1)
Basophils Relative: 0 %
Eosinophils Absolute: 0.3 K/uL (ref 0.0–0.5)
Eosinophils Relative: 0 %
HCT: 40.6 % (ref 36.0–46.0)
Hemoglobin: 13.5 g/dL (ref 12.0–15.0)
Immature Granulocytes: 0 %
Lymphocytes Relative: 88 %
Lymphs Abs: 66.2 K/uL — ABNORMAL HIGH (ref 0.7–4.0)
MCH: 31.3 pg (ref 26.0–34.0)
MCHC: 33.3 g/dL (ref 30.0–36.0)
MCV: 94.2 fL (ref 80.0–100.0)
Monocytes Absolute: 2.2 K/uL — ABNORMAL HIGH (ref 0.1–1.0)
Monocytes Relative: 3 %
Neutro Abs: 6.5 K/uL (ref 1.7–7.7)
Neutrophils Relative %: 9 %
Platelet Count: 328 K/uL (ref 150–400)
RBC: 4.31 MIL/uL (ref 3.87–5.11)
RDW: 13 % (ref 11.5–15.5)
WBC Count: 75.5 K/uL (ref 4.0–10.5)
nRBC: 0 % (ref 0.0–0.2)

## 2024-06-17 NOTE — Progress Notes (Signed)
°  [facility] OFFICE PROGRESS NOTE   Diagnosis: CLL  INTERVAL HISTORY:   Janet [name] returns as scheduled.  She feels well.  She reports intentional weight loss.  No fever, night sweats, or recent infection.  She has noted a lymph node at the right posterior skull.  Her asthma is not active at present.  Objective:  Vital signs in last 24 hours:  Blood pressure 136/65, pulse 87, temperature 97.8 F (36.6 C), temperature source Temporal, resp. rate 18, height 5' (1.524 m), weight 174 lb 8 oz (79.2 kg), SpO2 96%.    Lymphatics: Bilateral cervical and axillary nodes.  The largest node in the axilla a measuring 2-4 cm.  There is a 1 cm right occipital node. Resp: Bilateral end expiratory wheeze at the posterior chest, no respiratory distress Cardio: Regular rate and rhythm GI: No hepatomegaly Vascular: No leg edema  Lab Results:  Lab Results  Component Value Date   WBC 75.5 (HH) 06/17/2024   HGB 13.5 06/17/2024   HCT 40.6 06/17/2024   MCV 94.2 06/17/2024   PLT 328 06/17/2024   NEUTROABS 6.5 06/17/2024    CMP  Lab Results  Component Value Date   NA 136 02/07/2023   K 4.0 02/07/2023   CL 99 02/07/2023   CO2 26 02/07/2023   GLUCOSE 162 (H) 02/07/2023   BUN 11 02/07/2023   CREATININE 0.66 02/07/2023   CALCIUM 9.2 02/07/2023   PROT 7.4 02/07/2023   ALBUMIN 4.2 02/07/2023   AST 17 02/07/2023   ALT 26 02/07/2023   ALKPHOS 83 02/07/2023   BILITOT 1.4 (H) 02/07/2023   GFRNONAA >60 02/07/2023   GFRAA 90 06/09/2018    No results found for: CEA1, CEA, CAN199, CA125  Lab Results  Component Value Date   INR 1.1 02/07/2023   LABPROT 14.6 02/07/2023    Imaging:  No results found.  Medications: I have reviewed the patient's current medications.   Assessment/Plan: CLL Peripheral blood flow cytometry 05/07/2022-CD5, CD19, CD20, CD200, and kappa positive clonal B-cell population consistent with CLL FISH-no 17p13 deletion, deletion of ATM  (11q22.3) and 13q14.3 Normal immunoglobulin levels and negative serum immunofixation 05/04/2022 Asthma Hypertension History of melanoma in situ History of hypertrophic cardiomyopathy Family history of CLL (paternal cousin) Anxiety 8.   Right breast mass Mammogram 05/09/2022-possible right breast mass, bilateral axillary lymphadenopathy Diagnostic right mammogram and right ultrasound 05/17/2022-persistent oval mass in the lower outer right breast, bulky right axillary adenopathy, targeted ultrasound-7 mm oval right breast mass at 9:00 compatible with an abnormal appearing intramammary lymph node, multiple enlarged right axillary nodes 05/22/2022-right breast and axillary needle core biopsies-CLL, CD20, CD5, and CD200 positive 05/22/2022-flow cytometry on the breast and axillary lymph node biopsies combined revealed a monoclonal kappa restricted B-cell population with expression of CD20, CD5, and CD200.  CD38 positive 9.  Pneumonia 02/07/2023-treated with azithromycin /cefpodoxime  after 1 dose of azithromycin /ceftriaxone  in the emergency room      Disposition: Janet [name] has CLL.  She is asymptomatic.  The lymphocyte count has been rising over the past year.  There is no indication for treating the CLL at present.  The plan is to continue observation.  She will return for an office and lab visit in 3 months.  She will call for new symptoms.  Arley Hof, MD  06/17/2024  11:27 AM

## 2024-06-17 NOTE — Progress Notes (Unsigned)
 CRITICAL VALUE STICKER 75.5 WBC CRITICAL VALUE:  RECEIVER (on-site recipient of call Janet [name]  DATE & TIME NOTIFIED: 06/28/24   MESSENGER (representative from [facility] 1047  TIME OF NOTIFICATION:  RESPONSE:

## 2024-07-21 ENCOUNTER — Encounter: Payer: Self-pay | Admitting: Physical Therapy

## 2024-07-21 ENCOUNTER — Ambulatory Visit: Admitting: Physical Therapy

## 2024-07-21 ENCOUNTER — Other Ambulatory Visit: Payer: Self-pay

## 2024-07-21 DIAGNOSIS — M25572 Pain in left ankle and joints of left foot: Secondary | ICD-10-CM

## 2024-07-21 DIAGNOSIS — M6281 Muscle weakness (generalized): Secondary | ICD-10-CM

## 2024-07-21 DIAGNOSIS — M25571 Pain in right ankle and joints of right foot: Secondary | ICD-10-CM

## 2024-07-21 NOTE — Therapy (Signed)
 " OUTPATIENT PHYSICAL THERAPY TREATMENT   Patient Name: Janet [name] MRN: [id number] DOB:10/14/1950, 74 y.o., female Today's Date: 07/21/2024   END OF SESSION:  PT End of Session - 07/21/24 0938     Visit Number 8    Number of Visits 15    Date for Recertification  08/11/24    Authorization Type [employer] MCR    Authorization Time Period 04/22/2024 - 07/21/2024    Authorization - Visit Number 8    Authorization - Number of Visits 8    Progress Note Due on Visit 10    PT Start Time 0934    PT Stop Time 1015    PT Time Calculation (min) 41 min    Activity Tolerance Patient tolerated treatment well    Behavior During Therapy [facility] for tasks assessed/performed                 Past Medical History:  Diagnosis Date   Abrasion of elbow, right 12/29/2015   Anxiety    Asthma    daily and prn inhalers   Breast discharge    right bloody x 2 months   High cholesterol    takes lipitor   HOCM (hypertrophic obstructive cardiomyopathy) (HCC)    Hypertension    states under control with med., has been on med. x years   Left bundle branch block (LBBB)    Leg skin lesion, left    mole removed 12/27/2015   Melanoma (HCC)    Nipple lesion 12/2015   right nipple mass   Parsonage-Turner syndrome 02/02/2016   right   PONV (postoperative nausea and vomiting)    Past Surgical History:  Procedure Laterality Date   APPENDECTOMY     age 3   BREAST BIOPSY Right 05/22/2022   US  RT BREAST BX W LOC DEV 1ST LESION IMG BX SPEC US  GUIDE 05/22/2022 [facility] MAMMOGRAPHY   BREAST DUCTAL SYSTEM EXCISION Right 02/19/2017   Procedure: RIGHT BREAST CENTRAL DUCT EXCISION;  Surgeon: Vanderbilt Ned, MD;  Location: [facility];  Service: General;  Laterality: Right;   BREAST EXCISIONAL BIOPSY Right 2017   CARDIAC SURGERY  03/09/2014   septal myectomy   HYSTEROSCOPY WITH D & C  07/21/2004   submucosal myomectomy   INFERIOR OBLIQUE MYECTOMY  2015   MASS EXCISION Right 01/04/2016   Procedure:  EXCISION OF 1CM RIGHT NIPPLE MASS;  Surgeon: Ned Vanderbilt, MD;  Location: [facility];  Service: General;  Laterality: Right;   MITRAL VALVE REPAIR  03/09/2014   NAILBED REPAIR Right 10/14/2012   Procedure: BIOPSY NAIL MATRIX RIGHT THUMB;  Surgeon: Arley JONELLE Curia, MD;  Location: [facility];  Service: Orthopedics;  Laterality: Right;   SALPINGOOPHORECTOMY  2017   TEE WITHOUT CARDIOVERSION N/A 11/10/2013   Procedure: TRANSESOPHAGEAL ECHOCARDIOGRAM (TEE);  Surgeon: Leim VEAR Moose, MD;  Location: [facility] END[facility]COPY;  Service: Cardiovascular;  Laterality: N/A;   Patient Active Problem List   Diagnosis Date Noted   Parsonage-Turner syndrome 02/02/2016   Hypertrophic obstructive cardiomyopathy (HCC) 12/26/2015   Dyspnea 11/29/2014   Breath shortness 11/29/2014   S/P ventricular septal myectomy 04/06/2014   S/P MVR (mitral valve repair) 04/06/2014   LBBB (left bundle branch block)    HOCM (hypertrophic obstructive cardiomyopathy) (HCC)    Dynamic left ventricular outflow obstruction 11/19/2013   PVC's (premature ventricular contractions)    Mitral insufficiency 11/06/2013   Hypertension    MVP (mitral valve prolapse)    Dysrhythmia  PCP: [facility]  REFERRING PROVIDER: Kit Rush, MD  REFERRING DIAG: Achilles tendinitis, right leg; Achilles tendinitis, left leg  THERAPY DIAG:  Pain in left ankle and joints of left foot  Pain in right ankle and joints of right foot  Muscle weakness (generalized)  Rationale for Evaluation and Treatment: Rehabilitation  ONSET DATE: 03/23/2024   SUBJECTIVE:  SUBJECTIVE STATEMENT: Patient reports the tendon is feeling better than last visit but it is still there.Pain mainly occurs if she walks for a distance.  Eval: Patient reports achilles pain on both sides that is worse on the left. She has been on steroids and reduced her walking so is feeling better now, and she is wearing compression socks. She  has been having this particular pain for about a month, but had this same pain years ago that just seemed to just get better. She did start walking a couple miles which may have flared up her current pain.  PERTINENT HISTORY: See PMH above  PAIN:  Are you having pain? Yes:  NPRS scale: 0/10 at rest, 3-4/10 at worst Pain location: Left achilles Pain description: Sharp Aggravating factors: Walking, going down stairs Relieving factors: Rest, medication, icing  PRECAUTIONS: None  PATIENT GOALS: Improve pain to get back to walking   OBJECTIVE:  Note: Objective measures were completed at Evaluation unless otherwise noted. PATIENT SURVEYS:  PSFS: 4 Walking for exercise: 3 Going down stairs: 5  06/16/2024:  PSFS: 6.5 Walking for exercise: 8 Going down stairs: 5  07/21/2024:  PSFS: 7 Walking for exercise: 8 Going down stairs: 6  MUSCLE LENGTH: Calf tightness noted bilaterally  POSTURE:   Grossly WFL  PALPATION: Tender to palpation R>L mid portion achilles, non-tender to insertion or calf  LOWER EXTREMITY ROM:  Active ROM Right eval Left eval Rt / Lt 06/16/2024  Hip flexion     Hip extension     Hip abduction     Hip adduction     Hip internal rotation     Hip external rotation     Knee flexion     Knee extension     Ankle dorsiflexion 8 5 10  / 10  Ankle plantarflexion     Ankle inversion     Ankle eversion      (Blank rows = not tested)  LOWER EXTREMITY MMT:  MMT Right eval Left eval Rt / Lt 05/06/2024 Rt / Lt 06/16/2024 Rt / Lt 07/21/2024  Hip flexion       Hip extension       Hip abduction       Hip adduction       Hip internal rotation       Hip external rotation       Knee flexion       Knee extension       Ankle dorsiflexion 5 5     Ankle plantarflexion 4 3 4  / 4- 4 / 4- 4 / 4  Ankle inversion 4 4     Ankle eversion 4 4      (Blank rows = not tested)  FUNCTIONAL TESTS:  Not formally assessed  04/29/2024: SLS: right 16 seconds, left 22  seconds  06/16/2024: SLS: 30 sec each   GAIT: Assistive device utilized: None Level of assistance: Complete Independence Comments: Grossly [facility]  TREATMENT  [facility] Adult PT Treatment:                                                DATE: 07/21/2024 Recumbent bike L4 x 5 min to improve endurance and workload capacity Heel raises on edge of box with focus on slow eccentric 3 x 10 Standing hip abduction with green at knees 3 x 15 each Seated SL heel raise on edge of box with 50# 3 x 10 each SLS on Airex 3 x 30 sec each  PATIENT EDUCATION:  Education details: HEP update Person educated: Patient Education method: Explanation, Demonstration, Tactile cues, Verbal cues, Handout Education comprehension: verbalized understanding, returned demonstration, verbal cues required, tactile cues required, and needs further education  HOME EXERCISE PROGRAM: Access Code: [email or web address]    ASSESSMENT: CLINICAL IMPRESSION: Patient tolerated therapy well with no adverse effects. Therapy focused on progressing her calf and achilles strength and load tolerance. She did not report any increase in achilles pain but did report muscle burn of the calves. Had patient perform heel raises from edge of box to increase range of motion with good tolerance. She does report a slight improvement in her functional status and demonstrates improvement in her strength this visit. No changes made to her HEP this visit. Patient would benefit from continued skilled PT to progress mobility and strength in order to reduce pain and maximize functional ability.   Eval: Patient is a 74 y.o. female who was seen today for physical therapy evaluation and treatment for bilateral achilles pain that seems consistent with mid portion achilles tendinopathy. She exhibits limitations in her calf flexibility and ankle strength  bilaterally that is likely contributing to her pain and decreased activity tolerance.   OBJECTIVE IMPAIRMENTS: decreased activity tolerance, decreased ROM, decreased strength, impaired flexibility, and pain.   ACTIVITY LIMITATIONS: standing, stairs, and locomotion level  PARTICIPATION LIMITATIONS: shopping and community activity  PERSONAL FACTORS: Fitness, Past/current experiences, and Time since onset of injury/illness/exacerbation are also affecting patient's functional outcome.    GOALS: Goals reviewed with patient? Yes  SHORT TERM GOALS: Target date: 07/14/2024  Patient will be I with initial HEP in order to progress with therapy. Baseline: HEP provided at eval 05/27/2024: independent Goal status: MET  2.  Patient will report bilateral achilles pain </= 1/10 in order to reduce functional limitations Baseline: 3/10 05/27/2024: pain up to 3/10 06/16/2024: pain up to 3/10 07/21/2024: pain 3-4/10 Goal status: ONGOING  LONG TERM GOALS: Target date: 08/11/2024  Patient will be I with final HEP to maintain progress from PT. Baseline: HEP provided at eval 06/16/2024: progressing 07/21/2024: progressing Goal status: ONGOING  2.  Patient will report PSFS >/= 8 in order to indicate improvement in their functional ability. Baseline: 4 06/16/2024: 6.5 07/21/2024: 7 Goal status: ONGOING  3.  Patient will demonstrate bilateral ankle strength 5/5 MMT in order to improve her walking tolerance Baseline: see limitations above 06/16/2024: see limitations above 07/21/2024: see limitations above Goal status: ONGOING  4.  Patient will demonstrate bilateral ankle DF >/= 10 deg in order to improve tolerance for going down stairs Baseline: see limitations above 06/16/2024: 10 deg Goal status: MET   PLAN: PT FREQUENCY: 1x/week  PT DURATION: 8 weeks  PLANNED INTERVENTIONS: 97164- PT Re-evaluation, 97750- Physical Performance Testing, 97110-Therapeutic exercises, 97530- Therapeutic activity,  [id number]- Neuromuscular re-education, 97535- Self Care, 02859- Manual  therapy, [phone]- Gait training, 02966- Ionotophoresis 4mg /ml Dexamethasone , Patient/Family education, Balance training, Stair training, Taping, Joint mobilization, Joint manipulation, Cryotherapy, and Moist heat  PLAN FOR NEXT SESSION: Review HEP and progress PRN, manual and stretching for calf, progress ankle strength and achilles load tolerance, balance training, incorporate LE and hip strengthening   Elaine Daring, PT, DPT, LAT, ATC 07/21/2024  10:32 AM Phone: [phone] Fax: [phone]   "

## 2024-07-23 ENCOUNTER — Encounter: Payer: Self-pay | Admitting: Oncology

## 2024-07-28 ENCOUNTER — Ambulatory Visit: Admitting: Physical Therapy

## 2024-07-28 ENCOUNTER — Encounter: Payer: Self-pay | Admitting: Physical Therapy

## 2024-07-28 ENCOUNTER — Other Ambulatory Visit: Payer: Self-pay

## 2024-07-28 DIAGNOSIS — M25572 Pain in left ankle and joints of left foot: Secondary | ICD-10-CM | POA: Diagnosis not present

## 2024-07-28 DIAGNOSIS — M6281 Muscle weakness (generalized): Secondary | ICD-10-CM | POA: Diagnosis not present

## 2024-07-28 DIAGNOSIS — M25571 Pain in right ankle and joints of right foot: Secondary | ICD-10-CM

## 2024-07-28 NOTE — Therapy (Signed)
 " OUTPATIENT PHYSICAL THERAPY TREATMENT   Patient Name: Janet [name] MRN: [id number] DOB:1950-11-07, 74 y.o., female Today's Date: 07/28/2024   END OF SESSION:  PT End of Session - 07/28/24 1102     Visit Number 9    Number of Visits 15    Date for Recertification  08/11/24    Authorization Type [employer] MCR    Authorization Time Period 07/28/2024 - 10/26/2024    Authorization - Visit Number 1    Authorization - Number of Visits 8    Progress Note Due on Visit 10    PT Start Time 1100    PT Stop Time 1145    PT Time Calculation (min) 45 min    Activity Tolerance Patient tolerated treatment well    Behavior During Therapy [facility] for tasks assessed/performed                  Past Medical History:  Diagnosis Date   Abrasion of elbow, right 12/29/2015   Anxiety    Asthma    daily and prn inhalers   Breast discharge    right bloody x 2 months   High cholesterol    takes lipitor   HOCM (hypertrophic obstructive cardiomyopathy) (HCC)    Hypertension    states under control with med., has been on med. x years   Left bundle branch block (LBBB)    Leg skin lesion, left    mole removed 12/27/2015   Melanoma (HCC)    Nipple lesion 12/2015   right nipple mass   Parsonage-Turner syndrome 02/02/2016   right   PONV (postoperative nausea and vomiting)    Past Surgical History:  Procedure Laterality Date   APPENDECTOMY     age 85   BREAST BIOPSY Right 05/22/2022   US  RT BREAST BX W LOC DEV 1ST LESION IMG BX SPEC US  GUIDE 05/22/2022 [facility] MAMMOGRAPHY   BREAST DUCTAL SYSTEM EXCISION Right 02/19/2017   Procedure: RIGHT BREAST CENTRAL DUCT EXCISION;  Surgeon: Vanderbilt Ned, MD;  Location: [facility];  Service: General;  Laterality: Right;   BREAST EXCISIONAL BIOPSY Right 2017   CARDIAC SURGERY  03/09/2014   septal myectomy   HYSTEROSCOPY WITH D & C  07/21/2004   submucosal myomectomy   INFERIOR OBLIQUE MYECTOMY  2015   MASS EXCISION Right 01/04/2016    Procedure: EXCISION OF 1CM RIGHT NIPPLE MASS;  Surgeon: Ned Vanderbilt, MD;  Location: [facility];  Service: General;  Laterality: Right;   MITRAL VALVE REPAIR  03/09/2014   NAILBED REPAIR Right 10/14/2012   Procedure: BIOPSY NAIL MATRIX RIGHT THUMB;  Surgeon: Arley JONELLE Curia, MD;  Location: [facility];  Service: Orthopedics;  Laterality: Right;   SALPINGOOPHORECTOMY  2017   TEE WITHOUT CARDIOVERSION N/A 11/10/2013   Procedure: TRANSESOPHAGEAL ECHOCARDIOGRAM (TEE);  Surgeon: Leim VEAR Moose, MD;  Location: [facility];  Service: Cardiovascular;  Laterality: N/A;   Patient Active Problem List   Diagnosis Date Noted   Parsonage-Turner syndrome 02/02/2016   Hypertrophic obstructive cardiomyopathy (HCC) 12/26/2015   Dyspnea 11/29/2014   Breath shortness 11/29/2014   S/P ventricular septal myectomy 04/06/2014   S/P MVR (mitral valve repair) 04/06/2014   LBBB (left bundle branch block)    HOCM (hypertrophic obstructive cardiomyopathy) (HCC)    Dynamic left ventricular outflow obstruction 11/19/2013   PVC's (premature ventricular contractions)    Mitral insufficiency 11/06/2013   Hypertension    MVP (mitral valve prolapse)    Dysrhythmia  PCP: [facility]  REFERRING PROVIDER: Kit Rush, MD  REFERRING DIAG: Achilles tendinitis, right leg; Achilles tendinitis, left leg  THERAPY DIAG:  Pain in left ankle and joints of left foot  Pain in right ankle and joints of right foot  Muscle weakness (generalized)  Rationale for Evaluation and Treatment: Rehabilitation  ONSET DATE: 03/23/2024   SUBJECTIVE:  SUBJECTIVE STATEMENT: Patient reports she is doing well, still having mainly left achilles pain that seems to come and go. She did see her doctor who said to continue therapy and nitroglycerin patches.   Eval: Patient reports achilles pain on both sides that is worse on the left. She has been on steroids and reduced her walking so is  feeling better now, and she is wearing compression socks. She has been having this particular pain for about a month, but had this same pain years ago that just seemed to just get better. She did start walking a couple miles which may have flared up her current pain.  PERTINENT HISTORY: See PMH above  PAIN:  Are you having pain? Yes:  NPRS scale: 0/10 at rest, 3-4/10 at worst Pain location: Left achilles Pain description: Sharp Aggravating factors: Walking, going down stairs Relieving factors: Rest, medication, icing  PRECAUTIONS: None  PATIENT GOALS: Improve pain to get back to walking   OBJECTIVE:  Note: Objective measures were completed at Evaluation unless otherwise noted. PATIENT SURVEYS:  PSFS: 4 Walking for exercise: 3 Going down stairs: 5  06/16/2024:  PSFS: 6.5 Walking for exercise: 8 Going down stairs: 5  07/21/2024:  PSFS: 7 Walking for exercise: 8 Going down stairs: 6  MUSCLE LENGTH: Calf tightness noted bilaterally  POSTURE:   Grossly WFL  PALPATION: Tender to palpation R>L mid portion achilles, non-tender to insertion or calf  LOWER EXTREMITY ROM:  Active ROM Right eval Left eval Rt / Lt 06/16/2024  Hip flexion     Hip extension     Hip abduction     Hip adduction     Hip internal rotation     Hip external rotation     Knee flexion     Knee extension     Ankle dorsiflexion 8 5 10  / 10  Ankle plantarflexion     Ankle inversion     Ankle eversion      (Blank rows = not tested)  LOWER EXTREMITY MMT:  MMT Right eval Left eval Rt / Lt 05/06/2024 Rt / Lt 06/16/2024 Rt / Lt 07/21/2024  Hip flexion       Hip extension       Hip abduction       Hip adduction       Hip internal rotation       Hip external rotation       Knee flexion       Knee extension       Ankle dorsiflexion 5 5     Ankle plantarflexion 4 3 4  / 4- 4 / 4- 4 / 4  Ankle inversion 4 4     Ankle eversion 4 4      (Blank rows = not tested)  FUNCTIONAL TESTS:  Not  formally assessed  04/29/2024: SLS: right 16 seconds, left 22 seconds  06/16/2024: SLS: 30 sec each   GAIT: Assistive device utilized: None Level of assistance: Complete Independence Comments: Grossly [facility]  TREATMENT  [facility] Adult PT Treatment:                                                DATE: 07/28/2024 Recumbent bike L4 x 5 min to improve endurance and workload capacity Eccentric SL heel raise 3 x 10 Seated SL heel raise with 50# 3 x 10 each SLS 3 x 30 sec each Forward heel tap from 4 box 3 x 10 each Lateral band walk with blue at knee 3 x 20 down/back  PATIENT EDUCATION:  Education details: HEP update Person educated: Patient Education method: Explanation, Demonstration, Tactile cues, Verbal cues, Handout Education comprehension: verbalized understanding, returned demonstration, verbal cues required, tactile cues required, and needs further education  HOME EXERCISE PROGRAM: Access Code: [email or web address]    ASSESSMENT: CLINICAL IMPRESSION: Patient tolerated therapy well with no adverse effects. Therapy continued to focus on progressing loading for the achilles and progressing hip strength and SL control with good tolerance. She does report continued left achilles pain with her loading but able to tolerate and complete all reps. She did report left hip feeling weaker with step down exercise. Updated HEP to progress achilles tolerance. Patient would benefit from continued skilled PT to progress mobility and strength in order to reduce pain and maximize functional ability.   Eval: Patient is a 74 y.o. female who was seen today for physical therapy evaluation and treatment for bilateral achilles pain that seems consistent with mid portion achilles tendinopathy. She exhibits limitations in her calf flexibility and ankle strength bilaterally that is likely  contributing to her pain and decreased activity tolerance.   OBJECTIVE IMPAIRMENTS: decreased activity tolerance, decreased ROM, decreased strength, impaired flexibility, and pain.   ACTIVITY LIMITATIONS: standing, stairs, and locomotion level  PARTICIPATION LIMITATIONS: shopping and community activity  PERSONAL FACTORS: Fitness, Past/current experiences, and Time since onset of injury/illness/exacerbation are also affecting patient's functional outcome.    GOALS: Goals reviewed with patient? Yes  SHORT TERM GOALS: Target date: 07/14/2024  Patient will be I with initial HEP in order to progress with therapy. Baseline: HEP provided at eval 05/27/2024: independent Goal status: MET  2.  Patient will report bilateral achilles pain </= 1/10 in order to reduce functional limitations Baseline: 3/10 05/27/2024: pain up to 3/10 06/16/2024: pain up to 3/10 07/21/2024: pain 3-4/10 Goal status: ONGOING  LONG TERM GOALS: Target date: 08/11/2024  Patient will be I with final HEP to maintain progress from PT. Baseline: HEP provided at eval 06/16/2024: progressing 07/21/2024: progressing Goal status: ONGOING  2.  Patient will report PSFS >/= 8 in order to indicate improvement in their functional ability. Baseline: 4 06/16/2024: 6.5 07/21/2024: 7 Goal status: ONGOING  3.  Patient will demonstrate bilateral ankle strength 5/5 MMT in order to improve her walking tolerance Baseline: see limitations above 06/16/2024: see limitations above 07/21/2024: see limitations above Goal status: ONGOING  4.  Patient will demonstrate bilateral ankle DF >/= 10 deg in order to improve tolerance for going down stairs Baseline: see limitations above 06/16/2024: 10 deg Goal status: MET   PLAN: PT FREQUENCY: 1x/week  PT DURATION: 8 weeks  PLANNED INTERVENTIONS: 97164- PT Re-evaluation, 97750- Physical Performance Testing, 97110-Therapeutic exercises, 97530- Therapeutic activity, 97112- Neuromuscular  re-education, 97535- Self Care, 02859- Manual therapy, [phone]- Gait training, [phone]- Ionotophoresis 4mg /ml Dexamethasone , Patient/Family education, Balance training, Stair training, Taping, Joint mobilization, Joint manipulation, Cryotherapy,  and Moist heat  PLAN FOR NEXT SESSION: Review HEP and progress PRN, manual and stretching for calf, progress ankle strength and achilles load tolerance, balance training, incorporate LE and hip strengthening   Elaine Daring, PT, DPT, LAT, ATC 07/28/24  11:54 AM Phone: [phone] Fax: [phone]   "

## 2024-07-28 NOTE — Patient Instructions (Signed)
 Access Code: [email or web address] URL: [email or web address] Date: 07/28/2024 Prepared by: Elaine Daring  Exercises - Long Sitting Ankle Plantar Flexion with Resistance  - 1 x daily - 3 sets - 10 reps - Long Sitting Ankle Eversion with Resistance  - 1 x daily - 3 sets - 10 reps - Heel Raises with Counter Support  - 1 x daily - 3 sets - 10 reps - Standing Eccentric Heel Raise  - 3 x weekly - 3 sets - 10 reps - Single Leg Stance  - 1 x daily - 3 reps - 30 seconds hold - Side Stepping with Resistance at Thighs  - 1 x daily - 3 sets - 20 reps

## 2024-07-30 ENCOUNTER — Telehealth: Payer: Self-pay | Admitting: *Deleted

## 2024-07-30 NOTE — Telephone Encounter (Signed)
 Per Olam Ned, NP  Please let her know the MMR vaccine is contraindicated due to having CLL.

## 2024-08-04 ENCOUNTER — Encounter: Admitting: Physical Therapy

## 2024-08-11 ENCOUNTER — Encounter: Admitting: Physical Therapy

## 2024-08-14 ENCOUNTER — Other Ambulatory Visit: Payer: Self-pay

## 2024-08-14 ENCOUNTER — Encounter: Payer: Self-pay | Admitting: Physical Therapy

## 2024-08-14 ENCOUNTER — Ambulatory Visit: Admitting: Physical Therapy

## 2024-08-14 DIAGNOSIS — M25571 Pain in right ankle and joints of right foot: Secondary | ICD-10-CM

## 2024-08-14 DIAGNOSIS — M25572 Pain in left ankle and joints of left foot: Secondary | ICD-10-CM

## 2024-08-14 DIAGNOSIS — M6281 Muscle weakness (generalized): Secondary | ICD-10-CM

## 2024-08-14 NOTE — Therapy (Signed)
 " OUTPATIENT PHYSICAL THERAPY TREATMENT  Progress Note Reporting Period 04/22/2024 to 08/14/2024  See note below for Objective Data and Assessment of Progress/Goals.     Patient Name: Janet [name] MRN: [id number] DOB:1951/05/21, 74 y.o., female Today's Date: 08/14/2024   END OF SESSION:  PT End of Session - 08/14/24 1025     Visit Number 10    Number of Visits 16    Date for Recertification  09/25/24    Authorization Type [employer] MCR    Authorization Time Period 07/28/2024 - 10/26/2024    Authorization - Visit Number 2    Authorization - Number of Visits 8    Progress Note Due on Visit 20    PT Start Time 1017    PT Stop Time 1100    PT Time Calculation (min) 43 min    Activity Tolerance Patient tolerated treatment well    Behavior During Therapy [facility] for tasks assessed/performed                   Past Medical History:  Diagnosis Date   Abrasion of elbow, right 12/29/2015   Anxiety    Asthma    daily and prn inhalers   Breast discharge    right bloody x 2 months   High cholesterol    takes lipitor   HOCM (hypertrophic obstructive cardiomyopathy) (HCC)    Hypertension    states under control with med., has been on med. x years   Left bundle branch block (LBBB)    Leg skin lesion, left    mole removed 12/27/2015   Melanoma (HCC)    Nipple lesion 12/2015   right nipple mass   Parsonage-Turner syndrome 02/02/2016   right   PONV (postoperative nausea and vomiting)    Past Surgical History:  Procedure Laterality Date   APPENDECTOMY     age 73   BREAST BIOPSY Right 05/22/2022   US  RT BREAST BX W LOC DEV 1ST LESION IMG BX SPEC US  GUIDE 05/22/2022 [facility] MAMMOGRAPHY   BREAST DUCTAL SYSTEM EXCISION Right 02/19/2017   Procedure: RIGHT BREAST CENTRAL DUCT EXCISION;  Surgeon: Vanderbilt Ned, MD;  Location: [facility];  Service: General;  Laterality: Right;   BREAST EXCISIONAL BIOPSY Right 2017   CARDIAC SURGERY  03/09/2014   septal myectomy    HYSTEROSCOPY WITH D & C  07/21/2004   submucosal myomectomy   INFERIOR OBLIQUE MYECTOMY  2015   MASS EXCISION Right 01/04/2016   Procedure: EXCISION OF 1CM RIGHT NIPPLE MASS;  Surgeon: Ned Vanderbilt, MD;  Location: [facility];  Service: General;  Laterality: Right;   MITRAL VALVE REPAIR  03/09/2014   NAILBED REPAIR Right 10/14/2012   Procedure: BIOPSY NAIL MATRIX RIGHT THUMB;  Surgeon: Arley JONELLE Curia, MD;  Location:  [facility];  Service: Orthopedics;  Laterality: Right;   SALPINGOOPHORECTOMY  2017   TEE WITHOUT CARDIOVERSION N/A 11/10/2013   Procedure: TRANSESOPHAGEAL ECHOCARDIOGRAM (TEE);  Surgeon: Leim VEAR Moose, MD;  Location: [facility];  Service: Cardiovascular;  Laterality: N/A;   Patient Active Problem List   Diagnosis Date Noted   Parsonage-Turner syndrome 02/02/2016   Hypertrophic obstructive cardiomyopathy (HCC) 12/26/2015   Dyspnea 11/29/2014   Breath shortness 11/29/2014   S/P ventricular septal myectomy 04/06/2014   S/P MVR (mitral valve repair) 04/06/2014   LBBB (left bundle branch block)    HOCM (hypertrophic obstructive cardiomyopathy) (HCC)    Dynamic left ventricular outflow obstruction 11/19/2013   PVC's (premature ventricular contractions)  Mitral insufficiency 11/06/2013   Hypertension    MVP (mitral valve prolapse)    Dysrhythmia     PCP: [facility]  REFERRING PROVIDER: Kit Rush, MD  REFERRING DIAG: Achilles tendinitis, right leg; Achilles tendinitis, left leg  THERAPY DIAG:  Pain in left ankle and joints of left foot  Pain in right ankle and joints of right foot  Muscle weakness (generalized)  Rationale for Evaluation and Treatment: Rehabilitation  ONSET DATE: 03/23/2024   SUBJECTIVE:  SUBJECTIVE STATEMENT: Patient reports she is doing well, she has been doing her exercises and pushes through pain on the left side.   Eval: Patient reports achilles pain on both sides that is worse on the  left. She has been on steroids and reduced her walking so is feeling better now, and she is wearing compression socks. She has been having this particular pain for about a month, but had this same pain years ago that just seemed to just get better. She did start walking a couple miles which may have flared up her current pain.  PERTINENT HISTORY: See PMH above  PAIN:  Are you having pain? Yes:  NPRS scale: 0/10 at rest, 2/10 with walking, 4/10 at worst with exercises Pain location: Left achilles Pain description: Sharp Aggravating factors: Walking, going down stairs Relieving factors: Rest, medication, icing  PRECAUTIONS: None  PATIENT GOALS: Improve pain to get back to walking   OBJECTIVE:  Note: Objective measures were completed at Evaluation unless otherwise noted. PATIENT SURVEYS:  PSFS: 4 Walking for exercise: 3 Going down stairs: 5  06/16/2024:  PSFS: 6.5 Walking for exercise: 8 Going down stairs: 5  07/21/2024:  PSFS: 7 Walking for exercise: 8 Going down stairs: 6  08/14/2024: PSFS: 7 Walking for exercise: 8 Going down stairs: 6  MUSCLE LENGTH: Calf tightness noted bilaterally  POSTURE:   Grossly WFL  PALPATION: Tender to palpation R>L mid portion achilles, non-tender to insertion or calf  LOWER EXTREMITY ROM:  Active ROM Right eval Left eval Rt / Lt 06/16/2024 Rt / Lt 08/14/2024  Hip flexion      Hip extension      Hip abduction      Hip adduction      Hip internal rotation      Hip external rotation      Knee flexion      Knee extension      Ankle dorsiflexion 8 5 10  / 10 12 / 12  Ankle plantarflexion    53 / 50  Ankle inversion      Ankle eversion       (Blank rows = not tested)  LOWER EXTREMITY MMT:  MMT Right eval Left eval Rt / Lt 05/06/2024 Rt / Lt 06/16/2024 Rt / Lt 07/21/2024 Rt / Lt 08/14/2024  Hip flexion        Hip extension        Hip abduction        Hip adduction        Hip internal rotation        Hip external rotation         Knee flexion        Knee extension        Ankle dorsiflexion 5 5    5  / 5  Ankle plantarflexion 4 3 4  / 4- 4 / 4- 4 / 4 4+ / 4  Ankle inversion 4 4    5  / 5  Ankle eversion 4 4  5 / 5   (Blank rows = not tested)  FUNCTIONAL TESTS:  Not formally assessed  04/29/2024: SLS: right 16 seconds, left 22 seconds  06/16/2024: SLS: 30 sec each   GAIT: Assistive device utilized: None Level of assistance: Complete Independence Comments: Grossly WFL                                                                                                                               TREATMENT  [facility] Adult PT Treatment:                                                DATE: 08/14/2024 Recumbent bike L4 x 5 min to improve endurance and workload capacity Standing eccentric SL heel raise 3 x 10 each Forward heel tap from 4 box 3 x 10 each Seated SL heel raise with 50# 3 x 10 each Goblet squat to table tap with 15# 3 x 10  Spent time discussing achilles and pain and when it is ok to push through specific pain using stop light 0-10 pain analogy.   PATIENT EDUCATION:  Education details: POC extension, HEP Person educated: Patient Education method: Explanation, Demonstration, Tactile cues, Verbal cues Education comprehension: verbalized understanding, returned demonstration, verbal cues required, tactile cues required, and needs further education  HOME EXERCISE PROGRAM: Access Code: [email or web address]    ASSESSMENT: CLINICAL IMPRESSION: Patient tolerated therapy well with no adverse effects. She does continue to demonstrate some weakness of the right calf/achilles and with pain to the mid portion of achilles with activity. Overall she does report improvement but does continue to report limitations in her functional ability mainly with walking and stairs. Therapy continues to focus on progressing strength and load capacity for the calf/achilles. She did report feeling less pain with exercises this visit and  incorporated more LE strengthening with squats. No changes made to her HEP this visit. Patient would benefit from continued skilled PT to progress mobility and strength in order to reduce pain and maximize functional ability, so will extend PT POC for 6 more weeks.   Eval: Patient is a 74 y.o. female who was seen today for physical therapy evaluation and treatment for bilateral achilles pain that seems consistent with mid portion achilles tendinopathy. She exhibits limitations in her calf flexibility and ankle strength bilaterally that is likely contributing to her pain and decreased activity tolerance.   OBJECTIVE IMPAIRMENTS: decreased activity tolerance, decreased ROM, decreased strength, impaired flexibility, and pain.   ACTIVITY LIMITATIONS: standing, stairs, and locomotion level  PARTICIPATION LIMITATIONS: shopping and community activity  PERSONAL FACTORS: Fitness, Past/current experiences, and Time since onset of injury/illness/exacerbation are also affecting patient's functional outcome.    GOALS: Goals reviewed with patient? Yes  SHORT TERM GOALS: Target date: 09/11/2024  Patient will be I with initial HEP in order to  progress with therapy. Baseline: HEP provided at eval 05/27/2024: independent Goal status: MET  2.  Patient will report bilateral achilles pain </= 1/10 in order to reduce functional limitations Baseline: 3/10 05/27/2024: pain up to 3/10 06/16/2024: pain up to 3/10 07/21/2024: pain 3-4/10 08/14/2024: pain 4/10 at worst Goal status: ONGOING  LONG TERM GOALS: Target date: 08/11/2024  Patient will be I with final HEP to maintain progress from PT. Baseline: HEP provided at eval 06/16/2024: progressing 07/21/2024: progressing 08/14/2024: progressing Goal status: ONGOING  2.  Patient will report PSFS >/= 8 in order to indicate improvement in their functional ability. Baseline: 4 06/16/2024: 6.5 07/21/2024: 7 08/14/2024: 7 Goal status: ONGOING  3.  Patient will  demonstrate bilateral ankle strength 5/5 MMT in order to improve her walking tolerance Baseline: see limitations above 06/16/2024: see limitations above 07/21/2024: see limitations above 08/14/2024: see limitations above Goal status: ONGOING  4.  Patient will demonstrate bilateral ankle DF >/= 10 deg in order to improve tolerance for going down stairs Baseline: see limitations above 06/16/2024: 10 deg 08/14/2024: see above Goal status: MET   PLAN: PT FREQUENCY: 1x/week  PT DURATION: 6 weeks  PLANNED INTERVENTIONS: 97164- PT Re-evaluation, 97750- Physical Performance Testing, 97110-Therapeutic exercises, 97530- Therapeutic activity, 97112- Neuromuscular re-education, 97535- Self Care, 02859- Manual therapy, [phone]- Gait training, [phone]- Ionotophoresis 4mg /ml Dexamethasone , Patient/Family education, Balance training, Stair training, Taping, Joint mobilization, Joint manipulation, Cryotherapy, and Moist heat  PLAN FOR NEXT SESSION: Review HEP and progress PRN, manual and stretching for calf, progress ankle strength and achilles load tolerance, balance training, incorporate LE and hip strengthening   Elaine Daring, PT, DPT, LAT, ATC 08/14/24  11:04 AM Phone: [phone] Fax: [phone]   "

## 2024-08-20 ENCOUNTER — Encounter: Admitting: Physical Therapy

## 2024-08-25 ENCOUNTER — Encounter: Admitting: Physical Therapy

## 2024-09-14 ENCOUNTER — Inpatient Hospital Stay

## 2024-09-14 ENCOUNTER — Inpatient Hospital Stay: Admitting: Oncology

## 2024-09-29 ENCOUNTER — Inpatient Hospital Stay: Admitting: Oncology

## 2024-09-29 ENCOUNTER — Inpatient Hospital Stay

## 2024-10-01 ENCOUNTER — Ambulatory Visit: Admitting: Cardiology
